# Patient Record
Sex: Male | Born: 1960 | Race: Black or African American | Hispanic: No | Marital: Single | Smoking: Former smoker
Health system: Southern US, Community
[De-identification: ages and names within clinical notes are randomized; demographics above are authoritative.]

## PROBLEM LIST (undated history)

## (undated) DIAGNOSIS — M25569 Pain in unspecified knee: Secondary | ICD-10-CM

## (undated) DIAGNOSIS — K5790 Diverticulosis of intestine, part unspecified, without perforation or abscess without bleeding: Secondary | ICD-10-CM

## (undated) DIAGNOSIS — D135 Benign neoplasm of extrahepatic bile ducts: Secondary | ICD-10-CM

## (undated) DIAGNOSIS — U071 COVID-19: Secondary | ICD-10-CM

## (undated) DIAGNOSIS — J45909 Unspecified asthma, uncomplicated: Secondary | ICD-10-CM

## (undated) DIAGNOSIS — K219 Gastro-esophageal reflux disease without esophagitis: Secondary | ICD-10-CM

## (undated) HISTORY — PX: KNEE ARTHROSCOPY: SHX127

## (undated) HISTORY — DX: Benign neoplasm of extrahepatic bile ducts: D13.5

## (undated) HISTORY — DX: Diverticulosis of intestine, part unspecified, without perforation or abscess without bleeding: K57.90

---

## 1999-02-07 ENCOUNTER — Emergency Department (HOSPITAL_COMMUNITY): Admission: EM | Admit: 1999-02-07 | Discharge: 1999-02-07 | Payer: Self-pay | Admitting: Emergency Medicine

## 1999-02-07 ENCOUNTER — Encounter: Payer: Self-pay | Admitting: Emergency Medicine

## 2000-09-09 ENCOUNTER — Encounter: Payer: Self-pay | Admitting: Emergency Medicine

## 2000-09-09 ENCOUNTER — Emergency Department (HOSPITAL_COMMUNITY): Admission: EM | Admit: 2000-09-09 | Discharge: 2000-09-09 | Payer: Self-pay | Admitting: *Deleted

## 2000-10-18 ENCOUNTER — Encounter: Admission: RE | Admit: 2000-10-18 | Discharge: 2000-10-18 | Payer: Self-pay | Admitting: Orthopedic Surgery

## 2000-10-18 ENCOUNTER — Encounter: Payer: Self-pay | Admitting: Orthopedic Surgery

## 2001-07-11 ENCOUNTER — Emergency Department (HOSPITAL_COMMUNITY): Admission: EM | Admit: 2001-07-11 | Discharge: 2001-07-11 | Payer: Self-pay | Admitting: Emergency Medicine

## 2001-10-18 ENCOUNTER — Encounter: Payer: Self-pay | Admitting: Gastroenterology

## 2001-10-18 ENCOUNTER — Inpatient Hospital Stay (HOSPITAL_COMMUNITY): Admission: EM | Admit: 2001-10-18 | Discharge: 2001-10-23 | Payer: Self-pay | Admitting: Emergency Medicine

## 2002-06-13 ENCOUNTER — Emergency Department (HOSPITAL_COMMUNITY): Admission: EM | Admit: 2002-06-13 | Discharge: 2002-06-14 | Payer: Self-pay | Admitting: Emergency Medicine

## 2004-02-22 ENCOUNTER — Emergency Department (HOSPITAL_COMMUNITY): Admission: EM | Admit: 2004-02-22 | Discharge: 2004-02-22 | Payer: Self-pay | Admitting: Emergency Medicine

## 2005-03-26 ENCOUNTER — Emergency Department (HOSPITAL_COMMUNITY): Admission: EM | Admit: 2005-03-26 | Discharge: 2005-03-26 | Payer: Self-pay | Admitting: *Deleted

## 2006-02-18 ENCOUNTER — Inpatient Hospital Stay (HOSPITAL_COMMUNITY): Admission: EM | Admit: 2006-02-18 | Discharge: 2006-02-21 | Payer: Self-pay | Admitting: Emergency Medicine

## 2006-02-18 ENCOUNTER — Ambulatory Visit: Payer: Self-pay | Admitting: Internal Medicine

## 2006-02-27 ENCOUNTER — Ambulatory Visit: Payer: Self-pay | Admitting: Internal Medicine

## 2006-08-19 ENCOUNTER — Observation Stay (HOSPITAL_COMMUNITY): Admission: EM | Admit: 2006-08-19 | Discharge: 2006-08-20 | Payer: Self-pay | Admitting: Emergency Medicine

## 2006-09-10 ENCOUNTER — Emergency Department (HOSPITAL_COMMUNITY): Admission: EM | Admit: 2006-09-10 | Discharge: 2006-09-10 | Payer: Self-pay | Admitting: Emergency Medicine

## 2006-10-04 ENCOUNTER — Emergency Department (HOSPITAL_COMMUNITY): Admission: EM | Admit: 2006-10-04 | Discharge: 2006-10-04 | Payer: Self-pay | Admitting: Emergency Medicine

## 2007-03-03 ENCOUNTER — Emergency Department (HOSPITAL_COMMUNITY): Admission: EM | Admit: 2007-03-03 | Discharge: 2007-03-03 | Payer: Self-pay | Admitting: Emergency Medicine

## 2007-06-27 ENCOUNTER — Ambulatory Visit: Payer: Self-pay | Admitting: Internal Medicine

## 2007-07-15 ENCOUNTER — Ambulatory Visit: Payer: Self-pay | Admitting: Internal Medicine

## 2007-07-15 LAB — CONVERTED CEMR LAB
ALT: 15 units/L (ref 0–53)
Albumin: 4.4 g/dL (ref 3.5–5.2)
BUN: 15 mg/dL (ref 6–23)
HDL: 40 mg/dL (ref >39)
Sodium: 138 meq/L (ref 135–145)
Total Bilirubin: 0.4 mg/dL (ref 0.3–1.2)
Total Protein: 8.2 g/dL (ref 6.0–8.3)
Triglycerides: 209 mg/dL — ABNORMAL HIGH (ref <150)
VLDL: 42 mg/dL — ABNORMAL HIGH (ref 0–40)

## 2007-07-16 ENCOUNTER — Ambulatory Visit: Payer: Self-pay | Admitting: *Deleted

## 2007-11-17 ENCOUNTER — Emergency Department (HOSPITAL_COMMUNITY): Admission: EM | Admit: 2007-11-17 | Discharge: 2007-11-17 | Payer: Self-pay | Admitting: Physician Assistant

## 2007-12-25 ENCOUNTER — Ambulatory Visit: Payer: Self-pay | Admitting: Internal Medicine

## 2007-12-26 ENCOUNTER — Ambulatory Visit (HOSPITAL_COMMUNITY): Admission: RE | Admit: 2007-12-26 | Discharge: 2007-12-26 | Payer: Self-pay | Admitting: Internal Medicine

## 2008-07-06 ENCOUNTER — Emergency Department (HOSPITAL_COMMUNITY): Admission: EM | Admit: 2008-07-06 | Discharge: 2008-07-06 | Payer: Self-pay | Admitting: Emergency Medicine

## 2008-08-03 ENCOUNTER — Ambulatory Visit: Payer: Self-pay | Admitting: Internal Medicine

## 2008-09-29 ENCOUNTER — Ambulatory Visit: Payer: Self-pay | Admitting: Internal Medicine

## 2009-02-27 ENCOUNTER — Emergency Department (HOSPITAL_COMMUNITY): Admission: EM | Admit: 2009-02-27 | Discharge: 2009-02-27 | Payer: Self-pay | Admitting: Emergency Medicine

## 2009-03-28 ENCOUNTER — Emergency Department (HOSPITAL_COMMUNITY): Admission: EM | Admit: 2009-03-28 | Discharge: 2009-03-28 | Payer: Self-pay | Admitting: Emergency Medicine

## 2009-07-21 ENCOUNTER — Emergency Department (HOSPITAL_COMMUNITY): Admission: EM | Admit: 2009-07-21 | Discharge: 2009-07-22 | Payer: Self-pay | Admitting: Emergency Medicine

## 2009-12-18 ENCOUNTER — Emergency Department (HOSPITAL_COMMUNITY): Admission: EM | Admit: 2009-12-18 | Discharge: 2009-12-18 | Payer: Self-pay | Admitting: Emergency Medicine

## 2009-12-21 ENCOUNTER — Emergency Department (HOSPITAL_COMMUNITY): Admission: EM | Admit: 2009-12-21 | Discharge: 2009-12-21 | Payer: Self-pay | Admitting: Emergency Medicine

## 2010-10-03 ENCOUNTER — Encounter: Payer: Self-pay | Admitting: Emergency Medicine

## 2010-12-11 ENCOUNTER — Emergency Department (HOSPITAL_COMMUNITY)
Admission: EM | Admit: 2010-12-11 | Discharge: 2010-12-12 | Disposition: A | Discharge status: Home / Self Care | Payer: Self-pay | Attending: Emergency Medicine | Admitting: Emergency Medicine

## 2010-12-11 DIAGNOSIS — L0231 Cutaneous abscess of buttock: Secondary | ICD-10-CM | POA: Insufficient documentation

## 2010-12-14 LAB — WOUND CULTURE

## 2010-12-18 LAB — BASIC METABOLIC PANEL
CO2: 30 mEq/L (ref 19–32)
Chloride: 103 mEq/L (ref 96–112)
GFR calc Af Amer: >60 mL/min (ref >60)
Sodium: 139 mEq/L (ref 135–145)

## 2010-12-18 LAB — CBC
MCV: 92.6 fL (ref 78.0–100.0)
RBC: 4.53 MIL/uL (ref 4.22–5.81)
RDW: 12.8 % (ref 11.5–15.5)

## 2010-12-18 LAB — DIFFERENTIAL
Basophils Absolute: 0 10*3/uL (ref 0.0–0.1)
Eosinophils Relative: 2 % (ref 0–5)
Neutro Abs: 3.9 10*3/uL (ref 1.7–7.7)
Neutrophils Relative %: 55 % (ref 43–77)

## 2010-12-18 LAB — POCT CARDIAC MARKERS
CKMB, poc: <1 ng/mL — ABNORMAL LOW (ref 1.0–8.0)
Myoglobin, poc: 33.2 ng/mL (ref 12–200)

## 2011-01-27 NOTE — H&P (Signed)
Zachary, [name] NO.:  [id number]   MEDICAL RECORD NO.:  [id number]          PATIENT TYPE:  INP   LOCATION:  2007                         FACILITY:  [facility]   PHYSICIAN:  Vesta Mixer, M.D. DATE OF BIRTH:  10-Oct-1960   DATE OF ADMISSION:  08/19/2006  DATE OF DISCHARGE:                              HISTORY & PHYSICAL   Zachary [name] is a 50 year old gentleman with history of chest pains  in the past.  He was admitted through the emergency room with a code  STEMI.   Zachary [name] has a history of chest pains in the past.  He was seen by Dr.  Gala Romney this past summer.  He had a stress Cardiolite study which was  negative for ischemia.   The patient presented to the emergency room with a very vague history of  four days of atypical chest pain.  He states that the pain has been with  him for at least three to four days, although he could remember when it  started.  It was not all that severe.  He denies any shortness breath.  He used to smoke but quit smoking in June.  He does not get any regular  exercise, so could not comment on the exertional component.   An EKG revealed what appeared to be early repolarization changes.  These  changes were not present on an EKG in June and a code STEMI was called.   The patient denies any syncope or presyncope.  He denies any fever or  sputum production.   CURRENT MEDICATIONS:  None.   ALLERGIES:  None.   PAST MEDICAL HISTORY:  1. History of knee surgery.  2. History of chest pain.  He was seen by Dr. Gala Romney and had a      negative Cardiolite study in June 2007.   SOCIAL HISTORY:  The patient used to smoke but quit in June of 2007.  His family history is unobtainable.   REVIEW OF SYSTEMS:  The patient is a very vague historian.   On exam, he is a young gentleman in no acute distress.  He is alert and  oriented x3 and is mood and affect are normal.  His blood pressure is  162/84 with heart rate of 64.  His  HEENT exam reveals 2+ carotids.  He  has no bruits, no JVD, no thyromegaly.  Lungs are clear to auscultation.  Heart regular rate, S1-S2, no murmurs.  Abdominal exam reveals good  bowel sounds and is nontender.  Extremities has no clubbing, cyanosis or  edema.  Neuro exam is nonfocal.   His EKG reveals normal sinus rhythm.  He has early repolarization  changes.  His old EKG reveals normal sinus rhythm with T-wave inversions  in the inferior leads.   His laboratory data is pending.   Zachary [name] is admitted with a four-day history of vague chest pain.  Code ST-segment elevation myocardial infarction was called.  We will  admit him for further evaluation and possible heart catheterization.  We  have discussed the risks, benefits and options of heart catheterization.  He understands and agrees  to proceed.           ______________________________  Vesta Mixer, M.D.     PJN/MEDQ  D:  08/19/2006  T:  08/20/2006  Job:  [id number]   cc:   Dala Dock

## 2011-01-27 NOTE — Discharge Summary (Signed)
[place]  Patient:    Zachary [name], Zachary [name] Visit Number: [id number] MRN: [id number]          Service Type: MED Location: 2000 2002 01 Attending Physician:  Zachary [name] Dictated by:   Zachary [name], P.A.-C. Admit Date:  10/18/2001 Disc. Date: 10/23/01   CC:         Zachary [name], M.D. [facility]   Referring Physician Discharge [place]  DATE OF BIRTH:  [birth year]  SUMMARY OF HISTORY:  Zachary [name] is a 50 year old black male, who presented to the emergency room for evaluation of epigastric discomfort associated with nausea, vomiting, and shortness of breath.  He has felt that his symptoms had worsened over the last few days.  He also describes a productive cough of clear to yellow sputum but denies hemoptysis.  He was seen at [facility] Emergency Room 3-4 days prior to this presentation and diagnosed with bronchitis and placed on amoxicillin.  He has had developed diarrhea since that time.  He also describes orthopnea and PND.  His symptoms seem to be worse with exertion and with eating and improved with rest.  He was seen at Urgent Care today and referred to the emergency room secondary to EKG changes. He quit smoking approximately one week ago secondary to shortness of breath. He drinks at least 3-4 beers per day and denies drug use.  LABORATORY DATA:  C-reactive protein was .7.  C. difficile is still pending at this dictation.  Ferritin 250.  Lipids show a total cholesterol of 139, triglycerides 48, HDL 42, LDL 87.  CK totals were slightly elevated with negative MBs and relative indexes. Troponins were also slightly elevated at .22, .19, and .17.  Admission sodium was 140, potassium 4.1, BUN 13, creatinine 1.3, AST and ALT were slightly elevated at 56 and 78.  Magnesium was 1.9, amylase 32, lipase 16.  Subsequent chemistries were essentially unremarkable.  Admission H&H was 15.8 and 47.2, normal indices, platelets 253, WBC 4.3.  PT 15.6,  PTT 31.  ABG showed a pH of 7.33, pCO2 45.7, and a pO2 of 32, with O2 saturations 57 (probably venous sample).  Chest x-ray on admission showed cardiomegaly compared to Feb 07, 1999.  Mild increased perihilar markings suggesting either early edema or interstitial pneumonitis.  Echocardiogram performed on October 18, 2001, showed an EF of approximately 15%, mild left atrial dilatation, right ventricle dilatation, extreme LV dysfunction, and RV dysfunction.  EKG showed normal sinus rhythm, poor R wave progression, left atrial enlargement.  Subsequent EKGs showed improvement of R wave progression.  HOSPITAL COURSE:  Zachary [name] was admitted to the unit 2000 to be treated for congestive heart failure, possible ischemia.  Echocardiogram was obtained.  He was placed on medications for diuresis.  Echocardiogram showed an EF of 10-15%, and Zachary [name] discussed these results with the patient and his mother and possible etiologies.  It was noted that the patient does admit to binge drinking a couple of times per week, a 12-pack and liquor.  It was felt that his troponins were slightly increased secondary to CHF.  Medications were adjusted.  Cardiac catheterization was performed on October 21, 2001.  This did not show any coronary artery disease.  Zachary [name] notes on October 22, 2001, that she discussed with Zachary [name] about his nonischemic cardiomyopathy.  No history of syncope or widened QRS.  Thus, his CHF will be treated medically without any indication for EP evaluation at this point. October 23, 2001, Zachary [name] saw the patient and felt that he could be discharged home.  DISCHARGE DIAGNOSES: 1. Dilated cardiomyopathy. 2. Congestive heart failure secondary to above. 3. Alcohol use. 4. Remote tobacco use.  DISPOSITION:  He is discharged home.  DISCHARGE MEDICATIONS: 1. Lasix 60 mg q.d. 2. Mavik 1 mg q.d. 3. Toprol XL 35 mg q.d. 4. Aldactone 25 mg q.d. 5. He was instructed he  could continue his Prevacid 30 mg q.d. and albuterol    as previously.  INSTRUCTIONS:  He was advised no lifting, driving, sexual activity, or heavy exertion for two days.  Maintain low salt diet, less than 2000 mg per day, low fat, low cholesterol diet.  If he had any problems with the catheterization site, he was asked to call immediately.  He was advised no alcohol or tobacco use whatsoever.  He was instructed to weigh daily, write these down, and bring his medications and weights to all office appointments.  He will have a BNP drawn the week of November 11, 2001, at our office, and will see Zachary [name] on Thursday, November 14, 2001, at 11:15. Dictated by:   Zachary [name], P.A.-C. Attending Physician:  Zachary [name] DD:  10/23/01 TD:  10/23/01 Job: [id number]

## 2011-01-27 NOTE — H&P (Signed)
NAME:  Zachary [name], Zachary [name]            ACCOUNT NO.:  [id number]   MEDICAL RECORD NO.:  [id number]          PATIENT TYPE:  INP   LOCATION:  1824                         FACILITY:  [facility]   PHYSICIAN:  Della Goo, M.D. DATE OF BIRTH:  10-Feb-1961   DATE OF ADMISSION:  02/18/2006  DATE OF DISCHARGE:                                HISTORY & PHYSICAL   PRIMARY CARE PHYSICIAN:  Unassigned.  This is an admission to the [facility]  Hospitalist group team A.   CHIEF COMPLAINT:  Chest pain.   HISTORY OF PRESENT ILLNESS:  This is a 50 year old male with a remote  history of asthma who presented to the emergency department secondary to  complaints of chest pain radiating into his left arm.  The patient reports  this pain began while he was at a picnic.  The pain was associated with  exertion.  The pain radiated up and down his left arm.  He denies any  association of nausea, vomiting, diaphoresis or shortness of breath  associated with the pain.  He reports the pain was dull, substernal and  rated at a quality of 6/10.  He reports the pain lasted two hours and did  not resolve until he arrived in the emergency department and was  administered nitroglycerin x1.   The patient does have a family history of CAD.  His father and his maternal  grandfather had coronary artery disease.  The patient also reports other  members of his mother's family as well have coronary artery disease,  hypertension and diabetes.  He is a smoker; smokes one pack per day for over  32 years.   PAST MEDICAL HISTORY:  As mentioned above, asthma.   MEDICATIONS:  None.   ALLERGIES:  No known drug allergies.   SOCIAL HISTORY:  The patient lives with his father.  Works as a Designer, fashion/clothing.  Positive tobacco one pack per day x32 years.  Quit alcohol several years  ago.  Reports drinking approximately a case per day of beer.   FAMILY HISTORY:  Mentioned above.  Also sister with diabetes and cancer  history in maternal  grandfather, unknown type.   REVIEW OF SYSTEMS:  Pertinent as mentioned above.   PHYSICAL EXAMINATION:  GENERAL:  This is a thin, well-developed, well-  nourished male in no discomfort and in no acute distress at this time.  VITAL SIGNS:  Temperature 98.3, blood pressure 127/81, heart rate 70 and  respirations 18.  O2 saturation 94% on room air.  HEENT:  Normocephalic and atraumatic.  Pupils are equal, round and reactive  to light.  Extraocular muscles are intact.  No scleral icterus.  Funduscopic  examination has no exudate, hemorrhages or AV nicking.  Tympanic membranes  clear bilaterally.  Oropharynx clear.  Sparse lower mandibular dentition.  Edentulous on the upper palate.  NECK:  Supple.  Full range of motion.  No jugular venous distention.  No  carotid bruits.  No adenopathy.  No thyromegaly.  CHEST:  Chest wall examination has a nondisplaced PMI.  No chest wall  tenderness.  Regular rate and rhythm.  Mild bradycardia.  No murmurs,  gallops, or rubs.  LUNGS:  Clear to auscultation with occasional expiratory wheezes.  ABDOMEN:  Positive bowel sounds.  Soft, nontender, and nondistended.  Scaphoid abdomen.  No hepatosplenomegaly.  RECTAL/GENITOURINARY:  Deferred.  BACK:  No CVA tenderness.  No spinous process tenderness.  EXTREMITIES:  No clubbing, cyanosis, or edema.  MUSCULOSKELETAL:  5/5 strength throughout.  Full range of motion.  No  atrophy.  VASCULAR:  Peripheral pulses 2+ and symmetric.  NEUROLOGICAL:  The patient is alert and oriented x3.  Cranial nerves II  through XII intact.  There are no focal deficits.  Motor and sensory  function intact.  Cerebellar function intact.  Deep tendon reflexes are 2/4  bilaterally.   LABORATORY DATA:  Hemoglobin 16.3, hematocrit 48.0.  Chemistry revealing a  sodium of 139, potassium 3.5, chloride 106, BUN 9, bicarb 26, creatinine  1.0.  Cardiac enzymes:  Myoglobin 58.9, CK-MB 1.2, troponin less than 0.05.  Glucose 94.  EKG reveals a  sinus bradycardia.  No acute ST segment changes.  Chest x-ray findings are negative for any acute infiltrates, effusions or  masses.  There is mild bronchial thickening apparent.   ASSESSMENT:  1.  Chest pain:  This is a 50 year old male with risk factors for coronary      artery disease who had chest pain unrelieved until administration of      nitroglycerin x1 today in the emergency department.  The patient has      been admitted for rule out and acute myocardial event.  2.  Asthma.  3.  Tobacco abuse.   PLAN:  The patient has been admitted to telemetry for cardiac monitoring.  Cardiac enzymes will be performed q.8h. x3.  An EKG will also be performed  in the a.m.  The patient will be placed on aspirin therapy, nitroglycerin  therapy along with Lovenox therapy until enzymes return negative x3.  The  patient will be placed on GI prophylaxis with Protonix.  The patient has  been placed on Xopenex nebulizer treatments for his asthma.  The patient  will be placed on a nicotine patch while hospitalized.  He has been  counseled in regard to his smoking.      Della Goo, M.D.  Electronically Signed     HJ/MEDQ  D:  02/18/2006  T:  02/18/2006  Job:  [id number]

## 2011-01-27 NOTE — Cardiovascular Report (Signed)
NAME:  [name], [name] NO.:  [id number]   MEDICAL RECORD NO.:  [id number]          PATIENT TYPE:  INP   LOCATION:  2007                         FACILITY:  [facility]   PHYSICIAN:  Vesta Mixer, M.D. DATE OF BIRTH:  [birth year]   DATE OF PROCEDURE:  08/19/2006  DATE OF DISCHARGE:                            CARDIAC CATHETERIZATION   Zachary [name] is a 50 year old gentleman with a history of chest  pains in the past.  He had a stress Cardiolite study in June which was  negative.  He saw Dr. Gala Romney at the time.   He presented today with a 4-day history of intermittent chest pain.  The  symptoms were quite vague.  He had an EKG which revealed mild ST-segment  depression which appeared to be most consistent with early  repolarization.  These changes were however new from his June EKG which  revealed T-wave inversions in the inferior leads.   Because of these changes, he was referred for heart catheterization and  possible angioplasty.   The risks, benefits and options were explained to the patient.  He  understands and agrees to proceed.   The procedure was a left heart catheterization with coronary  angiography.  The right femoral artery was easily cannulated using the  modified Seldinger technique.   HEMODYNAMICS:  The LV pressure is 139/3 with an aortic pressure of  140/81.   ANGIOGRAPHY:  Left main:  The left main is smooth and normal.   The left anterior descending artery is smooth and normal.  The first  diagonal artery is a moderate-sized branch which is also normal.  The  second diagonal branch is a little bit larger branch which is also  normal.   The left circumflex artery is a fairly large system.  It gives off a  very high obtuse marginal artery which is normal.  The remainder of the  left circumflex artery is fairly normal.   The right coronary artery is smooth and normal and is dominant.  The  posterior descending artery and the  posterolateral segment artery are  normal.   COMPLICATIONS:  None.   CONCLUSIONS:  1. Fairly smooth and normal coronary arteries.  2. Normal left ventricular systolic function.  The patient will be      ready for discharge tomorrow.  We will observe him overnight.           ______________________________  Vesta Mixer, M.D.     PJN/MEDQ  D:  08/19/2006  T:  08/20/2006  Job:  [id number]   cc:   Dala Dock

## 2011-01-27 NOTE — Discharge Summary (Signed)
Zachary [name], Zachary [name]            ACCOUNT NO.:  [id number]   MEDICAL RECORD NO.:  [id number]          PATIENT TYPE:  INP   LOCATION:  2007                         FACILITY:  [facility]   PHYSICIAN:  Vesta Mixer, M.D. DATE OF BIRTH:  [birth year]   DATE OF ADMISSION:  08/19/2006  DATE OF DISCHARGE:  08/20/2006                               DISCHARGE SUMMARY   DISCHARGE DIAGNOSIS:  Noncardiac chest pain.   DISCHARGE MEDICATIONS:  Aspirin 81 mg a day.   DISPOSITION:  The patient will follow up with [facility].   HISTORY OF PRESENT ILLNESS:  Zachary [name] is a 50 year old gentleman who  came to the ER with four days of intermittent chest pain. He was  admitted to the cardiology service with a code semi.   He had been complaining of four days of intermittent chest pain. The  chest pain was fairly vague and was somewhat atypical. His EKG showed  early repolarization changes.   HOSPITAL COURSE BY PROBLEMS:  Chest pain. The patient's chest pain was  quite vague. Code semi was called. He was taken to the cath lab. He was  found to have fairly smooth and normal coronary arteries. His left  ventricular systolic function was normal.   The patient did well and did not have any further episodes of chest  pain. Cardiac enzymes were negative x1. Further enzymes were not drawn  because he has smooth and normal coronary arteries and normal left  ventricular systolic function.   The patient will follow up at [facility]. He has been instructed to not  lift anything heavy for the next couple days.           ______________________________  Vesta Mixer, M.D.     PJN/MEDQ  D:  08/20/2006  T:  08/20/2006  Job:  [id number]   cc:   Benetta Spar R. Rankins, M.D.

## 2011-01-27 NOTE — Cardiovascular Report (Signed)
[place]. [facility]  Patient:    Zachary [name], Zachary [name] Visit Number: [id number] MRN: [id number]          Service Type: MED Location: 2000 2002 01 Attending Physician:  Pricilla Riffle Dictated by:   Lewayne Bunting, M.D. [facility] Proc. Date: 10/22/01 Admit Date:  10/18/2001   CC:         Dietrich Pates, M.D. [facility]   Cardiac Catheterization  DATE OF BIRTH:  [birth year].  REFERRING PHYSICIAN:  Dietrich Pates, M.D.  PROCEDURES PERFORMED: 1. Left and right heart catheterization. 2. Selective coronary angiography.  DIAGNOSES: 1. Nonischemic cardiomyopathy. 2. Low output state. 3. Markedly elevated left ventricular end-diastolic pressure. 4. Mild pulmonary hypertension.  INDICATIONS:  The patient is a 50 year old male with a history of alcohol use and tobacco use.  The patient has no prior history of coronary artery disease. He was admitted with shortness of breath and abdominal pain.  A 2-D echocardiographic study revealed severe left ventricular systolic dysfunction. The patient has now been referred for diagnostic catheterization to assess his coronary anatomy and to determine hemodynamics.  He also has had nonsustained ventricular tachycardia and will require EP evaluation.  DESCRIPTION OF PROCEDURE:  After informed consent was obtained, the patient was brought to the catheterization laboratory.  The right groin was sterilely prepped and draped.  One percent Lidocaine injected.  A 6-French arterial sheath was placed in the right femoral artery and a 7-French venous sheath was placed in the femoral vein according to the modified Seldinger technique.  Subsequently, a Swan-Ganz catheter was placed in the pulmonary artery and appropriate right-sided hemodynamics were obtained.  Concomitant left and right sided hemodynamics were obtained after a 6-French pigtail catheter was placed in the left ventricle.  Appropriate left-sided hemodynamics were then also obtained.   No ventriculography was performed due to the fact of the patients underlying renal insufficiency. Both the pigtail and the Swan-Ganz catheter were then removed.  Attention was then turned to placing a 6-French JL-4 and JR-4 catheter in the left and right ostium respectively.  Selective coronary angiography was performed in the various projections using manual injections of contrast.  At the termination of the procedure, all catheters and sheaths were removed and the patient was brought back to the holding area.  No complications were noted.  Adequate hemostasis was provided.  FINDINGS:  HEMODYNAMICS: 1. Right atrial pressure is 15 mmHg. 2. Right ventricular pressure is 42/10 mmHg. 3. PA pressure is 42/28 mmHg. 4. Pulmonary capillary wedge pressure:  A-wave 25 mmHg, V-wave 30 mmHg. 5. Left ventricular pressure is 108/30 mmHg. 6. Aortic pressure is 108/84 mmHg. 7. Cardiac output by thermodilution was 3.7 L/min. 8. Fick cardiac output was 3.0 L/min; Fick cardiac index was 1.7 L/min.  VENTRICULOGRAPHY:  Not performed.  SELECTIVE CORONARY ANGIOGRAPHY:  The coronary circulation was right dominant.  The left main coronary artery was a large caliber vessel free of flow limiting coronary artery disease.  The left anterior descending artery was a large caliber vessel giving rise to three diagonal branches, with the most proximal being the largest.  There was no evidence of flow limiting coronary artery disease.  The circumflex coronary artery was again a large caliber vessel with no evidence of flow limiting coronary artery disease.  The circumflex gave rise to a very large first obtuse marginal branch which was bifurcating towards the apex and providing blood flow to inferobasilar segment.  The circumflex proper also is free of flow limiting coronary artery  disease.  The right coronary artery was a large caliber vessel terminating in the posterior descending artery and a small  posterolateral branch.  There was no evidence of flow limiting coronary artery disease.  RECOMMENDATIONS:  Continued medical therapy is in order.  There is no evidence of flow limiting coronary artery disease.  The patient will need aggressive treatment of his heart failure symptoms.  As per Dr. Tenny Craw request, an EP evaluation has been ordered. Dictated by:   Lewayne Bunting, M.D. [facility] Attending Physician:  Pricilla Riffle DD:  10/22/01 TD:  10/22/01 Job: [id number]

## 2011-01-27 NOTE — Consult Note (Signed)
NAME:  Zachary [name], Zachary [name]            ACCOUNT NO.:  [id number]   MEDICAL RECORD NO.:  [id number]          PATIENT TYPE:  INP   LOCATION:  3731                         FACILITY:  [facility]   PHYSICIAN:  Arvilla Meres, M.D. LHCDATE OF BIRTH:  11/17/1960   DATE OF CONSULTATION:  02/19/2006  DATE OF DISCHARGE:                                   CONSULTATION   CARDIOLOGIST:  New to [facility].   REFERRING PHYSICIAN:  Isidor Holts, M.D.   REASON FOR CONSULTATION:  Chest pain.   HISTORY OF PRESENT ILLNESS:  [name] is a 50 year old male with no  known history of coronary artery disease.  He has never had a stress test or  cardiac catheterization.  He does have a history of asthma as well with  ongoing tobacco use.  He denies any history of diabetes, hypertension, or  hyperlipidemia.  He states that on Sunday, after smoking a cigarette, he  came back inside the house and developed some mild burning in his chest.  This was followed by tingling and burning in his left leg which radiated all  the way up his left side into his left arm.  He presented to the emergency  room. He was treated with nitroglycerin with resolution of his symptoms.  His EKG was normal, and subsequently he has had four sets of normal cardiac  enzymes.  He is quite active as a Designer, fashion/clothing and denies any exertional chest  pain or shortness of breath.  There has been no change in his functional  capacity.  He has not had any heart failure symptoms. There have been no  palpitations, syncope or presyncope.  He denies any significant reflux  disease.   REVIEW OF SYSTEMS:  As per History of Present Illness and Problem List.  Otherwise, all systems are negative.   PAST MEDICAL HISTORY:  1.  Asthma.  2.  Ongoing tobacco use.  3.  History of alcohol use but now quit.   There is no history of known coronary artery disease, diabetes,  hypertension, or hyperlipidemia.   MEDICATIONS ON ADMISSION:  None.   ALLERGIES:   None.   SOCIAL HISTORY:  He works as a Designer, fashion/clothing, lives in [place] with his father.  Tobacco: 1 pack per day x30 years.  He has a history of alcohol but quit.   FAMILY HISTORY:  Mother is alive in her 1s, recently diagnosed with  diabetes.  Father passed away in his 15s due to throat cancer.  One sister  who does not have a history of heart disease, one brother who is okay, one  brother who died from motor vehicle accident.  There was a maternal  grandfather who died from congestive heart failure.  There is no family  history of premature coronary artery disease.   PHYSICAL EXAMINATION:  GENERAL:  He is well-appearing in no acute distress.  He is lying flat in bed.  VITAL SIGNS: Blood pressure is 112/69. His heart rate is 70.  His  temperature is 99.5.  He is saturating 93% on room air.  HEENT:  Sclerae anicteric.  EOMI.  There is no  xanthelasmas.  Mucous  membranes are moist.  NECK: Supple.  There is no JVD.  Carotid 2+ bilaterally without bruits.  There is no lymphadenopathy or thyromegaly.  CARDIAC:  He is a regular rate and rhythm with no obvious murmurs, rubs or  gallops.  LUNGS: Have diffuse expiratory wheezing throughout.  ABDOMEN:  Soft, nontender, nondistended.  No hepatosplenomegaly, no bruits.  No masses.  Normal bowel sounds.  EXTREMITIES:  Warm with no cyanosis, clubbing or edema.  Femoral pulses are  2+ bilaterally without bruits.  DP pulses are 2+ bilaterally.  NEUROLOGIC: He is alert and oriented x3.  Cranial nerves II-XII are intact,  and he moves all four extremities without difficulty.   EKG shows normal sinus rhythm with no significant ST-T wave changes, a rate  of 69.   Labs show white count of 4.7, hemoglobin of 13.7, platelets 229.  INR is  1.1. Sodium is 138, potassium 3.5, BUN 8, creatinine 0.8, glucose 120.  Troponin is 0.02 x2. He also has two sets of negative point-of-care markers.  MBs are negative.  Total cholesterol is  163, triglycerides 98, HDL  37, LDL  106.   Chest x-ray shows no active cardiopulmonary disease.   ASSESSMENT:  1.  Chest pain, primarily atypical, though with response to nitroglycerin.      He has ruled out for myocardial infarction with serial cardiac enzymes      and a normal EKG.  2.  Asthma with ongoing wheezing on exam and a low-grade temperatures,      question chronic obstructive pulmonary disease flare versus asthmatic      bronchitis.  3.  No history of diabetes, hypertension, hyperlipidemia.   PLAN/DISCUSSION:  I suspect this is noncardiac.  However, given his response  to nitroglycerin and mild risk factors, I think he would deserve an  outpatient stress test, and we will schedule a treadmill Myoview for later  this week.  Would also recommend smoking cessation and using aspirin 81 mg a  day as well as treatment of his asthma/COPD.  We will provide you with the  date of the stress test in the chart.  We appreciate the consult, and please  do not hesitate to contact us with questions.  He would be stable for  discharge from our standpoint today.      Arvilla Meres, M.D. [facility]  Electronically Signed     DB/MEDQ  D:  02/19/2006  T:  02/19/2006  Job:  [id number]

## 2011-01-27 NOTE — Discharge Summary (Signed)
NAMETYE, VIGO            ACCOUNT NO.:  [id number]   MEDICAL RECORD NO.:  [id number]          PATIENT TYPE:  INP   LOCATION:  3731                         FACILITY:  [facility]   PHYSICIAN:  Isidor Holts, M.D.  DATE OF BIRTH:  May 24, 1961   DATE OF ADMISSION:  02/18/2006  DATE OF DISCHARGE:  02/21/2006                                 DISCHARGE SUMMARY   PRIMARY MEDICAL DOCTOR:  Gentry Fitz.   DISCHARGE DIAGNOSES:  1.  Atypical chest pain.  2.  Asthma/chronic obstructive pulmonary disease.  3.  Smoker.   DISCHARGE MEDICATIONS:  1.  Albuterol inhaler two puffs p.r.n. q.4-6 h.  2.  Advair Diskus (100/50) one puff b.i.d.   PROCEDURES:  1.  Two-view chest x-ray dated February 18, 2006:  This showed no acute disease,      possible minimal bronchial thickening.  2.  Stress Myoview dated February 20, 2006:  This showed slight fixed decreased      activity inferiorly with decreased motion and thickening in this area.      This could represent an areas of scar or diaphragmatic attenuation, no      ischemia, ejection fraction 52%.   CONSULTATIONS:  Dr. Arvilla Meres, [facility].   ADMISSION HISTORY:  As in H&P notes of February 18, 2006, dictated by Dr.  Della Goo. However, in brief, this is a 50 year old male, with known  history of bronchial asthma, also smoker, who presents with an episode of  retrosternal chest pain radiating into his left arm, which developed while  he was at a picnic.  Apparently pain lasted for 2 hours, and was relieved by  nitroglycerin in the emergency department.  The patient has a family history  of coronary artery disease and certainly appeared to have risk factors for  CAD, being a smoker. He was therefore admitted for further evaluation,  investigation and management.   CLINICAL COURSE:  PROBLEM #1 - CHEST PAIN:  The patient's symptoms appeared  initially to indicate possible coronary artery disease.  He was therefore  managed with  Nitroglycerin and Aspirin, as well as therapeutic doses of  Lovenox.  The patient responded to initial nitroglycerin in the emergency  department and had no relapse of symptoms thereafter.  Cardiac enzymes were  cycled and remained unelevated.  EKG showed no acute ischemic changes.  We  were able to discontinue the patient's nitroglycerin and Lovenox without any  relapse of symptoms.  Cardiology consultation was called, which was kindly  provided by Dr. Arvilla Meres.  The patient underwent stress Myoview on  February 20, 2006, which showed no evidence of ischemia, chest pain was  therefore deemed to be atypical and not related to coronary artery disease.   PROBLEM #2 - HISTORY OF BRONCHIAL ASTHMA:  The patient has a known history  of bronchial asthma.  He is not currently on any medications.  He is also a  smoker and on initial evaluation, was found to have expiratory rhonchi in  the chest.  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 He was therefore, managed with bronchodilator nebulizers  with  satisfactory clinical response and we are able to switch him to inhaled  bronchodilators, as well as inhaled steroids, which we have advised the  patient to continue.   PROBLEM #3 - SMOKING HISTORY:  The patient has been counseled accordingly,  and is determined to quit.   DISPOSITION:  The patient was considered sufficiently recovered and  clinically stable to be discharged on February 21, 2006.   DIET:  No restrictions.   ACTIVITY:  No restrictions.   WOUND CARE:  Not applicable.   PAIN MANAGEMENT:  Not applicable.   FOLLOWUP INSTRUCTIONS:  The patient does not currently have a primary MD. He  has been strongly recommended to establish one, for routine and preventative  care.  He has verbalized understanding.      Isidor Holts, M.D.  Electronically Signed     CO/MEDQ  D:  02/21/2006  T:  02/21/2006  Job:  [id number]   cc:   Arvilla Meres, M.D.  [facility]  [employer]  [address]  [place]  [place] [address]

## 2011-02-13 ENCOUNTER — Emergency Department (HOSPITAL_COMMUNITY)
Admission: EM | Admit: 2011-02-13 | Discharge: 2011-02-13 | Disposition: A | Discharge status: Home / Self Care | Payer: Self-pay | Attending: Emergency Medicine | Admitting: Emergency Medicine

## 2011-02-13 DIAGNOSIS — L0231 Cutaneous abscess of buttock: Secondary | ICD-10-CM | POA: Insufficient documentation

## 2011-02-15 ENCOUNTER — Emergency Department (HOSPITAL_COMMUNITY)
Admission: EM | Admit: 2011-02-15 | Discharge: 2011-02-15 | Disposition: A | Discharge status: Home / Self Care | Payer: Self-pay | Attending: Emergency Medicine | Admitting: Emergency Medicine

## 2011-02-15 DIAGNOSIS — L03317 Cellulitis of buttock: Secondary | ICD-10-CM | POA: Insufficient documentation

## 2011-02-15 DIAGNOSIS — L0231 Cutaneous abscess of buttock: Secondary | ICD-10-CM | POA: Insufficient documentation

## 2011-04-18 ENCOUNTER — Emergency Department (HOSPITAL_COMMUNITY)
Admission: EM | Admit: 2011-04-18 | Discharge: 2011-04-18 | Disposition: A | Discharge status: Home / Self Care | Payer: Self-pay | Attending: Emergency Medicine | Admitting: Emergency Medicine

## 2011-04-18 DIAGNOSIS — J45909 Unspecified asthma, uncomplicated: Secondary | ICD-10-CM | POA: Insufficient documentation

## 2011-04-18 DIAGNOSIS — IMO0002 Reserved for concepts with insufficient information to code with codable children: Secondary | ICD-10-CM | POA: Insufficient documentation

## 2011-04-18 DIAGNOSIS — X58XXXA Exposure to other specified factors, initial encounter: Secondary | ICD-10-CM | POA: Insufficient documentation

## 2011-04-18 DIAGNOSIS — L989 Disorder of the skin and subcutaneous tissue, unspecified: Secondary | ICD-10-CM | POA: Insufficient documentation

## 2011-06-13 ENCOUNTER — Emergency Department (HOSPITAL_COMMUNITY)
Admission: EM | Admit: 2011-06-13 | Discharge: 2011-06-14 | Disposition: A | Discharge status: Home / Self Care | Payer: Self-pay | Attending: Emergency Medicine | Admitting: Emergency Medicine

## 2011-06-13 ENCOUNTER — Emergency Department (HOSPITAL_COMMUNITY): Payer: Self-pay

## 2011-06-13 DIAGNOSIS — M25569 Pain in unspecified knee: Secondary | ICD-10-CM | POA: Insufficient documentation

## 2011-06-13 DIAGNOSIS — Z96659 Presence of unspecified artificial knee joint: Secondary | ICD-10-CM | POA: Insufficient documentation

## 2011-06-25 ENCOUNTER — Emergency Department (HOSPITAL_COMMUNITY)
Admission: EM | Admit: 2011-06-25 | Discharge: 2011-06-26 | Disposition: A | Discharge status: Home / Self Care | Payer: Self-pay | Attending: Emergency Medicine | Admitting: Emergency Medicine

## 2011-06-25 DIAGNOSIS — M25469 Effusion, unspecified knee: Secondary | ICD-10-CM | POA: Insufficient documentation

## 2011-06-25 DIAGNOSIS — M25569 Pain in unspecified knee: Secondary | ICD-10-CM | POA: Insufficient documentation

## 2011-06-25 DIAGNOSIS — J45909 Unspecified asthma, uncomplicated: Secondary | ICD-10-CM | POA: Insufficient documentation

## 2011-06-28 LAB — D-DIMER, QUANTITATIVE: D-Dimer, Quant: <0.22

## 2011-06-28 LAB — I-STAT 8, (EC8 V) (CONVERTED LAB)
Acid-Base Excess: 2
Bicarbonate: 29.1 — ABNORMAL HIGH
Chloride: 105
HCT: 43
Potassium: 3.7
pH, Ven: 7.332 — ABNORMAL HIGH

## 2011-06-28 LAB — POCT CARDIAC MARKERS: Troponin i, poc: <0.05

## 2011-06-28 LAB — POCT I-STAT CREATININE
Creatinine, Ser: 1
Operator id: 277751

## 2011-08-01 ENCOUNTER — Emergency Department (HOSPITAL_COMMUNITY)
Admission: EM | Admit: 2011-08-01 | Discharge: 2011-08-02 | Disposition: A | Discharge status: Home / Self Care | Payer: Self-pay | Attending: Emergency Medicine | Admitting: Emergency Medicine

## 2011-08-01 ENCOUNTER — Encounter: Payer: Self-pay | Admitting: Emergency Medicine

## 2011-08-01 DIAGNOSIS — M76891 Other specified enthesopathies of right lower limb, excluding foot: Secondary | ICD-10-CM

## 2011-08-01 DIAGNOSIS — M25569 Pain in unspecified knee: Secondary | ICD-10-CM | POA: Insufficient documentation

## 2011-08-01 NOTE — ED Notes (Signed)
Pt states he has been having right knee swelling and pain for 2 weeks.  Went to [facility] [address] where a doctor drew fluid off his knee.  He was being tested for gout but has not heard back from any of the tests.  States his pain is getting so bad that it is hard for him to walk.

## 2011-08-02 MED ORDER — HYDROCODONE-ACETAMINOPHEN 5-325 MG PO TABS: 1.0000 | ORAL_TABLET | Freq: Four times a day (QID) | ORAL | Status: AC | PRN

## 2011-08-02 MED ORDER — DICLOFENAC SODIUM 75 MG PO TBEC: 75.0000 mg | DELAYED_RELEASE_TABLET | Freq: Two times a day (BID) | ORAL | Status: DC

## 2011-08-02 NOTE — ED Provider Notes (Signed)
Medical screening examination/treatment/procedure(s) were performed by non-physician practitioner and as supervising physician I was immediately available for consultation/collaboration.    Celene Kras, MD 08/02/11 [phone]

## 2011-08-02 NOTE — ED Provider Notes (Signed)
History     CSN: [id number] Arrival date & time: 08/01/2011 10:38 PM   First MD Initiated Contact with Patient 08/02/11 0019     Reports the pain was gradual. 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 Reports if he stands too long his superior knee will begin to swell. States he followed up with [facility] where they drained fluid off of his right knee. States he is still waiting for a return called to determine wheter he had an infected knee or gout. Patient is a 50 y.o. male presenting with knee pain. The history is provided by the patient.  Knee Pain This is a new problem. Episode onset: 3 weeks. The problem occurs constantly. The problem has been gradually worsening. Pertinent negatives include no chills, fever or joint swelling. The symptoms are aggravated by bending, walking and standing. He has tried acetaminophen for the symptoms. The treatment provided no relief.    History reviewed. No pertinent past medical history.  History reviewed. No pertinent past surgical history.  History reviewed. No pertinent family history.  History  Substance Use Topics  . Smoking status: Never Smoker   . Smokeless tobacco: Not on file  . Alcohol Use: No      Review of Systems  Constitutional: Negative for fever and chills.  Musculoskeletal: Negative for back pain and joint swelling.       Positive for knee pain  All other systems reviewed and are negative.    Allergies  Review of patient's allergies indicates no known allergies.  Home Medications  No current outpatient prescriptions on file.  BP 120/78  Pulse 84  Temp(Src) 98.4 F (36.9 C) (Oral)  Resp 18  SpO2 96%  Physical Exam  Constitutional: He is oriented to person, place, and time. He appears well-developed and well-nourished.  HENT:  Head: Normocephalic and atraumatic.  Eyes: Pupils are equal, round, and reactive to light.  Musculoskeletal:       Right knee: He exhibits decreased  range of motion. He exhibits no swelling, no effusion, no deformity, no laceration, no erythema, normal alignment, no LCL laxity and normal patellar mobility. tenderness found.       Legs: Neurological: He is alert and oriented to person, place, and time.  Skin: Skin is warm and dry. No rash noted. No erythema. No pallor.  Psychiatric: He has a normal mood and affect. His behavior is normal.    ED Course  Procedures   MDM          Thomasene Lot, [place] 08/02/11 [phone]

## 2011-08-13 ENCOUNTER — Emergency Department (INDEPENDENT_AMBULATORY_CARE_PROVIDER_SITE_OTHER)
Admission: EM | Admit: 2011-08-13 | Discharge: 2011-08-13 | Disposition: A | Discharge status: Home / Self Care | Payer: Self-pay | Source: Home / Self Care

## 2011-08-13 ENCOUNTER — Encounter (HOSPITAL_COMMUNITY): Payer: Self-pay | Admitting: *Deleted

## 2011-08-13 DIAGNOSIS — M25561 Pain in right knee: Secondary | ICD-10-CM

## 2011-08-13 DIAGNOSIS — M25569 Pain in unspecified knee: Secondary | ICD-10-CM

## 2011-08-13 HISTORY — DX: Pain in unspecified knee: M25.569

## 2011-08-13 NOTE — ED Notes (Signed)
C/O chronic knee problems w/ hx surgery.  C/O right knee pain and swelling x approx 1 month, but cannot afford to go back to ortho surgeon.  Has been applying ice and took some tramadol, "but all they do is make me go to sleep".

## 2011-08-13 NOTE — ED Provider Notes (Signed)
History     CSN: [id number] Arrival date & time: 08/13/2011  4:58 PM   None     Chief Complaint  Patient presents with  . Knee Pain    swelling    (Consider location/radiation/quality/duration/timing/severity/associated sxs/prior treatment) HPI Comments: "My knee is swollen and messed up". Pain and swelling Rt knee x 1 mos. Was seen at [facility] one mos ago. Had an appt last week for follow up but couldn't afford to go. Has had multiple ED visits for same knee pain. Most recent 08-01-11. States he didn't fill the prescriptions from the last ER visit. "I didn't think they would help."    Patient is a 50 y.o. male presenting with knee pain. The history is provided by the patient.  Knee Pain This is a chronic problem. Episode onset: 1 mos ago. The problem occurs constantly. The problem has not changed since onset.Pertinent negatives include no chest pain and no shortness of breath. The symptoms are aggravated by walking. The symptoms are relieved by nothing. He has tried nothing for the symptoms.    Past Medical History  Diagnosis Date  . Knee pain     Past Surgical History  Procedure Date  . Knee arthroscopy     x 2    History reviewed. No pertinent family history.  History  Substance Use Topics  . Smoking status: Former Games developer  . Smokeless tobacco: Not on file  . Alcohol Use: No      Review of Systems  Constitutional: Negative for fever and chills.  Respiratory: Negative for shortness of breath.   Cardiovascular: Negative for chest pain.  Musculoskeletal: Positive for joint swelling.    Allergies  Review of patient's allergies indicates no known allergies.  Home Medications   Current Outpatient Rx  Name Route Sig Dispense Refill  . DICLOFENAC SODIUM 75 MG PO TBEC Oral Take 1 tablet (75 mg total) by mouth 2 (two) times daily. 30 tablet 0  . HYDROCODONE-ACETAMINOPHEN 5-325 MG PO TABS Oral Take 1-2 tablets by mouth every 6 (six) hours as needed for pain. 15 tablet 0      BP 124/65  Pulse 55  Temp(Src) 98.2 F (36.8 C) (Oral)  Resp 16  SpO2 100%  Physical Exam  Nursing note and vitals reviewed. Constitutional: He appears well-developed and well-nourished. No distress.  Cardiovascular: Normal rate, regular rhythm and normal heart sounds.   Pulmonary/Chest: Effort normal and breath sounds normal. No respiratory distress.  Musculoskeletal:       Right knee: He exhibits effusion (mild). He exhibits normal range of motion, no ecchymosis, no deformity, no erythema, no LCL laxity and no MCL laxity. no tenderness found.  Skin: Skin is warm and dry.  Psychiatric: He has a normal mood and affect.    ED Course  Procedures (including critical care time)  Labs Reviewed - No data to display No results found.   No diagnosis found.    MDM   08-01-11 ER visit reviewed.       Melody Comas, [place] 08/13/11 1756

## 2011-08-13 NOTE — ED Provider Notes (Signed)
Medical screening examination/treatment/procedure(s) were performed by non-physician practitioner and as supervising physician I was immediately available for consultation/collaboration.  Corrie Mckusick, MD 08/13/11 2030

## 2011-08-30 ENCOUNTER — Encounter (HOSPITAL_COMMUNITY): Payer: Self-pay | Admitting: Emergency Medicine

## 2011-08-30 ENCOUNTER — Emergency Department (HOSPITAL_COMMUNITY)
Admission: EM | Admit: 2011-08-30 | Discharge: 2011-08-30 | Disposition: A | Discharge status: Home / Self Care | Payer: Self-pay | Attending: Emergency Medicine | Admitting: Emergency Medicine

## 2011-08-30 DIAGNOSIS — J111 Influenza due to unidentified influenza virus with other respiratory manifestations: Secondary | ICD-10-CM | POA: Insufficient documentation

## 2011-08-30 MED ORDER — ALBUTEROL SULFATE HFA 108 (90 BASE) MCG/ACT IN AERS
2.0000 | INHALATION_SPRAY | Freq: Four times a day (QID) | RESPIRATORY_TRACT | Status: DC
  Administered 2011-08-30: 2 via RESPIRATORY_TRACT
  Filled 2011-08-30: qty 6.7

## 2011-08-30 MED ORDER — AZITHROMYCIN 250 MG PO TABS: 250.0000 mg | ORAL_TABLET | Freq: Every day | ORAL | Status: AC

## 2011-08-30 NOTE — ED Notes (Signed)
Pt reports cough and nasal congestion x 4 days; reports taking otc meds for s/s but not getting any relief

## 2011-08-30 NOTE — ED Provider Notes (Signed)
History     CSN: [id number] Arrival date & time: 08/30/2011  2:09 PM   First MD Initiated Contact with Patient 08/30/11 1601      Chief Complaint  Patient presents with  . Cough  . Nasal Congestion    (Consider location/radiation/quality/duration/timing/severity/associated sxs/prior treatment) Patient is a 50 y.o. male presenting with cough. The history is provided by the patient.  Cough This is a new problem. The current episode started more than 2 days ago (4 days). The problem occurs every few minutes. The problem has not changed since onset.The cough is productive of sputum. There has been no fever. Associated symptoms include chest pain, headaches, sore throat and myalgias. Pertinent negatives include no chills, no ear congestion, no ear pain, no rhinorrhea, no shortness of breath, no wheezing and no eye redness. The treatment provided no relief. Smoker: Former smoker. His past medical history does not include bronchitis, pneumonia, COPD, emphysema or asthma.   Patient with four-day history of cough nasal congestion no nausea no vomiting or diarrhea coughs occasionally productive shaver headache and bodyaches sore throat is present but is now resolved he has tried Mucinex D at home without much help.   Past Medical History  Diagnosis Date  . Knee pain     Past Surgical History  Procedure Date  . Knee arthroscopy     x 2    No family history on file.  History  Substance Use Topics  . Smoking status: Former Games developer  . Smokeless tobacco: Not on file  . Alcohol Use: No      Review of Systems  Constitutional: Negative for fever and chills.  HENT: Positive for sore throat. Negative for ear pain, rhinorrhea and neck pain.   Eyes: Negative for redness.  Respiratory: Positive for cough. Negative for shortness of breath and wheezing.   Cardiovascular: Positive for chest pain.  Gastrointestinal: Negative for nausea, vomiting, abdominal pain and diarrhea.  Genitourinary:  Negative for dysuria.  Musculoskeletal: Positive for myalgias.  Neurological: Positive for headaches.  Hematological: Does not bruise/bleed easily.  Psychiatric/Behavioral: Negative for confusion.    Allergies  Review of patient's allergies indicates no known allergies.  Home Medications   Current Outpatient Rx  Name Route Sig Dispense Refill  . DICLOFENAC SODIUM 75 MG PO TBEC Oral Take 1 tablet (75 mg total) by mouth 2 (two) times daily. 30 tablet 0  . IBUPROFEN 200 MG PO TABS Oral Take 200 mg by mouth every 6 (six) hours as needed. pain     . AZITHROMYCIN 250 MG PO TABS Oral Take 1 tablet (250 mg total) by mouth daily. Take first 2 tablets together, then 1 every day until finished. 6 tablet 0    BP 121/86  Pulse 76  Temp(Src) 98.2 F (36.8 C) (Oral)  Resp 16  SpO2 96%  Physical Exam  Nursing note and vitals reviewed. Constitutional: He appears well-developed and well-nourished.    ED Course  Procedures (including critical care time)  Labs Reviewed - No data to display No results found.   1. Influenza       MDM   Patient symptoms consistent with influenza he's been having symptoms now for 4 days sore throat is improved occasionally productive cough persist still with nasal congestion and a burning sensation when he breathes. Trying Mucinex D without much help. We'll have him try Robitussin-DM for the cough given albuterol inhaler to help with the cough and keep his lungs open and a trial of Zithromax but still suspect  this is a viral etiology and advice will be of minimal help. Patient does not need a work note. To return if he gets worse.          Shelda Jakes, MD 08/30/11 [phone]

## 2012-02-19 ENCOUNTER — Emergency Department (HOSPITAL_COMMUNITY)
Admission: EM | Admit: 2012-02-19 | Discharge: 2012-02-19 | Disposition: A | Discharge status: Home / Self Care | Payer: Self-pay | Attending: Emergency Medicine | Admitting: Emergency Medicine

## 2012-02-19 ENCOUNTER — Encounter (HOSPITAL_COMMUNITY): Payer: Self-pay | Admitting: *Deleted

## 2012-02-19 DIAGNOSIS — J329 Chronic sinusitis, unspecified: Secondary | ICD-10-CM

## 2012-02-19 DIAGNOSIS — J320 Chronic maxillary sinusitis: Secondary | ICD-10-CM | POA: Insufficient documentation

## 2012-02-19 DIAGNOSIS — M545 Low back pain, unspecified: Secondary | ICD-10-CM | POA: Insufficient documentation

## 2012-02-19 DIAGNOSIS — Z87891 Personal history of nicotine dependence: Secondary | ICD-10-CM | POA: Insufficient documentation

## 2012-02-19 MED ORDER — AMOXICILLIN-POT CLAVULANATE 875-125 MG PO TABS: 1.0000 | ORAL_TABLET | Freq: Two times a day (BID) | ORAL | Status: AC

## 2012-02-19 MED ORDER — IBUPROFEN 800 MG PO TABS: 800.0000 mg | ORAL_TABLET | Freq: Three times a day (TID) | ORAL | Status: AC | PRN

## 2012-02-19 MED ORDER — GUAIFENESIN ER 1200 MG PO TB12: 1.0000 | ORAL_TABLET | Freq: Two times a day (BID) | ORAL | Status: DC

## 2012-02-19 MED ORDER — KETOROLAC TROMETHAMINE 60 MG/2ML IM SOLN
60.0000 mg | Freq: Once | INTRAMUSCULAR | Status: AC
  Administered 2012-02-19: 60 mg via INTRAMUSCULAR
  Filled 2012-02-19: qty 2

## 2012-02-19 NOTE — ED Notes (Signed)
Pt reports headache and low back pain.  States that he started off with a cold and now has a headache from it.

## 2012-02-19 NOTE — ED Provider Notes (Signed)
History     CSN: [id number]  Arrival date & time 02/19/12  1702   First MD Initiated Contact with Patient 02/19/12 1757      Chief Complaint  Patient presents with  . Headache  . Back Pain    (Consider location/radiation/quality/duration/timing/severity/associated sxs/prior treatment) HPI Patient, states, that he has had sinus pressure and pain for the last 3 weeks.  Patient, states that he has nasal congestion, as well, but no fever.  Patient, also, shortness of breath, cough, sore throat, visual changes, lethargy, dysuria, nausea, vomiting, or diarrhea.  Patient, states that his low has a mild aching.  Patient, states that his muscles feel sore.  Patient denies any treatment other than over-the-counter allergy pill.  Past Medical History  Diagnosis Date  . Knee pain     Past Surgical History  Procedure Date  . Knee arthroscopy     x 2    No family history on file.  History  Substance Use Topics  . Smoking status: Former Games developer  . Smokeless tobacco: Not on file  . Alcohol Use: No      Review of Systems All other systems negative except as documented in the HPI. All pertinent positives and negatives as reviewed in the HPI. \ Allergies  Review of patient's allergies indicates no known allergies.  Home Medications   Current Outpatient Rx  Name Route Sig Dispense Refill  . OVER THE COUNTER MEDICATION Oral Take 1 tablet by mouth daily. Daily allergy pill.      BP 109/73  Pulse 75  Temp(Src) 98.4 F (36.9 C) (Oral)  Resp 20  SpO2 98%  Physical Exam  Nursing note and vitals reviewed. Constitutional: He is oriented to person, place, and time. He appears well-developed and well-nourished. No distress.  HENT:  Right Ear: Tympanic membrane normal.  Left Ear: Tympanic membrane normal.  Nose: Mucosal edema present. No rhinorrhea. Right sinus exhibits maxillary sinus tenderness. Right sinus exhibits no frontal sinus tenderness. Left sinus exhibits no maxillary  sinus tenderness and no frontal sinus tenderness.  Mouth/Throat: Uvula is midline, oropharynx is clear and moist and mucous membranes are normal.  Eyes: Pupils are equal, round, and reactive to light.  Neck: Normal range of motion. Neck supple.  Cardiovascular: Normal rate, regular rhythm and normal heart sounds.   Pulmonary/Chest: Effort normal and breath sounds normal.  Musculoskeletal:       Lumbar back: He exhibits tenderness and pain. He exhibits normal range of motion, no bony tenderness, no swelling, no deformity and no spasm.       Back:  Neurological: He is alert and oriented to person, place, and time.  Skin: Skin is warm and dry. No rash noted.    ED Course  Procedures (including critical care time)   Patient be treated for maxillary sinusitis based on his duration of symptoms and persistent pain.  Patient denies return here for any worsening in his condition.  Told to increase his fluid intake  MDM          Carlyle Dolly, PA-C 02/19/12 1816

## 2012-02-19 NOTE — ED Provider Notes (Signed)
Medical screening examination/treatment/procedure(s) were performed by non-physician practitioner and as supervising physician I was immediately available for consultation/collaboration.   Forbes Cellar, MD 02/19/12 1821

## 2012-02-19 NOTE — Discharge Instructions (Signed)
Return here as needed. Increase your fluid intake as much as possible. 

## 2012-07-12 ENCOUNTER — Emergency Department (HOSPITAL_COMMUNITY)
Admission: EM | Admit: 2012-07-12 | Discharge: 2012-07-12 | Discharge status: Against Medical Advice | Payer: Self-pay | Attending: Emergency Medicine | Admitting: Emergency Medicine

## 2012-07-12 DIAGNOSIS — M25569 Pain in unspecified knee: Secondary | ICD-10-CM | POA: Insufficient documentation

## 2012-07-12 NOTE — ED Notes (Signed)
Pt called x 2 with out response

## 2012-07-12 NOTE — ED Notes (Signed)
Pt called unable to locate

## 2012-08-10 ENCOUNTER — Emergency Department (HOSPITAL_COMMUNITY)
Admission: EM | Admit: 2012-08-10 | Discharge: 2012-08-10 | Disposition: A | Discharge status: Home / Self Care | Payer: Self-pay | Attending: Emergency Medicine | Admitting: Emergency Medicine

## 2012-08-10 ENCOUNTER — Encounter (HOSPITAL_COMMUNITY): Payer: Self-pay | Admitting: Emergency Medicine

## 2012-08-10 DIAGNOSIS — J3489 Other specified disorders of nose and nasal sinuses: Secondary | ICD-10-CM | POA: Insufficient documentation

## 2012-08-10 DIAGNOSIS — J069 Acute upper respiratory infection, unspecified: Secondary | ICD-10-CM | POA: Insufficient documentation

## 2012-08-10 DIAGNOSIS — R05 Cough: Secondary | ICD-10-CM | POA: Insufficient documentation

## 2012-08-10 DIAGNOSIS — J45909 Unspecified asthma, uncomplicated: Secondary | ICD-10-CM | POA: Insufficient documentation

## 2012-08-10 DIAGNOSIS — J329 Chronic sinusitis, unspecified: Secondary | ICD-10-CM | POA: Insufficient documentation

## 2012-08-10 DIAGNOSIS — R059 Cough, unspecified: Secondary | ICD-10-CM | POA: Insufficient documentation

## 2012-08-10 DIAGNOSIS — Z87891 Personal history of nicotine dependence: Secondary | ICD-10-CM | POA: Insufficient documentation

## 2012-08-10 HISTORY — DX: Unspecified asthma, uncomplicated: J45.909

## 2012-08-10 MED ORDER — AZITHROMYCIN 250 MG PO TABS: ORAL_TABLET | ORAL | Status: DC

## 2012-08-10 MED ORDER — BENZONATATE 100 MG PO CAPS: 100.0000 mg | ORAL_CAPSULE | Freq: Three times a day (TID) | ORAL | Status: DC

## 2012-08-10 MED ORDER — GUAIFENESIN 100 MG/5ML PO LIQD: 100.0000 mg | ORAL | Status: DC | PRN

## 2012-08-10 NOTE — ED Provider Notes (Signed)
History     CSN: [id number]  Arrival date & time 08/10/12  0040   First MD Initiated Contact with Patient 08/10/12 0044      Chief Complaint  Patient presents with  . URI    (Consider location/radiation/quality/duration/timing/severity/associated sxs/prior treatment) HPI Comments: Patient presents with complaint of productive cough with clear sputum and rhinorrhea since Tuesday. Patient states that he was required to work out in the rain and has felt sick since. Denies fever or chills. Denies NVD or abdominal pain.  The history is provided by the patient. No language interpreter was used.    Past Medical History  Diagnosis Date  . Knee pain   . Asthma     Past Surgical History  Procedure Date  . Knee arthroscopy     x 2    No family history on file.  History  Substance Use Topics  . Smoking status: Former Games developer  . Smokeless tobacco: Not on file  . Alcohol Use: No      Review of Systems  Constitutional: Negative for fever and chills.  HENT: Positive for congestion and rhinorrhea.   Respiratory: Positive for cough.   Gastrointestinal: Negative for nausea, vomiting, abdominal pain and diarrhea.    Allergies  Review of patient's allergies indicates no known allergies.  Home Medications   Current Outpatient Rx  Name  Route  Sig  Dispense  Refill  . PHENYLEPHRINE-DM-GG-APAP 5-10-200-325 MG PO TABS   Oral   Take 1-2 tablets by mouth every 6 (six) hours as needed. For cold and headache symptoms           BP 112/65  Pulse 90  Temp 97.4 F (36.3 C) (Oral)  Resp 18  SpO2 97%  Physical Exam  Nursing note and vitals reviewed. Constitutional: He appears well-developed and well-nourished.  HENT:  Head: Normocephalic and atraumatic.  Mouth/Throat: Oropharynx is clear and moist. No oropharyngeal exudate.       Tenderness of the maxillary sinuses.  Eyes: Conjunctivae normal are normal. No scleral icterus.  Neck: Normal range of motion. Neck supple.    Cardiovascular: Normal rate, regular rhythm and normal heart sounds.   Pulmonary/Chest: Effort normal and breath sounds normal. He has no wheezes.  Abdominal: Soft. Bowel sounds are normal. There is no tenderness.  Neurological: He is alert.  Skin: Skin is warm and dry.    ED Course  Procedures (including critical care time)  Labs Reviewed - No data to display No results found.   1. URI (upper respiratory infection)   2. Sinusitis       MDM  Patient presented with history and physical consistent with URI/sinusitis. Given medication for supportive care and Z-pak. Return precautions given.         Pixie Casino, PA-C 08/10/12 0149

## 2012-08-10 NOTE — ED Provider Notes (Signed)
Medical screening examination/treatment/procedure(s) were performed by non-physician practitioner and as supervising physician I was immediately available for consultation/collaboration. Devoria Albe, MD, Armando Gang   Ward Givens, MD 08/10/12 [phone]

## 2012-08-10 NOTE — ED Notes (Signed)
C/o productive cough with clear sputum and runny nose since working outside in the rain on Tuesday.

## 2012-08-13 ENCOUNTER — Emergency Department (HOSPITAL_COMMUNITY): Payer: Self-pay

## 2012-08-13 ENCOUNTER — Emergency Department (HOSPITAL_COMMUNITY)
Admission: EM | Admit: 2012-08-13 | Discharge: 2012-08-13 | Disposition: A | Discharge status: Home / Self Care | Payer: Self-pay | Attending: Emergency Medicine | Admitting: Emergency Medicine

## 2012-08-13 ENCOUNTER — Encounter (HOSPITAL_COMMUNITY): Payer: Self-pay | Admitting: *Deleted

## 2012-08-13 DIAGNOSIS — Z87891 Personal history of nicotine dependence: Secondary | ICD-10-CM | POA: Insufficient documentation

## 2012-08-13 DIAGNOSIS — J45909 Unspecified asthma, uncomplicated: Secondary | ICD-10-CM | POA: Insufficient documentation

## 2012-08-13 DIAGNOSIS — Z8739 Personal history of other diseases of the musculoskeletal system and connective tissue: Secondary | ICD-10-CM | POA: Insufficient documentation

## 2012-08-13 DIAGNOSIS — R059 Cough, unspecified: Secondary | ICD-10-CM | POA: Insufficient documentation

## 2012-08-13 DIAGNOSIS — R079 Chest pain, unspecified: Secondary | ICD-10-CM | POA: Insufficient documentation

## 2012-08-13 DIAGNOSIS — R05 Cough: Secondary | ICD-10-CM

## 2012-08-13 MED ORDER — DEXAMETHASONE SODIUM PHOSPHATE 10 MG/ML IJ SOLN
10.0000 mg | Freq: Once | INTRAMUSCULAR | Status: AC
  Administered 2012-08-13: 10 mg via INTRAMUSCULAR
  Filled 2012-08-13: qty 1

## 2012-08-13 NOTE — ED Notes (Signed)
Pt c/o cough/chills since last Tuesday; treated at [facility] Fri night for same and states rx not helping; cant sleep; states has coughed so much his stomach hurts

## 2012-08-13 NOTE — ED Provider Notes (Signed)
History     CSN: [id number]  Arrival date & time 08/13/12  0129   First MD Initiated Contact with Patient 08/13/12 0211      Chief Complaint  Patient presents with  . Cough     HPI Patient presents with cough and chills since last Tuesday.  Was seen at [facility] and treated with antibiotics and Robitussin.  Symptoms have not improved and he still coughing.  Cough is productive it is clear sputum.  Patient says she's felt chilled but no documented fever.  Has past history of asthma.  Patient has not smoked for several years. Past Medical History  Diagnosis Date  . Knee pain   . Asthma     Past Surgical History  Procedure Date  . Knee arthroscopy     x 2    No family history on file.  History  Substance Use Topics  . Smoking status: Former Games developer  . Smokeless tobacco: Not on file  . Alcohol Use: No      Review of Systems All other systems reviewed and are negative Allergies  Review of patient's allergies indicates no known allergies.  Home Medications   Current Outpatient Rx  Name  Route  Sig  Dispense  Refill  . AZITHROMYCIN 250 MG PO TABS      2 po day one, then 1 daily x 4 days   5 tablet   0   . BENZONATATE 100 MG PO CAPS   Oral   Take 1 capsule (100 mg total) by mouth every 8 (eight) hours.   21 capsule   0   . GUAIFENESIN 100 MG/5ML PO LIQD   Oral   Take 5-10 mLs (100-200 mg total) by mouth every 4 (four) hours as needed for cough.   60 mL   0   . PHENYLEPHRINE-DM-GG-APAP 5-10-200-325 MG PO TABS   Oral   Take 1-2 tablets by mouth every 6 (six) hours as needed. For cold and headache symptoms           BP 98/69  Pulse 87  Temp 97.8 F (36.6 C)  Resp 20  SpO2 98%  Physical Exam  Nursing note and vitals reviewed. Constitutional: He appears well-developed and well-nourished.  HENT:  Head: Normocephalic and atraumatic.  Mouth/Throat: Oropharynx is clear and moist. No oropharyngeal exudate.       Tenderness of the maxillary sinuses.   Eyes: Conjunctivae normal are normal. No scleral icterus.  Neck: Normal range of motion. Neck supple.  Cardiovascular: Normal rate, regular rhythm and normal heart sounds.   Pulmonary/Chest: Effort normal and breath sounds normal. No respiratory distress. He has no wheezes.  Abdominal: Soft. Bowel sounds are normal. There is no tenderness.  Neurological: He is alert.  Skin: Skin is warm and dry.    ED Course  Procedures (including critical care time)  Medications  dexamethasone (DECADRON) injection 10 mg (not administered)   Labs Reviewed - No data to display Dg Chest 2 View  08/13/2012  *RADIOLOGY REPORT*  Clinical Data: Cough and chest pain.  CHEST - 2 VIEW  Comparison: Chest radiograph performed 03/28/2009  Findings: The lungs are well-aerated and clear.  There is no evidence of focal opacification, pleural effusion or pneumothorax.  The heart is normal in size; the mediastinal contour is within normal limits.  No acute osseous abnormalities are seen.  IMPRESSION: No acute cardiopulmonary process seen.   Original Report Authenticated By: Tonia Ghent, M.D.      1. Cough  MDM         Nelia Shi, MD 08/13/12 [phone]

## 2012-10-28 ENCOUNTER — Encounter (HOSPITAL_COMMUNITY): Payer: Self-pay | Admitting: *Deleted

## 2012-10-28 ENCOUNTER — Emergency Department (HOSPITAL_COMMUNITY): Payer: Self-pay

## 2012-10-28 ENCOUNTER — Emergency Department (HOSPITAL_COMMUNITY)
Admission: EM | Admit: 2012-10-28 | Discharge: 2012-10-28 | Disposition: A | Discharge status: Home / Self Care | Payer: Self-pay | Attending: Emergency Medicine | Admitting: Emergency Medicine

## 2012-10-28 DIAGNOSIS — R509 Fever, unspecified: Secondary | ICD-10-CM | POA: Insufficient documentation

## 2012-10-28 DIAGNOSIS — Z87891 Personal history of nicotine dependence: Secondary | ICD-10-CM | POA: Insufficient documentation

## 2012-10-28 DIAGNOSIS — R059 Cough, unspecified: Secondary | ICD-10-CM | POA: Insufficient documentation

## 2012-10-28 DIAGNOSIS — J45909 Unspecified asthma, uncomplicated: Secondary | ICD-10-CM | POA: Insufficient documentation

## 2012-10-28 DIAGNOSIS — R51 Headache: Secondary | ICD-10-CM | POA: Insufficient documentation

## 2012-10-28 DIAGNOSIS — Z8739 Personal history of other diseases of the musculoskeletal system and connective tissue: Secondary | ICD-10-CM | POA: Insufficient documentation

## 2012-10-28 DIAGNOSIS — J3489 Other specified disorders of nose and nasal sinuses: Secondary | ICD-10-CM | POA: Insufficient documentation

## 2012-10-28 DIAGNOSIS — J159 Unspecified bacterial pneumonia: Secondary | ICD-10-CM | POA: Insufficient documentation

## 2012-10-28 DIAGNOSIS — J189 Pneumonia, unspecified organism: Secondary | ICD-10-CM

## 2012-10-28 DIAGNOSIS — R05 Cough: Secondary | ICD-10-CM | POA: Insufficient documentation

## 2012-10-28 DIAGNOSIS — R1310 Dysphagia, unspecified: Secondary | ICD-10-CM | POA: Insufficient documentation

## 2012-10-28 MED ORDER — AZITHROMYCIN 250 MG PO TABS
500.0000 mg | ORAL_TABLET | Freq: Once | ORAL | Status: AC
  Administered 2012-10-28: 500 mg via ORAL
  Filled 2012-10-28: qty 2

## 2012-10-28 MED ORDER — BENZONATATE 100 MG PO CAPS: 100.0000 mg | ORAL_CAPSULE | Freq: Three times a day (TID) | ORAL | Status: DC

## 2012-10-28 MED ORDER — ACETAMINOPHEN 325 MG PO TABS
650.0000 mg | ORAL_TABLET | Freq: Once | ORAL | Status: AC
  Administered 2012-10-28: 650 mg via ORAL
  Filled 2012-10-28: qty 2

## 2012-10-28 MED ORDER — AZITHROMYCIN 250 MG PO TABS: 250.0000 mg | ORAL_TABLET | Freq: Every day | ORAL | Status: DC

## 2012-10-28 NOTE — ED Notes (Signed)
Patient is alert and orientedx4.  Patient was explained discharge instructions and they understood them with no questions.   

## 2012-10-28 NOTE — ED Provider Notes (Signed)
History     CSN: [id number]  Arrival date & time 10/28/12  2031   First MD Initiated Contact with Patient 10/28/12 2045      Chief Complaint  Patient presents with  . Nasal Congestion  . Sore Throat    (Consider location/radiation/quality/duration/timing/severity/associated sxs/prior treatment) Patient is a 52 y.o. male presenting with pharyngitis.  Sore Throat   Pt reports 3 days of URI symptoms, cough occasionally productive of clear sputum, sore throat, worse with swallowing and nasal congestion. Subjective fever at home with headache. No vomiting, no diarrhea, no SOB.   Past Medical History  Diagnosis Date  . Knee pain   . Asthma     Past Surgical History  Procedure Laterality Date  . Knee arthroscopy      x 2    History reviewed. No pertinent family history.  History  Substance Use Topics  . Smoking status: Former Games developer  . Smokeless tobacco: Not on file  . Alcohol Use: No      Review of Systems All other systems reviewed and are negative except as noted in HPI.   Allergies  Review of patient's allergies indicates no known allergies.  Home Medications  No current outpatient prescriptions on file.  BP 122/83  Pulse 108  Temp(Src) 100.1 F (37.8 C) (Oral)  Resp 18  SpO2 100%  Physical Exam  Nursing note and vitals reviewed. Constitutional: He is oriented to person, place, and time. He appears well-developed and well-nourished.  HENT:  Head: Normocephalic and atraumatic.  Mouth/Throat: No oropharyngeal exudate.  Pharynx erythematous  Eyes: EOM are normal. Pupils are equal, round, and reactive to light.  Neck: Normal range of motion. Neck supple.  Cardiovascular: Normal rate, normal heart sounds and intact distal pulses.   Pulmonary/Chest: Effort normal and breath sounds normal.  Abdominal: Bowel sounds are normal. He exhibits no distension. There is no tenderness.  Musculoskeletal: Normal range of motion. He exhibits no edema and no  tenderness.  Lymphadenopathy:    He has cervical adenopathy.  Neurological: He is alert and oriented to person, place, and time. He has normal strength. No cranial nerve deficit or sensory deficit.  Skin: Skin is warm and dry. No rash noted.  Psychiatric: He has a normal mood and affect.    ED Course  Procedures (including critical care time)  Labs Reviewed  RAPID STREP SCREEN   Dg Chest 2 View  10/28/2012  *RADIOLOGY REPORT*  Clinical Data: Fever, cough  CHEST - 2 VIEW  Comparison: Prior chest x-ray 08/13/2012  Findings: Query a very subtle patchy opacity in the right middle lobe.  Otherwise, the lungs are clear.  Cardiac and mediastinal contours are within normal limits.  Unchanged pulmonary hyperexpansion and central bronchitic changes consistent with underlying COPD.  No acute osseous abnormality.  IMPRESSION:  1.  Query mild patchy opacity in the right middle lobe.  This could represent early infiltrate/pneumonia. 2.  Otherwise, stable background changes of COPD.   Original Report Authenticated By: Malachy Moan, M.D.      1. CAP (community acquired pneumonia)       MDM  CXR as above. Will give Z-pak. Advised to return for worsening.         Charles B. Bernette Mayers, MD 10/28/12 [phone]

## 2012-10-28 NOTE — ED Notes (Addendum)
Pt states sore throat and congestion for since yesterday. Pt states that he has been having a runny nose and he feels like his voice is about to go. Pt tried mucinex OTC and feels worse after. Pt states that he feels like he was been running a fever. Pt states last time this happened he was given a shot (of antibiotic) and felt better. Pt has slight cough as well.

## 2012-10-28 NOTE — ED Notes (Signed)
Patient says he has been having cough and sore throat since Saturday.  Patient says he was out in the weather last Saturday when it snowed and he thinks he developed a cold.  He is having sore throat, coughing and chest pain from the cough. Patient says he took mucinex to help get rid of the congestion.

## 2012-10-31 ENCOUNTER — Emergency Department (HOSPITAL_COMMUNITY): Payer: Self-pay

## 2012-10-31 ENCOUNTER — Emergency Department (HOSPITAL_COMMUNITY)
Admission: EM | Admit: 2012-10-31 | Discharge: 2012-10-31 | Disposition: A | Discharge status: Home / Self Care | Payer: Self-pay | Attending: Emergency Medicine | Admitting: Emergency Medicine

## 2012-10-31 ENCOUNTER — Encounter (HOSPITAL_COMMUNITY): Payer: Self-pay | Admitting: Cardiology

## 2012-10-31 DIAGNOSIS — J40 Bronchitis, not specified as acute or chronic: Secondary | ICD-10-CM | POA: Insufficient documentation

## 2012-10-31 DIAGNOSIS — Z8739 Personal history of other diseases of the musculoskeletal system and connective tissue: Secondary | ICD-10-CM | POA: Insufficient documentation

## 2012-10-31 DIAGNOSIS — J45909 Unspecified asthma, uncomplicated: Secondary | ICD-10-CM

## 2012-10-31 DIAGNOSIS — Z87891 Personal history of nicotine dependence: Secondary | ICD-10-CM | POA: Insufficient documentation

## 2012-10-31 DIAGNOSIS — J45901 Unspecified asthma with (acute) exacerbation: Secondary | ICD-10-CM | POA: Insufficient documentation

## 2012-10-31 MED ORDER — HYDROCODONE-HOMATROPINE 5-1.5 MG/5ML PO SYRP: 2.5000 mL | ORAL_SOLUTION | Freq: Four times a day (QID) | ORAL | Status: DC | PRN

## 2012-10-31 MED ORDER — DEXAMETHASONE SODIUM PHOSPHATE 10 MG/ML IJ SOLN
10.0000 mg | Freq: Once | INTRAMUSCULAR | Status: AC
  Administered 2012-10-31: 10 mg via INTRAMUSCULAR
  Filled 2012-10-31: qty 1

## 2012-10-31 MED ORDER — ALBUTEROL SULFATE HFA 108 (90 BASE) MCG/ACT IN AERS
2.0000 | INHALATION_SPRAY | Freq: Once | RESPIRATORY_TRACT | Status: AC
  Administered 2012-10-31: 2 via RESPIRATORY_TRACT
  Filled 2012-10-31: qty 6.7

## 2012-10-31 NOTE — ED Notes (Signed)
Pt reports he was seen here about 2 days ago and told he may have some PNA. Given PO antibiotics but feels like he is not getting any better.

## 2012-10-31 NOTE — ED Provider Notes (Signed)
History  This chart was scribed for non-physician practitioner working with Doug Sou, MD by Ardeen Jourdain, ED Scribe. This patient was seen in room TR08C/TR08C and the patient's care was started at 2011.  CSN: [id number]  Arrival date & time 10/31/12  1844   First MD Initiated Contact with Patient 10/31/12 2011      Chief Complaint  Patient presents with  . Cough  . Nasal Congestion  . Chills     Patient is a 52 y.o. male presenting with cough. The history is provided by the patient. No language interpreter was used.  Cough Cough characteristics:  Unable to specify Severity:  Moderate Onset quality:  Gradual Timing:  Constant Progression:  Worsening Chronicity:  New Smoker: no   Context: not sick contacts   Relieved by:  None tried Worsened by:  Deep breathing Ineffective treatments:  None tried Associated symptoms: no chest pain, no chills, no diaphoresis, no fever, no headaches and no rhinorrhea    REMER COUSE is a 52 y.o. male with a h/o asthma who presents to the Emergency Department complaining of gradually worsening cough that began a few days ago with associated chest tightness. He states he was seen here 2 days ago and was told he may have PNA. He states he was given antibiotics but states he is not feeling any relief. He denies any fever, chills, SOB and  CP as associated symptoms. Freqeunt coughing at night. Difficulty sleeping.  Past Medical History  Diagnosis Date  . Knee pain   . Asthma     Past Surgical History  Procedure Laterality Date  . Knee arthroscopy      x 2    History reviewed. No pertinent family history.  History  Substance Use Topics  . Smoking status: Former Games developer  . Smokeless tobacco: Not on file  . Alcohol Use: No      Review of Systems  Constitutional: Negative for fever, chills and diaphoresis.  HENT: Negative for rhinorrhea.   Respiratory: Positive for cough.   Cardiovascular: Negative for chest pain.   Neurological: Negative for headaches.  All other systems reviewed and are negative.    Allergies  Review of patient's allergies indicates no known allergies.  Home Medications   Current Outpatient Rx  Name  Route  Sig  Dispense  Refill  . azithromycin (ZITHROMAX) 250 MG tablet   Oral   Take 250 mg by mouth daily. Started medication on 10-29-12           Triage Vitals: BP 115/80  Pulse 88  Temp(Src) 98.2 F (36.8 C) (Oral)  Resp 18  SpO2 96%  Physical Exam  Nursing note and vitals reviewed. Constitutional: He is oriented to person, place, and time. He appears well-developed and well-nourished. No distress.  HENT:  Head: Normocephalic and atraumatic.  Eyes: EOM are normal. Pupils are equal, round, and reactive to light.  Neck: Normal range of motion. Neck supple. No tracheal deviation present.  Cardiovascular: Normal rate, regular rhythm and normal heart sounds.  Exam reveals no gallop and no friction rub.   No murmur heard. Pulmonary/Chest: Effort normal. No respiratory distress. He has wheezes. He has no rales. He exhibits no tenderness.  Mild expiratory wheezes  Abdominal: Soft. He exhibits no distension.  Musculoskeletal: Normal range of motion. He exhibits no edema.  Neurological: He is alert and oriented to person, place, and time.  Skin: Skin is warm and dry.  Psychiatric: He has a normal mood and affect. His behavior  is normal.    ED Course  Procedures (including critical care time)  DIAGNOSTIC STUDIES: Oxygen Saturation is 96% on room air, adequate by my interpretation.    COORDINATION OF CARE:  9:21 PM: Discussed treatment plan which includes an albuterol inhaler, cough medicine and a CXR with pt at bedside and pt agreed to plan.     Labs Reviewed - No data to display Dg Chest 2 View  10/31/2012  *RADIOLOGY REPORT*  Clinical Data: Cough.  Nasal congestion and chills.  CHEST - 2 VIEW  Comparison: 10/28/2012.  Findings: Normal heart size.  No pleural  effusion or edema. Bronchitic changes are noted.  No airspace consolidation.  The visualized osseous structures appear unremarkable.  IMPRESSION:  1.  Bronchitic changes. 2.  No pneumonia.   Original Report Authenticated By: Signa Kell, M.D.      1. Bronchitis   2. Reactive airway disease       MDM   Patients symptoms are consistent with URI, likely viral etiology. Discussed that antibiotics are not indicated for viral infections. Pt will be discharged with symptomatic treatment. Will d/c with treatment for reactive airway.  Verbalizes understanding and is agreeable with plan. Pt is hemodynamically stable & in NAD prior to dc.         Arthor Captain, PA-C 10/31/12 2341

## 2012-11-01 NOTE — ED Provider Notes (Signed)
Medical screening examination/treatment/procedure(s) were performed by non-physician practitioner and as supervising physician I was immediately available for consultation/collaboration.  Dannisha Eckmann, MD 11/01/12 0003 

## 2013-03-05 ENCOUNTER — Encounter (HOSPITAL_COMMUNITY): Payer: Self-pay | Admitting: Emergency Medicine

## 2013-03-05 ENCOUNTER — Emergency Department (HOSPITAL_COMMUNITY)
Admission: EM | Admit: 2013-03-05 | Discharge: 2013-03-05 | Disposition: A | Discharge status: Home / Self Care | Payer: Self-pay | Attending: Emergency Medicine | Admitting: Emergency Medicine

## 2013-03-05 DIAGNOSIS — R300 Dysuria: Secondary | ICD-10-CM | POA: Insufficient documentation

## 2013-03-05 DIAGNOSIS — N342 Other urethritis: Secondary | ICD-10-CM | POA: Insufficient documentation

## 2013-03-05 DIAGNOSIS — Z8739 Personal history of other diseases of the musculoskeletal system and connective tissue: Secondary | ICD-10-CM | POA: Insufficient documentation

## 2013-03-05 DIAGNOSIS — N390 Urinary tract infection, site not specified: Secondary | ICD-10-CM | POA: Insufficient documentation

## 2013-03-05 DIAGNOSIS — J45909 Unspecified asthma, uncomplicated: Secondary | ICD-10-CM | POA: Insufficient documentation

## 2013-03-05 DIAGNOSIS — Z87891 Personal history of nicotine dependence: Secondary | ICD-10-CM | POA: Insufficient documentation

## 2013-03-05 LAB — URINALYSIS W MICROSCOPIC + REFLEX CULTURE
Bilirubin Urine: NEGATIVE
Hgb urine dipstick: NEGATIVE
Ketones, ur: NEGATIVE mg/dL
Nitrite: NEGATIVE
Specific Gravity, Urine: 1.03 (ref 1.005–1.030)
Urobilinogen, UA: 1 mg/dL (ref 0.0–1.0)
pH: 5 (ref 5.0–8.0)

## 2013-03-05 MED ORDER — AZITHROMYCIN 250 MG PO TABS
1000.0000 mg | ORAL_TABLET | Freq: Once | ORAL | Status: AC
  Administered 2013-03-05: 1000 mg via ORAL
  Filled 2013-03-05: qty 4

## 2013-03-05 MED ORDER — CEPHALEXIN 500 MG PO CAPS: 500.0000 mg | ORAL_CAPSULE | Freq: Two times a day (BID) | ORAL | Status: DC

## 2013-03-05 MED ORDER — CEFTRIAXONE SODIUM 250 MG IJ SOLR
250.0000 mg | INTRAMUSCULAR | Status: DC
  Administered 2013-03-05: 250 mg via INTRAMUSCULAR
  Filled 2013-03-05: qty 250

## 2013-03-05 NOTE — ED Provider Notes (Signed)
History    This chart was scribed for non-physician practitioner, Lucretia Kern, working with Doug Sou, MD by Donne Anon, ED Scribe. This patient was seen in room TR09C/TR09C and the patient's care was started at 1507.  CSN: [id number] Arrival date & time 03/05/13  1457  First MD Initiated Contact with Patient 03/05/13 1507     Chief Complaint  Patient presents with  . Penile Discharge    The history is provided by the patient. No language interpreter was used.   HPI Comments: Zachary [name] is a 52 y.o. male who presents to the Emergency Department complaining of 1 day of gradual onset, gradually worsening, intermittent white penile discharge that began after he had unprotected sex. He reports associated dysuria. He denies abdominal pain, fever, genital sores, genital lesions, or any other pain.  Past Medical History  Diagnosis Date  . Knee pain   . Asthma    Past Surgical History  Procedure Laterality Date  . Knee arthroscopy      x 2   History reviewed. No pertinent family history. History  Substance Use Topics  . Smoking status: Former Games developer  . Smokeless tobacco: Not on file  . Alcohol Use: No    Review of Systems  Constitutional: Negative for fever.  Gastrointestinal: Negative for abdominal pain.  Genitourinary: Positive for dysuria and discharge. Negative for genital sores.  All other systems reviewed and are negative.    Allergies  Review of patient's allergies indicates no known allergies.  Home Medications   Current Outpatient Rx  Name  Route  Sig  Dispense  Refill  . ibuprofen (ADVIL,MOTRIN) 200 MG tablet   Oral   Take 400 mg by mouth every 6 (six) hours as needed (for headaches).         Marland Kitchen OVER THE COUNTER MEDICATION   Oral   Take 5 mLs by mouth 2 (two) times daily as needed (for cough/congestion *liquid mucinex).          BP 111/77  Pulse 95  Temp(Src) 97.8 F (36.6 C) (Oral)  Resp 18  SpO2 96%  Physical Exam   Nursing note and vitals reviewed. Constitutional: He appears well-developed and well-nourished. No distress.  HENT:  Head: Normocephalic and atraumatic.  Eyes: Conjunctivae are normal.  Neck: Neck supple. No tracheal deviation present.  Cardiovascular: Normal rate, regular rhythm and normal heart sounds.   Pulmonary/Chest: Effort normal and breath sounds normal. No respiratory distress. He has no wheezes. He has no rales.  Abdominal: Soft. Bowel sounds are normal. He exhibits no distension. There is no tenderness. There is no rebound and no guarding.  Genitourinary: Discharge found.  Penis normal. White discharge present.  Musculoskeletal: Normal range of motion.  Neurological: He is alert.  Skin: Skin is warm and dry.  Psychiatric: He has a normal mood and affect. His behavior is normal.    ED Course  Procedures (including critical care time) DIAGNOSTIC STUDIES: Oxygen Saturation is 96% on RA, adequate by my interpretation.    COORDINATION OF CARE: 3:18 PM Discussed treatment plan which includes STI testing and urinalysis with pt at bedside and pt agreed to plan.   4:21 PM Rechecked pt. Informed of results. Will give shot of antibiotic in ED and discharge home with antibiotics.  Results for orders placed during the hospital encounter of 03/05/13  URINALYSIS W MICROSCOPIC + REFLEX CULTURE      Result Value Range   Color, Urine YELLOW  YELLOW   APPearance  CLOUDY (*) CLEAR   Specific Gravity, Urine 1.030  1.005 - 1.030   pH 5.0  5.0 - 8.0   Glucose, UA NEGATIVE  NEGATIVE mg/dL   Hgb urine dipstick NEGATIVE  NEGATIVE   Bilirubin Urine NEGATIVE  NEGATIVE   Ketones, ur NEGATIVE  NEGATIVE mg/dL   Protein, ur NEGATIVE  NEGATIVE mg/dL   Urobilinogen, UA 1.0  0.0 - 1.0 mg/dL   Nitrite NEGATIVE  NEGATIVE   Leukocytes, UA LARGE (*) NEGATIVE   WBC, UA 21-50  <3 WBC/hpf   Bacteria, UA FEW (*) RARE   Squamous Epithelial / LPF RARE  RARE   Urine-Other MUCOUS PRESENT     1.  Urethritis   2. UTI (lower urinary tract infection)     MDM  Pt with penile discharge. Treated in ED for STI, rocephin 250 IM, zithromax 1g PO. Home with keflex for possible UTI although could be infection due to his STI. Advised to follow up with pcp or health dept. Safe sex discussed. Pt has no abdominal pain. No testicular pain. No n/v/d, no fever  Filed Vitals:   03/05/13 1506 03/05/13 1657  BP: 111/77 127/83  Pulse: 95 74  Temp: 97.8 F (36.6 C)   TempSrc: Oral   Resp: 18   SpO2: 96% 98%    I personally performed the services described in this documentation, which was scribed in my presence. The recorded information has been reviewed and is accurate.    Myriam Jacobson Taline Nass, PA-C 03/06/13 0001

## 2013-03-05 NOTE — ED Notes (Signed)
Pt c/o penile discharge and pain with urination; pt sts unprotected sex

## 2013-03-05 NOTE — ED Notes (Signed)
Tatyana EDPA at bedside.

## 2013-03-06 LAB — GC/CHLAMYDIA PROBE AMP
CT Probe RNA: NEGATIVE
GC Probe RNA: POSITIVE — AB

## 2013-03-06 LAB — URINE CULTURE

## 2013-03-06 NOTE — ED Provider Notes (Signed)
Medical screening examination/treatment/procedure(s) were performed by non-physician practitioner and as supervising physician I was immediately available for consultation/collaboration.  Doug Sou, MD 03/06/13 [phone]

## 2013-03-07 NOTE — ED Notes (Addendum)
+   Chlamydia Patient treated with Rocephin And Zithromax-[facility] faxed 

## 2013-03-09 ENCOUNTER — Telehealth (HOSPITAL_COMMUNITY): Payer: Self-pay | Admitting: Emergency Medicine

## 2013-03-09 NOTE — ED Notes (Signed)
Patient notified of + Gonorrhea after ID Verified x three. Treated in ED. STD instructions provided - patient verbalized understanding.

## 2013-10-10 ENCOUNTER — Encounter (HOSPITAL_COMMUNITY): Payer: Self-pay | Admitting: Emergency Medicine

## 2013-10-10 ENCOUNTER — Emergency Department (HOSPITAL_COMMUNITY): Payer: Self-pay

## 2013-10-10 ENCOUNTER — Emergency Department (HOSPITAL_COMMUNITY)
Admission: EM | Admit: 2013-10-10 | Discharge: 2013-10-10 | Disposition: A | Discharge status: Home / Self Care | Payer: Self-pay | Attending: Emergency Medicine | Admitting: Emergency Medicine

## 2013-10-10 DIAGNOSIS — K4090 Unilateral inguinal hernia, without obstruction or gangrene, not specified as recurrent: Secondary | ICD-10-CM | POA: Insufficient documentation

## 2013-10-10 DIAGNOSIS — Z9889 Other specified postprocedural states: Secondary | ICD-10-CM | POA: Insufficient documentation

## 2013-10-10 DIAGNOSIS — M25569 Pain in unspecified knee: Secondary | ICD-10-CM | POA: Insufficient documentation

## 2013-10-10 DIAGNOSIS — Z87891 Personal history of nicotine dependence: Secondary | ICD-10-CM | POA: Insufficient documentation

## 2013-10-10 DIAGNOSIS — M25561 Pain in right knee: Secondary | ICD-10-CM

## 2013-10-10 DIAGNOSIS — J45909 Unspecified asthma, uncomplicated: Secondary | ICD-10-CM | POA: Insufficient documentation

## 2013-10-10 MED ORDER — HYDROCODONE-ACETAMINOPHEN 5-325 MG PO TABS: 1.0000 | ORAL_TABLET | ORAL | Status: DC | PRN

## 2013-10-10 NOTE — ED Provider Notes (Signed)
CSN: [id number]     Arrival date & time 10/10/13  1848 History   First MD Initiated Contact with Patient 10/10/13 1915     Chief Complaint  Patient presents with  . Knee Pain   (Consider location/radiation/quality/duration/timing/severity/associated sxs/prior Treatment) HPI Comments: Patient with history of right knee arthroscopy presents to the ED with complaint of 3 days of right knee pain and swelling - he states that the last time this happened he had to get a cortisone shot.  He reports pain to lateral knee, denies redness, numbness or tingling.  He states pain is worse with flexion and ambulation.  He denies fever, chills, nausea, vomiting, diarrhea.  He also is here with right groin "bulge" for the past week - he states that while coughing he felt this - denies pain, urethral discharge, testicular pain, dysuria.  Patient is a 53 y.o. male presenting with knee pain. The history is provided by the patient. No language interpreter was used.  Knee Pain Location:  Knee Time since incident:  3 days Injury: no   Knee location:  R knee Pain details:    Quality:  Sharp   Radiates to:  Does not radiate   Severity:  Moderate   Onset quality:  Gradual   Duration:  3 days   Timing:  Constant   Progression:  Worsening Chronicity:  Recurrent Dislocation: no   Foreign body present:  No foreign bodies Tetanus status:  Up to date Prior injury to area:  Yes Relieved by:  Nothing Worsened by:  Nothing tried Ineffective treatments:  None tried Associated symptoms: decreased ROM and swelling   Associated symptoms: no back pain, no fatigue, no fever, no itching, no muscle weakness, no neck pain, no numbness, no stiffness and no tingling     Past Medical History  Diagnosis Date  . Knee pain   . Asthma    Past Surgical History  Procedure Laterality Date  . Knee arthroscopy      x 2   No family history on file. History  Substance Use Topics  . Smoking status: Former Research scientist (life sciences)  . Smokeless  tobacco: Not on file  . Alcohol Use: No    Review of Systems  Constitutional: Negative for fever and fatigue.  Musculoskeletal: Negative for back pain, neck pain and stiffness.  Skin: Negative for itching.  All other systems reviewed and are negative.    Allergies  Review of patient's allergies indicates no known allergies.  Home Medications   Current Outpatient Rx  Name  Route  Sig  Dispense  Refill  . ibuprofen (ADVIL,MOTRIN) 200 MG tablet   Oral   Take 400 mg by mouth every 6 (six) hours as needed (for headaches).         . Pseudoeph-Doxylamine-DM-APAP (NYQUIL PO)   Oral   Take 10 mLs by mouth daily as needed (for cough).          BP 113/67  Pulse 70  Temp(Src) 97.5 F (36.4 C) (Oral)  Resp 18  Ht 5\' 6"  (1.676 m)  Wt 143 lb 1 oz (64.893 kg)  BMI 23.10 kg/m2  SpO2 97% Physical Exam  Nursing note and vitals reviewed. Constitutional: He is oriented to person, place, and time. He appears well-developed and well-nourished. No distress.  HENT:  Head: Normocephalic and atraumatic.  Right Ear: External ear normal.  Left Ear: External ear normal.  Nose: Nose normal.  Mouth/Throat: Oropharynx is clear and moist. No oropharyngeal exudate.  Eyes: Conjunctivae are normal. Pupils  are equal, round, and reactive to light. No scleral icterus.  Neck: Normal range of motion. Neck supple.  Cardiovascular: Normal rate, regular rhythm and normal heart sounds.  Exam reveals no gallop and no friction rub.   No murmur heard. Pulmonary/Chest: Effort normal. No respiratory distress. He has no wheezes. He has no rales. He exhibits no tenderness.  Abdominal: Soft. Bowel sounds are normal. He exhibits no distension. There is no tenderness. A hernia is present. Hernia confirmed positive in the right inguinal area. Hernia confirmed negative in the left inguinal area.  Genitourinary: Penis normal. Right testis shows no mass, no swelling and no tenderness. Left testis shows no mass, no  swelling and no tenderness. Circumcised. No penile tenderness. No discharge found.  Musculoskeletal:       Right knee: He exhibits bony tenderness. He exhibits normal range of motion, no swelling, no effusion, no deformity, normal alignment, no LCL laxity and no MCL laxity. Tenderness found. Medial joint line tenderness noted. No patellar tendon tenderness noted.  Lymphadenopathy:    He has no cervical adenopathy.       Right: No inguinal adenopathy present.       Left: No inguinal adenopathy present.  Neurological: He is alert and oriented to person, place, and time. He exhibits normal muscle tone. Coordination normal.  Skin: Skin is warm and dry. No rash noted. No erythema. No pallor.  Psychiatric: He has a normal mood and affect. His behavior is normal. Judgment and thought content normal.    ED Course  Procedures (including critical care time) Labs Review Labs Reviewed - No data to display Imaging Review No results found.  EKG Interpretation   None      Results for orders placed during the hospital encounter of 03/05/13  GC/CHLAMYDIA PROBE AMP      Result Value Range   CT Probe RNA NEGATIVE  NEGATIVE   GC Probe RNA POSITIVE (*) NEGATIVE  URINE CULTURE      Result Value Range   Specimen Description URINE, CLEAN CATCH     Special Requests NONE     Culture  Setup Time 03/05/2013 17:12     Colony Count NO GROWTH     Culture NO GROWTH     Report Status 03/06/2013 FINAL    URINALYSIS W MICROSCOPIC + REFLEX CULTURE      Result Value Range   Color, Urine YELLOW  YELLOW   APPearance CLOUDY (*) CLEAR   Specific Gravity, Urine 1.030  1.005 - 1.030   pH 5.0  5.0 - 8.0   Glucose, UA NEGATIVE  NEGATIVE mg/dL   Hgb urine dipstick NEGATIVE  NEGATIVE   Bilirubin Urine NEGATIVE  NEGATIVE   Ketones, ur NEGATIVE  NEGATIVE mg/dL   Protein, ur NEGATIVE  NEGATIVE mg/dL   Urobilinogen, UA 1.0  0.0 - 1.0 mg/dL   Nitrite NEGATIVE  NEGATIVE   Leukocytes, UA LARGE (*) NEGATIVE   WBC, UA  21-50  <3 WBC/hpf   Bacteria, UA FEW (*) RARE   Squamous Epithelial / LPF RARE  RARE   Urine-Other MUCOUS PRESENT     Dg Knee Complete 4 Views Right  10/10/2013   CLINICAL DATA:  Pain and swelling with no injury  EXAM: RIGHT KNEE - COMPLETE 4+ VIEW  COMPARISON:  06/13/2011  FINDINGS: Mild narrowing of the medial and lateral compartments with minimal medial osteophyte formation. No fracture or dislocation. Small joint effusion.  IMPRESSION: No acute findings. Minimal arthritic change with small joint effusion.  Electronically Signed   By: Skipper Cliche M.D.   On: 10/10/2013 20:57      MDM  Right knee pain Right inguinal hernia  Patient here with progressively worsening right knee pain - mild effusion noted on x-ray and arthritis - will place patient on pain medication and then he will follow up with [facility] where he has been seen before.  He will be referred to a PCP for evaluation and management of his right inguinal hernia, which is easily reducible.   Idalia Needle Joelyn Oms, [place] 10/10/13 2126

## 2013-10-10 NOTE — ED Notes (Signed)
Pt c/o right knee pain x1 week. Pt sts has had bilateral knee arthroscopy 1987. Pt was seen in ED for same " a while back" and sts he received a cortisone shot. Pt ambulatory, steady gait. No obvious swelling, redness noted. Non tender sts pain is worse when walking.

## 2013-10-10 NOTE — Discharge Instructions (Signed)
Hernia A hernia occurs when an internal organ pushes out through a weak spot in the abdominal wall. Hernias most commonly occur in the groin and around the navel. Hernias often can be pushed back into place (reduced). Most hernias tend to get worse over time. Some abdominal hernias can get stuck in the opening (irreducible or incarcerated hernia) and cannot be reduced. An irreducible abdominal hernia which is tightly squeezed into the opening is at risk for impaired blood supply (strangulated hernia). A strangulated hernia is a medical emergency. Because of the risk for an irreducible or strangulated hernia, surgery may be recommended to repair a hernia. CAUSES   Heavy lifting.  Prolonged coughing.  Straining to have a bowel movement.  A cut (incision) made during an abdominal surgery. HOME CARE INSTRUCTIONS   Bed rest is not required. You may continue your normal activities.  Avoid lifting more than 10 pounds (4.5 kg) or straining.  Cough gently. If you are a smoker it is best to stop. Even the best hernia repair can break down with the continual strain of coughing. Even if you do not have your hernia repaired, a cough will continue to aggravate the problem.  Do not wear anything tight over your hernia. Do not try to keep it in with an outside bandage or truss. These can damage abdominal contents if they are trapped within the hernia sac.  Eat a normal diet.  Avoid constipation. Straining over long periods of time will increase hernia size and encourage breakdown of repairs. If you cannot do this with diet alone, stool softeners may be used. SEEK IMMEDIATE MEDICAL CARE IF:   You have a fever.  You develop increasing abdominal pain.  You feel nauseous or vomit.  Your hernia is stuck outside the abdomen, looks discolored, feels hard, or is tender.  You have any changes in your bowel habits or in the hernia that are unusual for you.  You have increased pain or swelling around the  hernia.  You cannot push the hernia back in place by applying gentle pressure while lying down. MAKE SURE YOU:   Understand these instructions.  Will watch your condition.  Will get help right away if you are not doing well or get worse. Document Released: 08/28/2005 Document Revised: 11/20/2011 Document Reviewed: 04/16/2008 [facility] Patient Information 2014 [place].  Inguinal Hernia, Adult Muscles help keep everything in the body in its proper place. But if a weak spot in the muscles develops, something can poke through. That is called a hernia. When this happens in the lower part of the belly (abdomen), it is called an inguinal hernia. (It takes its name from a part of the body in this region called the inguinal canal.) A weak spot in the wall of muscles lets some fat or part of the small intestine bulge through. An inguinal hernia can develop at any age. Men get them more often than women. CAUSES  In adults, an inguinal hernia develops over time.  It can be triggered by:  Suddenly straining the muscles of the lower abdomen.  Lifting heavy objects.  Straining to have a bowel movement. Difficult bowel movements (constipation) can lead to this.  Constant coughing. This may be caused by smoking or lung disease.  Being overweight.  Being pregnant.  Working at a job that requires long periods of standing or heavy lifting.  Having had an inguinal hernia before. One type can be an emergency situation. It is called a strangulated inguinal hernia. It develops  if part of the small intestine slips through the weak spot and cannot get back into the abdomen. The blood supply can be cut off. If that happens, part of the intestine may die. This situation requires emergency surgery. SYMPTOMS  Often, a small inguinal hernia has no symptoms. It is found when a healthcare provider does a physical exam. Larger hernias usually have symptoms.   In adults, symptoms may include:  A lump in  the groin. This is easier to see when the person is standing. It might disappear when lying down.  In men, a lump in the scrotum.  Pain or burning in the groin. This occurs especially when lifting, straining or coughing.  A dull ache or feeling of pressure in the groin.  Signs of a strangulated hernia can include:  A bulge in the groin that becomes very painful and tender to the touch.  A bulge that turns red or purple.  Fever, nausea and vomiting.  Inability to have a bowel movement or to pass gas. DIAGNOSIS  To decide if you have an inguinal hernia, a healthcare provider will probably do a physical examination.  This will include asking questions about any symptoms you have noticed.  The healthcare provider might feel the groin area and ask you to cough. If an inguinal hernia is felt, the healthcare provider may try to slide it back into the abdomen.  Usually no other tests are needed. TREATMENT  Treatments can vary. The size of the hernia makes a difference. Options include:  Watchful waiting. This is often suggested if the hernia is small and you have had no symptoms.  No medical procedure will be done unless symptoms develop.  You will need to watch closely for symptoms. If any occur, contact your healthcare provider right away.  Surgery. This is used if the hernia is larger or you have symptoms.  Open surgery. This is usually an outpatient procedure (you will not stay overnight in a hospital). An cut (incision) is made through the skin in the groin. The hernia is put back inside the abdomen. The weak area in the muscles is then repaired by herniorrhaphy or hernioplasty. Herniorrhaphy: in this type of surgery, the weak muscles are sewn back together. Hernioplasty: a patch or mesh is used to close the weak area in the abdominal wall.  Laparoscopy. In this procedure, a surgeon makes small incisions. A thin tube with a tiny video camera (called a laparoscope) is put into the  abdomen. The surgeon repairs the hernia with mesh by looking with the video camera and using two long instruments. HOME CARE INSTRUCTIONS   After surgery to repair an inguinal hernia:  You will need to take pain medicine prescribed by your healthcare provider. Follow all directions carefully.  You will need to take care of the wound from the incision.  Your activity will be restricted for awhile. This will probably include no heavy lifting for several weeks. You also should not do anything too active for a few weeks. When you can return to work will depend on the type of job that you have.  During "watchful waiting" periods, you should:  Maintain a healthy weight.  Eat a diet high in fiber (fruits, vegetables and whole grains).  Drink plenty of fluids to avoid constipation. This means drinking enough water and other liquids to keep your urine clear or pale yellow.  Do not lift heavy objects.  Do not stand for long periods of time.  Quit smoking. This should  keep you from developing a frequent cough. SEEK MEDICAL CARE IF:   A bulge develops in your groin area.  You feel pain, a burning sensation or pressure in the groin. This might be worse if you are lifting or straining.  You develop a fever of more than 100.5 F (38.1 C). SEEK IMMEDIATE MEDICAL CARE IF:   Pain in the groin increases suddenly.  A bulge in the groin gets bigger suddenly and does not go down.  For men, there is sudden pain in the scrotum. Or, the size of the scrotum increases.  A bulge in the groin area becomes red or purple and is painful to touch.  You have nausea or vomiting that does not go away.  You feel your heart beating much faster than normal.  You cannot have a bowel movement or pass gas.  You develop a fever of more than 102.0 F (38.9 C). Document Released: 01/14/2009 Document Revised: 11/20/2011 Document Reviewed: 01/14/2009 [facility] Patient Information 2014 [place], [place].  Knee  Pain Knee pain can be a result of an injury or other medical conditions. Treatment will depend on the cause of your pain. HOME CARE  Only take medicine as told by your doctor.  Keep a healthy weight. Being overweight can make the knee hurt more.  Stretch before exercising or playing sports.  If there is constant knee pain, change the way you exercise. Ask your doctor for advice.  Make sure shoes fit well. Choose the right shoe for the sport or activity.  Protect your knees. Wear kneepads if needed.  Rest when you are tired. GET HELP RIGHT AWAY IF:   Your knee pain does not stop.  Your knee pain does not get better.  Your knee joint feels hot to the touch.  You have a fever. MAKE SURE YOU:   Understand these instructions.  Will watch this condition.  Will get help right away if you are not doing well or get worse. Document Released: 11/24/2008 Document Revised: 11/20/2011 Document Reviewed: 11/24/2008 [facility] Patient Information 2014 [place], [place].

## 2013-10-12 NOTE — ED Provider Notes (Signed)
Medical screening examination/treatment/procedure(s) were performed by non-physician practitioner and as supervising physician I was immediately available for consultation/collaboration.  EKG Interpretation   None        Jasper Riling. Alvino Chapel, MD 10/12/13 [phone]

## 2014-01-16 ENCOUNTER — Emergency Department (HOSPITAL_COMMUNITY)
Admission: EM | Admit: 2014-01-16 | Discharge: 2014-01-17 | Disposition: A | Discharge status: Home / Self Care | Payer: Self-pay | Attending: Emergency Medicine | Admitting: Emergency Medicine

## 2014-01-16 ENCOUNTER — Encounter (HOSPITAL_COMMUNITY): Payer: Self-pay | Admitting: Emergency Medicine

## 2014-01-16 DIAGNOSIS — J45909 Unspecified asthma, uncomplicated: Secondary | ICD-10-CM | POA: Insufficient documentation

## 2014-01-16 DIAGNOSIS — R52 Pain, unspecified: Secondary | ICD-10-CM | POA: Insufficient documentation

## 2014-01-16 DIAGNOSIS — M25569 Pain in unspecified knee: Secondary | ICD-10-CM | POA: Insufficient documentation

## 2014-01-16 DIAGNOSIS — Z9889 Other specified postprocedural states: Secondary | ICD-10-CM | POA: Insufficient documentation

## 2014-01-16 DIAGNOSIS — G8929 Other chronic pain: Secondary | ICD-10-CM | POA: Insufficient documentation

## 2014-01-16 DIAGNOSIS — L0591 Pilonidal cyst without abscess: Secondary | ICD-10-CM | POA: Insufficient documentation

## 2014-01-16 DIAGNOSIS — M25561 Pain in right knee: Secondary | ICD-10-CM

## 2014-01-16 DIAGNOSIS — Z87891 Personal history of nicotine dependence: Secondary | ICD-10-CM | POA: Insufficient documentation

## 2014-01-16 MED ORDER — MELOXICAM 7.5 MG PO TABS: 7.5000 mg | ORAL_TABLET | Freq: Every day | ORAL | Status: DC

## 2014-01-16 NOTE — ED Notes (Signed)
Pt arrived to the ED with a complaint of right knee.  Pt states he has had orthoscopic knee surgery but is unable to afford "the shots."  Pt states pain has been persistent for three months.  Pt states he also feels he has a cyst on his tailbone which is causing him back pain.

## 2014-01-16 NOTE — ED Provider Notes (Signed)
CSN: [id number]     Arrival date & time 01/16/14  2142 History   None    This chart was scribed for non-physician practitioner, Barton Dubois PA-C, working with No att. providers found by Forrestine Him, ED Scribe. This patient was seen in room WTR3/WLPT3 and the patient's care was started at 11:02 PM.   Chief Complaint  Patient presents with  . Knee Pain  . Back Pain   HPI  HPI Comments: Zachary [name] is a 53 y.o. male who presents to the Emergency Department complaining of ongoing, constant, moderate R knee pain x 3 months that is unchanged. States his knee gives away frequently and says this is progressively getting worse. He states this pain is exacerbated with weight bearing. He denies any new injury or trauma to the knee. Pt states he had orthoscopic knee surgery performed by Dr. Doy Mince in the 80's but is unable to afford the steroid shots at this time. Pt was seen here in January 2015 for same and was given pain medication with relief. He is also taking OTC Ibuprofen with mild temporary improvement at home. He denies any weakness, tingling, or numbness.  Pt also reports a cyst to the gluteal fold onset 2-3 days that he states is causing him some back pain. He denies any drainage to the cyst. He has not tried anything OTC or any home remedies for this area. No fever or chills. No known allergies to medications. He has no other concerns this visit.  Past Medical History  Diagnosis Date  . Knee pain   . Asthma    Past Surgical History  Procedure Laterality Date  . Knee arthroscopy      x 2   History reviewed. No pertinent family history. History  Substance Use Topics  . Smoking status: Former Research scientist (life sciences)  . Smokeless tobacco: Not on file  . Alcohol Use: No    Review of Systems  Constitutional: Negative for fever and chills.  HENT: Negative for congestion.   Eyes: Negative for redness.  Respiratory: Negative for cough.   Gastrointestinal: Negative for nausea and vomiting.   Musculoskeletal: Positive for arthralgias (R knee) and back pain.  Skin: Positive for wound (Gluteal fold). Negative for rash.  Neurological: Negative for weakness and numbness.  Psychiatric/Behavioral: Negative for confusion.      Allergies  Review of patient's allergies indicates no known allergies.  Home Medications   Prior to Admission medications   Medication Sig Start Date End Date Taking? Authorizing Provider  ibuprofen (ADVIL,MOTRIN) 200 MG tablet Take 400 mg by mouth every 6 (six) hours as needed (for headaches).    Historical Provider, MD   Triage Vitals: BP 111/77  Pulse 75  Temp(Src) 98.1 F (36.7 C) (Oral)  Resp 18  SpO2 98%   Physical Exam  Nursing note and vitals reviewed. Constitutional: He is oriented to person, place, and time. He appears well-developed and well-nourished. No distress.  HENT:  Head: Normocephalic and atraumatic.  Eyes:  Normal appearance  Neck: Normal range of motion.  Pulmonary/Chest: Effort normal.  Musculoskeletal: Normal range of motion.  R knee w/out obvious edema or erythema.  Mild diffuse anterior tenderness, worst medial to patella.  Mild pain w/ passive flexion/extension.  2+ DP pulse and distal sensation intact.    Neurological: He is alert and oriented to person, place, and time.  Skin:  Pilonidal cyst top of R medial buttock.  Scabbed and indurated.  No drainage.  Ttp.    Psychiatric: He has  a normal mood and affect. His behavior is normal.    ED Course  Procedures (including critical care time)  DIAGNOSTIC STUDIES: Oxygen Saturation is 98% on RA, Normal by my interpretation.    COORDINATION OF CARE: 11:07 PM- Will perform an I&D procedure. Discussed treatment plan with pt at bedside and pt agreed to plan.     INCISION AND DRAINAGE Performed by: Remer Macho Consent: Verbal consent obtained. Risks and benefits: risks, benefits and alternatives were discussed Type: abscess  Body area:  sacrum  Anesthesia: local infiltration  Incision was made with a scalpel.  Local anesthetic: lidocaine 2% w/ epinephrine  Anesthetic total: 4 ml  Complexity: complex Blunt dissection to break up loculations  Drainage amount: none  Patient tolerance: Patient tolerated the procedure well with no immediate complications.     Labs Review Labs Reviewed - No data to display  Imaging Review No results found.   EKG Interpretation None      MDM   Final diagnoses:  Pilonidal cyst  Chronic pain of right knee    53yo M presents w/ multiple complaints.  Has acute on chronic, non-traumatic pain in R knee.  No signs of septic arthritis on exam.  Will treat symptomatically w/ daily mobic, ice, elevation and rest and have patient f/u with orthopedics.  He declines a knee sleeve.  Also presents w/ pilonidal cyst.  No drainage on I&D.  I meant to prescribe abx for this reason but forgot.  Flow manager to call in bactrim DS and keflex and notify patient.  Referred to [facility].  Return precautions discussed.   I personally performed the services described in this documentation, which was scribed in my presence. The recorded information has been reviewed and is accurate.    Remer Macho, PA-C 01/17/14 [phone]

## 2014-01-16 NOTE — Discharge Instructions (Signed)
Take mobic once a day for knee pain/swelling.  You can take tylenol while your on this medication but avoid ibuprofen, advil, aleve.  Ice 3 times a day for 15-20 minutes.  Elevate when possible to decrease swelling and pain.  Activity as tolerated.  Follow up with orthopedist.  Apply warm compresses to cyst on back and follow up with general surgery for permanent management.  Call [facility] [phone]) if you do not have a primary care doctor and would like assistance with finding one.   You may return to the ER if your pain worsens or you have any other concerns.

## 2014-01-17 ENCOUNTER — Telehealth (HOSPITAL_BASED_OUTPATIENT_CLINIC_OR_DEPARTMENT_OTHER): Payer: Self-pay | Admitting: *Deleted

## 2014-01-17 ENCOUNTER — Telehealth (HOSPITAL_COMMUNITY): Payer: Self-pay | Admitting: *Deleted

## 2014-01-17 NOTE — ED Notes (Signed)
Contacted by Gertha Calkin, PA to call in prescriptions for patient Bactrim DS 800/160mg  i tab BID x 7 days #14 Keflex 500mg  ii tabs BID x 7 days #28 Zachary [name]  [phone]

## 2014-01-17 NOTE — Telephone Encounter (Signed)
Pt called back and was given information re: Prescriptions available at [facility] [phone])

## 2014-01-18 NOTE — ED Provider Notes (Signed)
Medical screening examination/treatment/procedure(s) were performed by non-physician practitioner and as supervising physician I was immediately available for consultation/collaboration.   EKG Interpretation None        Rubye Strohmeyer B. Riana Tessmer, MD 01/18/14 1658 

## 2014-03-09 ENCOUNTER — Encounter (HOSPITAL_COMMUNITY): Payer: Self-pay | Admitting: Emergency Medicine

## 2014-03-09 ENCOUNTER — Emergency Department (HOSPITAL_COMMUNITY): Payer: Self-pay

## 2014-03-09 ENCOUNTER — Emergency Department (HOSPITAL_COMMUNITY)
Admission: EM | Admit: 2014-03-09 | Discharge: 2014-03-09 | Disposition: A | Discharge status: Home / Self Care | Payer: Self-pay | Attending: Emergency Medicine | Admitting: Emergency Medicine

## 2014-03-09 DIAGNOSIS — J45909 Unspecified asthma, uncomplicated: Secondary | ICD-10-CM | POA: Insufficient documentation

## 2014-03-09 DIAGNOSIS — L237 Allergic contact dermatitis due to plants, except food: Secondary | ICD-10-CM

## 2014-03-09 DIAGNOSIS — M1710 Unilateral primary osteoarthritis, unspecified knee: Secondary | ICD-10-CM | POA: Insufficient documentation

## 2014-03-09 DIAGNOSIS — Z87891 Personal history of nicotine dependence: Secondary | ICD-10-CM | POA: Insufficient documentation

## 2014-03-09 DIAGNOSIS — G8929 Other chronic pain: Secondary | ICD-10-CM | POA: Insufficient documentation

## 2014-03-09 DIAGNOSIS — M7121 Synovial cyst of popliteal space [Baker], right knee: Secondary | ICD-10-CM

## 2014-03-09 DIAGNOSIS — IMO0002 Reserved for concepts with insufficient information to code with codable children: Principal | ICD-10-CM

## 2014-03-09 DIAGNOSIS — M1711 Unilateral primary osteoarthritis, right knee: Secondary | ICD-10-CM

## 2014-03-09 DIAGNOSIS — M7989 Other specified soft tissue disorders: Secondary | ICD-10-CM

## 2014-03-09 DIAGNOSIS — L255 Unspecified contact dermatitis due to plants, except food: Secondary | ICD-10-CM | POA: Insufficient documentation

## 2014-03-09 DIAGNOSIS — M7120 Synovial cyst of popliteal space [Baker], unspecified knee: Secondary | ICD-10-CM | POA: Insufficient documentation

## 2014-03-09 DIAGNOSIS — M79609 Pain in unspecified limb: Secondary | ICD-10-CM

## 2014-03-09 MED ORDER — PREDNISONE 20 MG PO TABS
60.0000 mg | ORAL_TABLET | Freq: Once | ORAL | Status: AC
  Administered 2014-03-09: 60 mg via ORAL
  Filled 2014-03-09: qty 3

## 2014-03-09 MED ORDER — PREDNISONE 20 MG PO TABS: ORAL_TABLET | ORAL | Status: DC

## 2014-03-09 NOTE — ED Notes (Signed)
Doppler study in progress 

## 2014-03-09 NOTE — ED Notes (Signed)
Pt was exposed to poison ivy last week, states as been putting lotion on it its getting better but it still irritating. Pt also c/o right knee pain and swelling x a couple of months

## 2014-03-09 NOTE — Progress Notes (Signed)
[facility] provided pt with a list of primary care resources and a [facility] [employer] Card application to help patient establish primary care.  °

## 2014-03-09 NOTE — ED Provider Notes (Signed)
CSN: [id number]     Arrival date & time 03/09/14  1214 History  This chart was scribed for non-physician practitioner Jamse Mead, PA-C, working with Neta Ehlers, MD, by Neta Ehlers, ED Scribe. This patient was seen in room [name] and the patient's care was started at 3:51 PM.   First MD Initiated Contact with Patient 03/09/14 1510     Chief Complaint  Patient presents with  . Joint Swelling    right knee  . Knee Pain    right  . Poison Ivy    The history is provided by the patient. No language interpreter was used.   HPI Comments: Zachary [name] is a 53 y.o. male, with chronic knee pain, who presents to the Emergency Department complaining of chronic swelling to his right knee which has occurred intermittently for five months. Mr. Korver states he has also experienced calf pain and redness in addition to the knee pain. He characterizes the pain as "sharp" and he reports he does not sleep well due to the pain. He also states the affected site feels hot. Furthermore, he states intermittent numbness to his knee and lower extremity. He denies loss of sensation to the foot or an inability to ambulate. He denies a fever. Additionally, he denies recent falls, injury, or extended travels. The pt reports a h/o knee arthroscopy multiple years ago.   Mr. [name] also complains of poison ivy exposure to his upper extremities; he used calamine lotion with mild improvement. He reports no exposure to his lower extremities. However, he also endorses redness and itching to his right leg.   The pt works in [employer]. He reports he wore a knee brace for several months without improvement.  Past Medical History  Diagnosis Date  . Knee pain   . Asthma    Past Surgical History  Procedure Laterality Date  . Knee arthroscopy      x 2   No family history on file. History  Substance Use Topics  . Smoking status: Former Research scientist (life sciences)  . Smokeless tobacco: Not on file  . Alcohol Use: No     Review of Systems  Constitutional: Negative for fever.  Musculoskeletal: Positive for arthralgias and joint swelling. Negative for gait problem.  Skin: Positive for color change and rash.  Neurological: Positive for numbness. Negative for weakness.  All other systems reviewed and are negative.   Allergies  Review of patient's allergies indicates no known allergies.  Home Medications   Prior to Admission medications   Medication Sig Start Date End Date Taking? Authorizing Provider  acetaminophen (TYLENOL) 500 MG tablet Take 1,000 mg by mouth every 6 (six) hours as needed for moderate pain.   Yes Historical Provider, MD  predniSONE (DELTASONE) 20 MG tablet 3 tabs po daily x 3 days, then 2 tabs x 3 days, then 1.5 tabs x 3 days, then 1 tab x 3 days, then 0.5 tabs x 3 days 03/09/14   Jamse Mead, PA-C   Triage Vitals: BP 119/79  Pulse 98  Temp(Src) 98.5 F (36.9 C) (Oral)  Resp 18  SpO2 97%  Physical Exam  Nursing note and vitals reviewed. Constitutional: He is oriented to person, place, and time. He appears well-developed and well-nourished. No distress.  HENT:  Head: Normocephalic and atraumatic.  Mouth/Throat: Oropharynx is clear and moist. No oropharyngeal exudate.  Eyes: Conjunctivae and EOM are normal. Pupils are equal, round, and reactive to light. Right eye exhibits no discharge. Left eye exhibits no discharge.  Neck:  Normal range of motion. Neck supple. No tracheal deviation present.  Cardiovascular: Normal rate, regular rhythm and normal heart sounds.  Exam reveals no friction rub.   No murmur heard. Pulses:      Radial pulses are 2+ on the right side, and 2+ on the left side.       Dorsalis pedis pulses are 2+ on the right side, and 2+ on the left side.  Cap refill < 3 seconds Negative swelling or pitting edema noted to the lower extremities bilaterally   Pulmonary/Chest: Effort normal and breath sounds normal. No respiratory distress. He has no wheezes. He has  no rales.  Musculoskeletal: Normal range of motion. He exhibits tenderness. He exhibits no edema.       Right knee: He exhibits normal range of motion, no swelling, no effusion, no ecchymosis, no deformity, no laceration, no erythema, normal alignment and no LCL laxity. Tenderness found. Medial joint line, lateral joint line, MCL, LCL and patellar tendon tenderness noted.       Legs: Negative swelling, erythema, inflammation, lesions, sores, malalignment, deformities, warmth upon palpation noted to the right knee. Negative warmth upon palpation. Negative anterior and posterior draw sign. Negative valgus and varus tension. Full flexion and extension noted to the right knee. Full ROM to the right ankle without difficulty - full inversion, eversion, dorsiflexion, plantar flexion noted without difficulty or ataxia noted.   Lymphadenopathy:    He has no cervical adenopathy.  Neurological: He is alert and oriented to person, place, and time. No cranial nerve deficit. He exhibits normal muscle tone. Coordination normal.  Skin: Skin is warm and dry. Rash noted.  Miniscule papules with erythematous base noted to the upper extremities bilaterally, circumferentially, and anterior aspect of the right tib-fib region. Negative blanching. Negative active drainage or bleeding noted.   Psychiatric: He has a normal mood and affect. His behavior is normal. Thought content normal.    ED Course  Procedures (including critical care time)  DIAGNOSTIC STUDIES: Oxygen Saturation is 97% on room air, normal by my interpretation.    COORDINATION OF CARE:  4:05 PM- Discussed treatment plan with patient, and the patient agreed to the plan. The plan includes imaging and a doppler.  4:06 PM- Consulted with Dr. Tawnya Crook re the pt's care.   Labs Review Labs Reviewed - No data to display  Imaging Review Dg Knee Complete 4 Views Right  03/09/2014   CLINICAL DATA:  Atraumatic pain and swelling for 5 months; remote history  of knee surgery  EXAM: RIGHT KNEE - COMPLETE 4+ VIEW  COMPARISON:  Right knee series of October 10, 2013  FINDINGS: The bones are osteopenic. There is beaking of the tibial spines. There is no acute or healing fracture. The medial joint compartment exhibits very mild narrowing. The soft tissues are unremarkable.  IMPRESSION: There are very mild degenerative changes of the right knee.   Electronically Signed   By: David  Martinique   On: 03/09/2014 15:51     EKG Interpretation None      MDM   Final diagnoses:  Poison ivy dermatitis  Arthritis of right knee  Baker's cyst, right    Medications  predniSONE (DELTASONE) tablet 60 mg (not administered)   Filed Vitals:   03/09/14 1236  BP: 119/79  Pulse: 98  Temp: 98.5 F (36.9 C)  TempSrc: Oral  Resp: 18  SpO2: 97%   This provider reviewed patient's chart. Patient is been seen and assessed a couple times in ED setting regarding  right knee pain. Patient has not followed up with orthopedics. Plain film of right knee noted very mild degenerative changes of the right knee. Doppler of right lower knee negative for DVT or SVT-small Baker cyst identified behind the right popliteal fossa. Doubt septic joint. Doubt gout. Doubt compartment syndrome. Doubt ischemia. Suspicion to be arthritis of the right knee. Patient neurovascularly intact. Pulses palpable and strong. Negative focal neurological deficits noted. Full range of motion to the right knee identified. Stable right knee noted. Patient presenting to poison ivy-prednisone administered in ED setting. Patient stable, afebrile. Patient not septic appearing. Discharged patient. Patient placed in knee sleeve for comfort. [facility], dermatologist, orthopedics. Discussed with patient to rest, ice, elevate. Discussed with patient to closely monitor symptoms and if symptoms are to worsen or change to report back to the ED - strict return instructions given.  Patient  agreed to plan of care, understood, all questions answered.   Jamse Mead, PA-C 03/09/14 2120

## 2014-03-09 NOTE — Discharge Instructions (Signed)
Please call and set-up an appointment with [facility] and [facility] to be seen and re-assessed Please call and set-up an appointment with Dermatology to be seen regarding rash Please call and set-up an appointment with Orthopedics regarding ongoing knee pain  Please take medications as prescribed Please keep knee sleeve on especially when working Please avoid any physical or strenuous activity Please continue to rest and stay hydrated Please continue to monitor symptoms closely and if symptoms are to worsen or change (fever greater than 101, chills, sweating, nausea, vomiting, diarrhea, chest pain, shortness of breath, difficulty breathing, numbness, tingling, fall, injury, weakness, swelling to the leg, hot to the touch, red streaks) please report back to the ED immediately   Arthritis, Nonspecific Arthritis is inflammation of a joint. This usually means pain, redness, warmth or swelling are present. One or more joints may be involved. There are a number of types of arthritis. Your caregiver may not be able to tell what type of arthritis you have right away. CAUSES  The most common cause of arthritis is the wear and tear on the joint (osteoarthritis). This causes damage to the cartilage, which can break down over time. The knees, hips, back and neck are most often affected by this type of arthritis. Other types of arthritis and common causes of joint pain include:  Sprains and other injuries near the joint. Sometimes minor sprains and injuries cause pain and swelling that develop hours later.  Rheumatoid arthritis. This affects hands, feet and knees. It usually affects both sides of your body at the same time. It is often associated with chronic ailments, fever, weight loss and general weakness.  Crystal arthritis. Gout and pseudo gout can cause occasional acute severe pain, redness and swelling in the foot, ankle, or knee.  Infectious arthritis. Bacteria can get into a joint through a break  in overlying skin. This can cause infection of the joint. Bacteria and viruses can also spread through the blood and affect your joints.  Drug, infectious and allergy reactions. Sometimes joints can become mildly painful and slightly swollen with these types of illnesses. SYMPTOMS   Pain is the main symptom.  Your joint or joints can also be red, swollen and warm or hot to the touch.  You may have a fever with certain types of arthritis, or even feel overall ill.  The joint with arthritis will hurt with movement. Stiffness is present with some types of arthritis. DIAGNOSIS  Your caregiver will suspect arthritis based on your description of your symptoms and on your exam. Testing may be needed to find the type of arthritis:  Blood and sometimes urine tests.  X-ray tests and sometimes CT or MRI scans.  Removal of fluid from the joint (arthrocentesis) is done to check for bacteria, crystals or other causes. Your caregiver (or a specialist) will numb the area over the joint with a local anesthetic, and use a needle to remove joint fluid for examination. This procedure is only minimally uncomfortable.  Even with these tests, your caregiver may not be able to tell what kind of arthritis you have. Consultation with a specialist (rheumatologist) may be helpful. TREATMENT  Your caregiver will discuss with you treatment specific to your type of arthritis. If the specific type cannot be determined, then the following general recommendations may apply. Treatment of severe joint pain includes:  Rest.  Elevation.  Anti-inflammatory medication (for example, ibuprofen) may be prescribed. Avoiding activities that cause increased pain.  Only take over-the-counter or prescription medicines for  pain and discomfort as recommended by your caregiver.  Cold packs over an inflamed joint may be used for 10 to 15 minutes every hour. Hot packs sometimes feel better, but do not use overnight. Do not use hot  packs if you are diabetic without your caregiver's permission.  A cortisone shot into arthritic joints may help reduce pain and swelling.  Any acute arthritis that gets worse over the next 1 to 2 days needs to be looked at to be sure there is no joint infection. Long-term arthritis treatment involves modifying activities and lifestyle to reduce joint stress jarring. This can include weight loss. Also, exercise is needed to nourish the joint cartilage and remove waste. This helps keep the muscles around the joint strong. HOME CARE INSTRUCTIONS   Do not take aspirin to relieve pain if gout is suspected. This elevates uric acid levels.  Only take over-the-counter or prescription medicines for pain, discomfort or fever as directed by your caregiver.  Rest the joint as much as possible.  If your joint is swollen, keep it elevated.  Use crutches if the painful joint is in your leg.  Drinking plenty of fluids may help for certain types of arthritis.  Follow your caregiver's dietary instructions.  Try low-impact exercise such as:  Swimming.  Water aerobics.  Biking.  Walking.  Morning stiffness is often relieved by a warm shower.  Put your joints through regular range-of-motion. SEEK MEDICAL CARE IF:   You do not feel better in 24 hours or are getting worse.  You have side effects to medications, or are not getting better with treatment. SEEK IMMEDIATE MEDICAL CARE IF:   You have a fever.  You develop severe joint pain, swelling or redness.  Many joints are involved and become painful and swollen.  There is severe back pain and/or leg weakness.  You have loss of bowel or bladder control. Document Released: 10/05/2004 Document Revised: 11/20/2011 Document Reviewed: 10/21/2008 [facility] Patient Information 2015 [place], [place]. This information is not intended to replace advice given to you by your health care provider. Make sure you discuss any questions you have with your  health care provider.   Poison [employer] ivy is a inflammation of the skin (contact dermatitis) caused by touching the allergens on the leaves of the ivy plant following previous exposure to the plant. The rash usually appears 48 hours after exposure. The rash is usually bumps (papules) or blisters (vesicles) in a linear pattern. Depending on your own sensitivity, the rash may simply cause redness and itching, or it may also progress to blisters which may break open. These must be well cared for to prevent secondary bacterial (germ) infection, followed by scarring. Keep any open areas dry, clean, dressed, and covered with an antibacterial ointment if needed. The eyes may also get puffy. The puffiness is worst in the morning and gets better as the day progresses. This dermatitis usually heals without scarring, within 2 to 3 weeks without treatment. HOME CARE INSTRUCTIONS  Thoroughly wash with soap and water as soon as you have been exposed to poison ivy. You have about one half hour to remove the plant resin before it will cause the rash. This washing will destroy the oil or antigen on the skin that is causing, or will cause, the rash. Be sure to wash under your fingernails as any plant resin there will continue to spread the rash. Do not rub skin vigorously when washing affected area. Poison ivy cannot spread if no oil from  the plant remains on your body. A rash that has progressed to weeping sores will not spread the rash unless you have not washed thoroughly. It is also important to wash any clothes you have been wearing as these may carry active allergens. The rash will return if you wear the unwashed clothing, even several days later. Avoidance of the plant in the future is the best measure. Poison ivy plant can be recognized by the number of leaves. Generally, poison ivy has three leaves with flowering branches on a single stem. Diphenhydramine may be purchased over the counter and used as needed for  itching. Do not drive with this medication if it makes you drowsy.Ask your caregiver about medication for children. SEEK MEDICAL CARE IF:  Open sores develop.  Redness spreads beyond area of rash.  You notice purulent (pus-like) discharge.  You have increased pain.  Other signs of infection develop (such as fever). Document Released: 08/25/2000 Document Revised: 11/20/2011 Document Reviewed: 07/14/2009 [facility] Patient Information 2015 [place], [place]. This information is not intended to replace advice given to you by your health care provider. Make sure you discuss any questions you have with your health care provider.   Emergency Department Resource Guide 1) Find a Doctor and Pay Out of Pocket Although you won't have to find out who is covered by your insurance plan, it is a good idea to ask around and get recommendations. You will then need to call the office and see if the doctor you have chosen will accept you as a new patient and what types of options they offer for patients who are self-pay. Some doctors offer discounts or will set up payment plans for their patients who do not have insurance, but you will need to ask so you aren't surprised when you get to your appointment.  2) Contact Your Local Health Department Not all health departments have doctors that can see patients for sick visits, but many do, so it is worth a call to see if yours does. If you don't know where your local health department is, you can check in your phone book. The CDC also has a tool to help you locate your state's health department, and many state websites also have listings of all of their local health departments.  3) Find a [facility] If your illness is not likely to be very severe or complicated, you may want to try a walk in clinic. These are popping up all over the country in pharmacies, drugstores, and shopping centers. They're usually staffed by nurse practitioners or physician assistants that  have been trained to treat common illnesses and complaints. They're usually fairly quick and inexpensive. However, if you have serious medical issues or chronic medical problems, these are probably not your best option.  No Primary Care Doctor: - Call Health Connect at  [phone] - they can help you locate a primary care doctor that  accepts your insurance, provides certain services, etc. - Physician Referral Service- [phone]  Chronic Pain Problems: Organization         Address  Phone   Notes  [facility]  [phone] Patients need to be referred by their primary care doctor.   Medication Assistance: Organization         Address  Phone   Notes  [facility] Medication [facility] [address] [place], [place] [address] [phone] --Must be a resident of [facility] -- Must have NO insurance coverage whatsoever (no  Medicaid/ Medicare, etc.) -- The pt. MUST have a primary care doctor that directs their care regularly and follows them in the community   MedAssist  [phone]   [employer]  [phone]    Agencies that provide inexpensive medical care: Organization         Address  Phone   Notes  [place]  [phone]   [facility]    [phone]   [facility] [place], [place] [address] [phone]   [place] [address] [place] [phone]   Planned Parenthood    [phone]   [facility]    [phone]   [place] and [place] [address], [place] Phone:  [phone], Fax:  [phone] Hours of Operation:  9 am - 6 pm, M-F.  Also accepts Medicaid/Medicare and self-pay.  [facility] for [place] [address], [address], [place] Phone: [phone], Fax: [phone]. Hours of Operation:  8:30 am - 5:30 pm, M-F.  Also accepts Medicaid and  self-pay.  [facility] [place] [address], [place] Phone: [phone]   [place], [place], [place] [phone], Ext. [phone] Mondays & Thursdays: 7-9 AM.  First 15 patients are seen on a first come, first serve basis.    [place] Providers:  Organization         Address  Phone   Notes  [facility] [address],  [phone] Also accepts self-pay patients.  [facility] [address], [place]  [phone]   [place], [address], [place] [phone]   [facility] [address], [place] [phone]   Lucianne Lei [address], [place]   [phone] Only accepts [place] Access [place] patients after they have their name applied to their card.   Self-Pay (no insurance) in [facility]:  Organization         Address  Phone   Notes  Sickle Cell Patients, [facility] [place] [phone]   [name] [place] [phone]   [facility] [place]  [place], [place], [place] [phone]   [facility]/Dr. Osei-Bonsu  [address], [place] or [address], [place] [phone] Phone number for both [place] and [place] locations is the same.  [facility] [address], [place] [phone]   [facility] [address], [place] or [address] [phone]   [facility] [address] [place] [phone], phone; [phone], fax Sees patients 1st and 3rd Saturday of every month.  Must not qualify for public or private insurance (i.e. Medicaid, Medicare, [place] Health Choice, Veterans' Benefits)  Household income  should be no more than 200% of the poverty level The clinic cannot treat you if you are pregnant or think you are pregnant  Sexually transmitted diseases are not treated at the clinic.    Dental Care: Organization         Address  Phone  Notes  [place]  [facility] [address] [place], [place] [phone] Accepts children up to age 35 who are enrolled in [place] or [place]; pregnant women with a Medicaid card; and children who have applied for Medicaid or [place] Health Choice, but were declined, whose parents can pay a reduced fee at time of service.  [facility] Department of [facility]  [address], [place] [phone] Accepts children up to age 67 who are enrolled in [place] or [place]; pregnant women with a Medicaid card; and children who have applied for Medicaid or  Health Choice, but were declined, whose parents can pay a reduced fee at time of service.  [place] Adult Dental Access PROGRAM  [place] [phone] Patients are seen by appointment only. Walk-ins are not accepted. [place] will see patients 76 years of age and older. Monday - Tuesday (8am-5pm) Most Wednesdays (8:30-5pm) $30 per visit, cash only  [facility] Adult Dental Access PROGRAM  [address], [facility] [phone] Patients are seen by appointment only. Walk-ins are not accepted. [place] will see patients 49 years of age and older. One Wednesday Evening (Monthly: Volunteer Based).  $30 per visit, cash only  [place]  [phone] for adults; Children under age 76, call Graduate Pediatric Dentistry at [phone]. Children aged 54-14, please call [phone] to request a pediatric application.  Dental services are provided in all areas of dental care including fillings, crowns and bridges, complete and partial dentures, implants, gum treatment,  root canals, and extractions. Preventive care is also provided. Treatment is provided to both adults and children. Patients are selected via a lottery and there is often a waiting list.   [facility] [address] [place]  [phone] [email or web address]   [facility] [address] [place], [place] [phone], Ext. [phone] Second and Fourth Thursday of each month, opens at 6:30 AM; Clinic ends at 9 AM.  Patients are seen on a first-come first-served basis, and a limited number are seen during each clinic.   [facility]  [address] Hillard Danker [place], [place] [phone]   Eligibility Requirements You must have lived in [place], [place], or [place] counties for at least the last three months.   You cannot be eligible for state or federal sponsored [employer], including [employer], [place], or [employer].   You generally cannot be eligible for healthcare insurance through your employer.    How to apply: Eligibility screenings are held every Tuesday and Wednesday afternoon from 1:00 pm until 4:00 pm. You do not need an appointment for the interview!  [facility] [address], [place], [place]   [place]  [place] Department  [place]  [phone]    Behavioral Health Resources in the Community: Intensive Outpatient Programs Organization         Address  Phone  Notes  [place]. [address], [place], [place] [phone]   [facility]patient [address], [place], [place]   ADS: Alcohol & Drug Svcs [address], [place], [place]   [place] [address]  [place], [place] or [phone]   Substance Abuse Resources Organization         Address  Phone  Notes  Alcohol and Drug Services   [place]  [phone]   [facility] [place]   Chinita Pester  [phone]   Residential & Outpatient Substance Abuse Program  [phone]   Psychological Services Organization         Address  Phone  Notes  [facility] [address] [place]  [place]  [phone]   [place] [address], [place] or [phone]    Mobile Crisis Teams Organization         Address  Phone  Notes  [facility], Mobile Crisis Care Unit  [phone]   [facility]otherapeutic [facility]  [address] [place], [place]   [place] Levels [address] [place] [phone]    Self-Help/Support Groups Organization         Address  Phone             Notes  [place] [place] - variety of support groups  [place] Call for more information  Narcotics Anonymous (NA), [facility] [address], [employer] [place]  2 meetings at this location   Special educational needs teacher         Address  Phone  Notes  [facility]idential [facility] [place],    [place]  [phone]   [facility]  [address] [place] 419622, [place], [place]   [place], [place] [phone] Admissions: 8am-3pm M-F  Incentives Substance [place] [address]    [place], [place] [phone]   [facility] [address] [place], [place], [place]   [facility] [address]  [place], [place]   [facility]ight Programs - Intensive Outpatient [address], [place], [place]   [facility] ([place] [address].,  [place], [place] [phone] or [phone]   [facility] Services (RTS) [address], [place], [place] Accepts Medicaid  [facility] [place] [address],  [place] [phone] Substance Abuse/Addiction Treatment   [facility] Organization         Address  Phone  Notes  [facility]  [phone]   Domenic Schwab, PhD [address] Arlis Porta [place], [place]   [phone] or [phone]   [place], [place] [phone]   [facility] [address], [place], [place] [phone] Insurance/Medicaid/sponsorship through [facility] and Families [address] [place] [employer]                                    [place], [place] [phone] [place] [address], [place] [phone]    Dr. Adele Schilder  [phone]   [facility] Dept. 1) [address] [place] 2) [place] 3)  [place] [address], [place] [phone]  [phone]   [place] [phone] or [phone] (After Hours)

## 2014-03-09 NOTE — Progress Notes (Signed)
VASCULAR LAB PRELIMINARY  PRELIMINARY  PRELIMINARY  PRELIMINARY  Right lower extremity venous Doppler completed.    Preliminary report:  There is no DVT or SVT noted in the right lower extremity.  There is a small Baker's Cyst noted in the right popliteal fossa.  KANADY, CANDACE, RVT 03/09/2014, 6:19 PM

## 2014-03-10 NOTE — ED Provider Notes (Signed)
Medical screening examination/treatment/procedure(s) were performed by non-physician practitioner and as supervising physician I was immediately available for consultation/collaboration.   Neta Ehlers, MD 03/10/14 [phone]

## 2014-03-19 ENCOUNTER — Ambulatory Visit: Payer: Self-pay | Admitting: Sports Medicine

## 2014-06-10 ENCOUNTER — Emergency Department (HOSPITAL_COMMUNITY)
Admission: EM | Admit: 2014-06-10 | Discharge: 2014-06-10 | Disposition: A | Discharge status: Home / Self Care | Payer: Self-pay | Attending: Emergency Medicine | Admitting: Emergency Medicine

## 2014-06-10 ENCOUNTER — Encounter (HOSPITAL_COMMUNITY): Payer: Self-pay | Admitting: Emergency Medicine

## 2014-06-10 ENCOUNTER — Emergency Department (HOSPITAL_COMMUNITY): Payer: Self-pay

## 2014-06-10 DIAGNOSIS — Z87891 Personal history of nicotine dependence: Secondary | ICD-10-CM | POA: Insufficient documentation

## 2014-06-10 DIAGNOSIS — J069 Acute upper respiratory infection, unspecified: Secondary | ICD-10-CM | POA: Insufficient documentation

## 2014-06-10 DIAGNOSIS — R059 Cough, unspecified: Secondary | ICD-10-CM | POA: Insufficient documentation

## 2014-06-10 DIAGNOSIS — J45909 Unspecified asthma, uncomplicated: Secondary | ICD-10-CM | POA: Insufficient documentation

## 2014-06-10 DIAGNOSIS — R05 Cough: Secondary | ICD-10-CM | POA: Insufficient documentation

## 2014-06-10 DIAGNOSIS — R0982 Postnasal drip: Secondary | ICD-10-CM | POA: Insufficient documentation

## 2014-06-10 MED ORDER — IBUPROFEN 800 MG PO TABS
800.0000 mg | ORAL_TABLET | Freq: Once | ORAL | Status: AC
  Administered 2014-06-10: 800 mg via ORAL
  Filled 2014-06-10: qty 1

## 2014-06-10 MED ORDER — NAPROXEN 375 MG PO TABS: 375.0000 mg | ORAL_TABLET | Freq: Two times a day (BID) | ORAL | Status: DC

## 2014-06-10 MED ORDER — FLUTICASONE PROPIONATE 50 MCG/ACT NA SUSP: 2.0000 | Freq: Every day | NASAL | Status: DC

## 2014-06-10 MED ORDER — BENZONATATE 100 MG PO CAPS: 100.0000 mg | ORAL_CAPSULE | Freq: Three times a day (TID) | ORAL | Status: DC

## 2014-06-10 MED ORDER — BENZONATATE 100 MG PO CAPS
200.0000 mg | ORAL_CAPSULE | Freq: Once | ORAL | Status: AC
  Administered 2014-06-10: 200 mg via ORAL
  Filled 2014-06-10: qty 2

## 2014-06-10 NOTE — ED Notes (Signed)
Pt complains of upper respiratory cold sx for four days, he states that he keeps his grandkids and they have been sick

## 2014-06-10 NOTE — ED Provider Notes (Signed)
CSN: [id number]     Arrival date & time 06/10/14  7253 History   First MD Initiated Contact with Patient 06/10/14 0430     Chief Complaint  Patient presents with  . URI     (Consider location/radiation/quality/duration/timing/severity/associated sxs/prior Treatment) Patient is a 53 y.o. male presenting with URI. The history is provided by the patient. No language interpreter was used.  URI Presenting symptoms: congestion, cough and rhinorrhea   Presenting symptoms: no fever and no sore throat   Congestion:    Location:  Nasal   Interferes with sleep: no     Interferes with eating/drinking: no   Cough:    Cough characteristics:  Non-productive   Severity:  Moderate   Onset quality:  Gradual   Duration:  4 days   Timing:  Intermittent   Progression:  Unchanged   Chronicity:  New Rhinorrhea:    Quality:  Clear   Severity:  Mild   Timing:  Constant   Progression:  Unchanged Severity:  Moderate Onset quality:  Gradual Duration:  4 days Timing:  Constant Progression:  Unchanged Chronicity:  New Relieved by:  Nothing Worsened by:  Nothing tried Ineffective treatments:  None tried Associated symptoms: no arthralgias, no neck pain and no wheezing   Risk factors: not elderly     Past Medical History  Diagnosis Date  . Knee pain   . Asthma    Past Surgical History  Procedure Laterality Date  . Knee arthroscopy      x 2   History reviewed. No pertinent family history. History  Substance Use Topics  . Smoking status: Former Research scientist (life sciences)  . Smokeless tobacco: Not on file  . Alcohol Use: No    Review of Systems  Constitutional: Negative for fever.  HENT: Positive for congestion and rhinorrhea. Negative for sore throat.   Respiratory: Positive for cough. Negative for wheezing.   Musculoskeletal: Negative for arthralgias and neck pain.  All other systems reviewed and are negative.     Allergies  Review of patient's allergies indicates no known allergies.  Home  Medications   Prior to Admission medications   Medication Sig Start Date End Date Taking? Authorizing Provider  DM-Phenylephrine-Acetaminophen (TYLENOL COLD MULTI-SYMPTOM) 10-5-325 MG/15ML LIQD Take 15 mLs by mouth every 4 (four) hours as needed (cold symptoms).   Yes Historical Provider, MD   BP 118/79  Pulse 85  Temp(Src) 98.2 F (36.8 C) (Oral)  Resp 18  Ht 5\' 6"  (1.676 m)  Wt 145 lb (65.772 kg)  BMI 23.41 kg/m2  SpO2 97% Physical Exam  Constitutional: He is oriented to person, place, and time. He appears well-developed and well-nourished. No distress.  HENT:  Head: Normocephalic and atraumatic.  Mouth/Throat: Oropharynx is clear and moist.  Cobblestoning and postnasal drip  Eyes: Conjunctivae are normal. Pupils are equal, round, and reactive to light.  Neck: Normal range of motion. Neck supple.  Cardiovascular: Normal rate, regular rhythm and intact distal pulses.   Pulmonary/Chest: Effort normal and breath sounds normal. No stridor. He has no wheezes. He has no rales.  Abdominal: Soft. Bowel sounds are normal. There is no tenderness. There is no rebound and no guarding.  Musculoskeletal: Normal range of motion.  Lymphadenopathy:    He has no cervical adenopathy.  Neurological: He is alert and oriented to person, place, and time.  Skin: Skin is warm and dry.  Psychiatric: He has a normal mood and affect.    ED Course  Procedures (including critical care time) Labs Review  Labs Reviewed - No data to display  Imaging Review Dg Chest 2 View  06/10/2014   CLINICAL DATA:  Congestion, coughing cold symptoms for 4 days.  EXAM: CHEST  2 VIEW  COMPARISON:  10/31/2012  FINDINGS: The heart size and mediastinal contours are within normal limits. Both lungs are clear. The visualized skeletal structures are unremarkable.  IMPRESSION: No active cardiopulmonary disease.   Electronically Signed   By: Lucienne Capers M.D.   On: 06/10/2014 04:13     EKG Interpretation None      MDM    Final diagnoses:  None   Will treat for postnasal drip    Rachael Ferrie K Shafter Jupin-Rasch, MD 06/10/14 [phone]

## 2014-06-10 NOTE — Discharge Instructions (Signed)
Cool Mist Vaporizers °Vaporizers may help relieve the symptoms of a cough and cold. They add moisture to the air, which helps mucus to become thinner and less sticky. This makes it easier to breathe and cough up secretions. Cool mist vaporizers do not cause serious burns like hot mist vaporizers, which may also be called steamers or humidifiers. Vaporizers have not been proven to help with colds. You should not use a vaporizer if you are allergic to mold. °HOME CARE INSTRUCTIONS °· Follow the package instructions for the vaporizer. °· Do not use anything other than distilled water in the vaporizer. °· Do not run the vaporizer all of the time. This can cause mold or bacteria to grow in the vaporizer. °· Clean the vaporizer after each time it is used. °· Clean and dry the vaporizer well before storing it. °· Stop using the vaporizer if worsening respiratory symptoms develop. °Document Released: 05/25/2004 Document Revised: 09/02/2013 Document Reviewed: 01/15/2013 °ExitCare® Patient Information ©2015 [employer]. This information is not intended to replace advice given to you by your health care provider. Make sure you discuss any questions you have with your health care provider. ° °

## 2014-07-17 ENCOUNTER — Encounter (HOSPITAL_COMMUNITY): Payer: Self-pay | Admitting: Emergency Medicine

## 2014-07-17 ENCOUNTER — Emergency Department (HOSPITAL_COMMUNITY)
Admission: EM | Admit: 2014-07-17 | Discharge: 2014-07-17 | Discharge status: Against Medical Advice | Payer: Self-pay | Attending: Emergency Medicine | Admitting: Emergency Medicine

## 2014-07-17 DIAGNOSIS — J45909 Unspecified asthma, uncomplicated: Secondary | ICD-10-CM | POA: Insufficient documentation

## 2014-07-17 DIAGNOSIS — M549 Dorsalgia, unspecified: Secondary | ICD-10-CM | POA: Insufficient documentation

## 2014-07-17 NOTE — ED Notes (Signed)
Pt. reports generalized body aches / back pain onset 2 weeks ago , denies fever or chills , no fall or injury , respirations unlabored / ambulatory .

## 2014-10-14 ENCOUNTER — Emergency Department (HOSPITAL_COMMUNITY)
Admission: EM | Admit: 2014-10-14 | Discharge: 2014-10-14 | Disposition: A | Discharge status: Home / Self Care | Payer: Self-pay | Attending: Emergency Medicine | Admitting: Emergency Medicine

## 2014-10-14 ENCOUNTER — Encounter (HOSPITAL_COMMUNITY): Payer: Self-pay | Admitting: Emergency Medicine

## 2014-10-14 DIAGNOSIS — J069 Acute upper respiratory infection, unspecified: Secondary | ICD-10-CM | POA: Insufficient documentation

## 2014-10-14 DIAGNOSIS — Z7951 Long term (current) use of inhaled steroids: Secondary | ICD-10-CM | POA: Insufficient documentation

## 2014-10-14 DIAGNOSIS — J45909 Unspecified asthma, uncomplicated: Secondary | ICD-10-CM | POA: Insufficient documentation

## 2014-10-14 DIAGNOSIS — Z791 Long term (current) use of non-steroidal anti-inflammatories (NSAID): Secondary | ICD-10-CM | POA: Insufficient documentation

## 2014-10-14 DIAGNOSIS — Z87891 Personal history of nicotine dependence: Secondary | ICD-10-CM | POA: Insufficient documentation

## 2014-10-14 MED ORDER — ALBUTEROL SULFATE HFA 108 (90 BASE) MCG/ACT IN AERS
2.0000 | INHALATION_SPRAY | RESPIRATORY_TRACT | Status: DC | PRN
  Administered 2014-10-14: 2 via RESPIRATORY_TRACT
  Filled 2014-10-14: qty 6.7

## 2014-10-14 MED ORDER — HYDROCODONE-HOMATROPINE 5-1.5 MG/5ML PO SYRP: 5.0000 mL | ORAL_SOLUTION | Freq: Four times a day (QID) | ORAL | Status: DC | PRN

## 2014-10-14 NOTE — ED Notes (Signed)
PA at bedside.

## 2014-10-14 NOTE — ED Notes (Signed)
Pt. reports productive cough with nasal congestion/runny nose and sneezing onset this week . Denies fever or chills.

## 2014-10-14 NOTE — Discharge Instructions (Signed)
Upper Respiratory Infection, Adult An upper respiratory infection (URI) is also sometimes known as the common cold. The upper respiratory tract includes the nose, sinuses, throat, trachea, and bronchi. Bronchi are the airways leading to the lungs. Most people improve within 1 week, but symptoms can last up to 2 weeks. A residual cough may last even longer.  CAUSES Many different viruses can infect the tissues lining the upper respiratory tract. The tissues become irritated and inflamed and often become very moist. Mucus production is also common. A cold is contagious. You can easily spread the virus to others by oral contact. This includes kissing, sharing a glass, coughing, or sneezing. Touching your mouth or nose and then touching a surface, which is then touched by another person, can also spread the virus. SYMPTOMS  Symptoms typically develop 1 to 3 days after you come in contact with a cold virus. Symptoms vary from person to person. They may include:  Runny nose.  Sneezing.  Nasal congestion.  Sinus irritation.  Sore throat.  Loss of voice (laryngitis).  Cough.  Fatigue.  Muscle aches.  Loss of appetite.  Headache.  Low-grade fever. DIAGNOSIS  You might diagnose your own cold based on familiar symptoms, since most people get a cold 2 to 3 times a year. Your caregiver can confirm this based on your exam. Most importantly, your caregiver can check that your symptoms are not due to another disease such as strep throat, sinusitis, pneumonia, asthma, or epiglottitis. Blood tests, throat tests, and X-rays are not necessary to diagnose a common cold, but they may sometimes be helpful in excluding other more serious diseases. Your caregiver will decide if any further tests are required. RISKS AND COMPLICATIONS  You may be at risk for a more severe case of the common cold if you smoke cigarettes, have chronic heart disease (such as heart failure) or lung disease (such as asthma), or if  you have a weakened immune system. The very young and very old are also at risk for more serious infections. Bacterial sinusitis, middle ear infections, and bacterial pneumonia can complicate the common cold. The common cold can worsen asthma and chronic obstructive pulmonary disease (COPD). Sometimes, these complications can require emergency medical care and may be life-threatening. PREVENTION  The best way to protect against getting a cold is to practice good hygiene. Avoid oral or hand contact with people with cold symptoms. Wash your hands often if contact occurs. There is no clear evidence that vitamin C, vitamin E, echinacea, or exercise reduces the chance of developing a cold. However, it is always recommended to get plenty of rest and practice good nutrition. TREATMENT  Treatment is directed at relieving symptoms. There is no cure. Antibiotics are not effective, because the infection is caused by a virus, not by bacteria. Treatment may include:  Increased fluid intake. Sports drinks offer valuable electrolytes, sugars, and fluids.  Breathing heated mist or steam (vaporizer or shower).  Eating chicken soup or other clear broths, and maintaining good nutrition.  Getting plenty of rest.  Using gargles or lozenges for comfort.  Controlling fevers with ibuprofen or acetaminophen as directed by your caregiver.  Increasing usage of your inhaler if you have asthma. Zinc gel and zinc lozenges, taken in the first 24 hours of the common cold, can shorten the duration and lessen the severity of symptoms. Pain medicines may help with fever, muscle aches, and throat pain. A variety of non-prescription medicines are available to treat congestion and runny nose. Your caregiver   can make recommendations and may suggest nasal or lung inhalers for other symptoms.  HOME CARE INSTRUCTIONS   Only take over-the-counter or prescription medicines for pain, discomfort, or fever as directed by your  caregiver.  Use a warm mist humidifier or inhale steam from a shower to increase air moisture. This may keep secretions moist and make it easier to breathe.  Drink enough water and fluids to keep your urine clear or pale yellow.  Rest as needed.  Return to work when your temperature has returned to normal or as your caregiver advises. You may need to stay home longer to avoid infecting others. You can also use a face mask and careful hand washing to prevent spread of the virus. SEEK MEDICAL CARE IF:   After the first few days, you feel you are getting worse rather than better.  You need your caregiver's advice about medicines to control symptoms.  You develop chills, worsening shortness of breath, or brown or red sputum. These may be signs of pneumonia.  You develop yellow or brown nasal discharge or pain in the face, especially when you bend forward. These may be signs of sinusitis.  You develop a fever, swollen neck glands, pain with swallowing, or white areas in the back of your throat. These may be signs of strep throat. SEEK IMMEDIATE MEDICAL CARE IF:   You have a fever.  You develop severe or persistent headache, ear pain, sinus pain, or chest pain.  You develop wheezing, a prolonged cough, cough up blood, or have a change in your usual mucus (if you have chronic lung disease).  You develop sore muscles or a stiff neck. Document Released: 02/21/2001 Document Revised: 11/20/2011 Document Reviewed: 12/03/2013 ExitCare Patient Information 2015 [employer]. This information is not intended to replace advice given to you by your health care provider. Make sure you discuss any questions you have with your health care provider.  

## 2014-10-14 NOTE — ED Provider Notes (Signed)
CSN: [id number]     Arrival date & time 10/14/14  0026 History   First MD Initiated Contact with Patient 10/14/14 0030     No chief complaint on file.    (Consider location/radiation/quality/duration/timing/severity/associated sxs/prior Treatment) HPI Comments: Patient with past medical history of asthma, presents to the emergency department with chief complaint of cough. He reports associated nasal congestion, runny nose, sneezing, and sore throat that started 3 days ago. He denies any fevers chills. Denies any productive cough. Denies any chest pain or shortness breath. Denies any wheezing. He has not tried taking anything to alleviate his symptoms. States that he would like to have something to help him sleep tonight. There are no aggravating or relieving factors.  The history is provided by the patient. No language interpreter was used.    Past Medical History  Diagnosis Date  . Knee pain   . Asthma    Past Surgical History  Procedure Laterality Date  . Knee arthroscopy      x 2   No family history on file. History  Substance Use Topics  . Smoking status: Former Research scientist (life sciences)  . Smokeless tobacco: Not on file  . Alcohol Use: No    Review of Systems  Constitutional: Negative for fever and chills.  HENT: Positive for postnasal drip, rhinorrhea, sinus pressure, sneezing and sore throat.   Respiratory: Positive for cough. Negative for shortness of breath.   Cardiovascular: Negative for chest pain.  Gastrointestinal: Negative for nausea, vomiting, abdominal pain, diarrhea and constipation.  Genitourinary: Negative for dysuria.      Allergies  Review of patient's allergies indicates no known allergies.  Home Medications   Prior to Admission medications   Medication Sig Start Date End Date Taking? Authorizing Provider  benzonatate (TESSALON) 100 MG capsule Take 1 capsule (100 mg total) by mouth every 8 (eight) hours. 06/10/14   April K Palumbo-Rasch, MD   DM-Phenylephrine-Acetaminophen (TYLENOL COLD MULTI-SYMPTOM) 10-5-325 MG/15ML LIQD Take 15 mLs by mouth every 4 (four) hours as needed (cold symptoms).    Historical Provider, MD  fluticasone (FLONASE) 50 MCG/ACT nasal spray Place 2 sprays into both nostrils daily. 06/10/14   April K Palumbo-Rasch, MD  HYDROcodone-homatropine [facility]) 5-1.5 MG/5ML syrup Take 5 mLs by mouth every 6 (six) hours as needed for cough. 10/14/14   Montine Circle, PA-C  naproxen (NAPROSYN) 375 MG tablet Take 1 tablet (375 mg total) by mouth 2 (two) times daily. 06/10/14   April K Palumbo-Rasch, MD   BP 128/82 mmHg  Pulse 103  Temp(Src) 98.2 F (36.8 C) (Oral)  Resp 20  SpO2 98% Physical Exam  Constitutional: He appears well-developed and well-nourished. No distress.  HENT:  Head: Normocephalic.  Right Ear: External ear normal.  Left Ear: External ear normal.  Mildly erythematous, no tonsillar exudate, no abscess, no stridor, uvula is midline  TMs clear bilaterally  Eyes: Conjunctivae and EOM are normal. Pupils are equal, round, and reactive to light.  Neck: Normal range of motion. Neck supple.  Cardiovascular: Normal rate, regular rhythm and normal heart sounds.  Exam reveals no gallop and no friction rub.   No murmur heard. Pulmonary/Chest: Effort normal and breath sounds normal. No stridor. No respiratory distress. He has no wheezes. He has no rales. He exhibits no tenderness.  CTAB  Abdominal: Soft. Bowel sounds are normal. He exhibits no distension. There is no tenderness.  Musculoskeletal: Normal range of motion. He exhibits no tenderness.  Neurological: He is alert.  Skin: Skin is warm and dry.  No rash noted. He is not diaphoretic.  Psychiatric: He has a normal mood and affect. His behavior is normal. Judgment and thought content normal.  Nursing note and vitals reviewed.   ED Course  Procedures (including critical care time) Labs Review Labs Reviewed - No data to display  Imaging Review No  results found.   EKG Interpretation None      MDM   Final diagnoses:  URI (upper respiratory infection)   Patient with probable URI. Symptoms started 3 days ago. No fevers. No productive cough. Lungs are clear. Will treat with Hycodan, fluids, and and close follow-up with primary care. Will also give the patient inhaler the emergency department given history of asthma. Patient understands and agrees with the plan. He is stable and ready for discharge.    Montine Circle, PA-C 10/14/14 4401  Debby Freiberg, MD 10/14/14 [phone]

## 2015-02-10 ENCOUNTER — Emergency Department (HOSPITAL_COMMUNITY)
Admission: EM | Admit: 2015-02-10 | Discharge: 2015-02-11 | Disposition: A | Discharge status: Home / Self Care | Payer: Self-pay | Attending: Emergency Medicine | Admitting: Emergency Medicine

## 2015-02-10 ENCOUNTER — Encounter (HOSPITAL_COMMUNITY): Payer: Self-pay | Admitting: *Deleted

## 2015-02-10 ENCOUNTER — Emergency Department (HOSPITAL_COMMUNITY): Payer: Self-pay

## 2015-02-10 DIAGNOSIS — R05 Cough: Secondary | ICD-10-CM

## 2015-02-10 DIAGNOSIS — J069 Acute upper respiratory infection, unspecified: Secondary | ICD-10-CM | POA: Insufficient documentation

## 2015-02-10 DIAGNOSIS — Z7951 Long term (current) use of inhaled steroids: Secondary | ICD-10-CM | POA: Insufficient documentation

## 2015-02-10 DIAGNOSIS — J45901 Unspecified asthma with (acute) exacerbation: Secondary | ICD-10-CM | POA: Insufficient documentation

## 2015-02-10 DIAGNOSIS — R51 Headache: Secondary | ICD-10-CM | POA: Insufficient documentation

## 2015-02-10 DIAGNOSIS — Z87891 Personal history of nicotine dependence: Secondary | ICD-10-CM | POA: Insufficient documentation

## 2015-02-10 DIAGNOSIS — R059 Cough, unspecified: Secondary | ICD-10-CM

## 2015-02-10 DIAGNOSIS — Z791 Long term (current) use of non-steroidal anti-inflammatories (NSAID): Secondary | ICD-10-CM | POA: Insufficient documentation

## 2015-02-10 DIAGNOSIS — R519 Headache, unspecified: Secondary | ICD-10-CM

## 2015-02-10 MED ORDER — PREDNISONE 20 MG PO TABS: ORAL_TABLET | ORAL | Status: DC

## 2015-02-10 MED ORDER — ALBUTEROL SULFATE HFA 108 (90 BASE) MCG/ACT IN AERS: 2.0000 | INHALATION_SPRAY | RESPIRATORY_TRACT | Status: DC | PRN

## 2015-02-10 MED ORDER — NAPROXEN 500 MG PO TABS: 500.0000 mg | ORAL_TABLET | Freq: Two times a day (BID) | ORAL | Status: DC | PRN

## 2015-02-10 MED ORDER — ALBUTEROL SULFATE (2.5 MG/3ML) 0.083% IN NEBU
5.0000 mg | INHALATION_SOLUTION | Freq: Once | RESPIRATORY_TRACT | Status: AC
  Administered 2015-02-10: 5 mg via RESPIRATORY_TRACT
  Filled 2015-02-10: qty 6

## 2015-02-10 MED ORDER — IPRATROPIUM BROMIDE 0.02 % IN SOLN
0.5000 mg | Freq: Once | RESPIRATORY_TRACT | Status: AC
  Administered 2015-02-10: 0.5 mg via RESPIRATORY_TRACT
  Filled 2015-02-10: qty 2.5

## 2015-02-10 MED ORDER — PREDNISONE 20 MG PO TABS
60.0000 mg | ORAL_TABLET | Freq: Once | ORAL | Status: AC
  Administered 2015-02-10: 60 mg via ORAL
  Filled 2015-02-10: qty 3

## 2015-02-10 NOTE — ED Provider Notes (Signed)
CSN: [id number]     Arrival date & time 02/10/15  2212 History  This chart was scribed for [employer], PA-C, working with Fredia Sorrow, MD by Steva Colder, ED Scribe. The patient was seen in room TR11C/TR11C at 10:35 PM.    Chief Complaint  Patient presents with  . Headache  . Cough      Patient is a 54 y.o. male presenting with headaches and cough. The history is provided by the patient. No language interpreter was used.  Headache Pain location:  Frontal Quality: throbbing. Radiates to:  Does not radiate Severity currently:  8/10 Severity at highest:  8/10 Onset quality:  Gradual Duration:  3 days Timing:  Intermittent Progression:  Improving Chronicity:  New Similar to prior headaches: yes   Context: coughing   Relieved by:  NSAIDs Worsened by:  Light Ineffective treatments:  None tried Associated symptoms: cough, myalgias, sore throat and URI   Associated symptoms: no abdominal pain, no blurred vision, no congestion, no diarrhea, no dizziness, no drainage, no ear pain, no eye pain, no fever, no focal weakness, no nausea, no neck pain, no neck stiffness, no numbness, no sinus pressure, no visual change, no vomiting and no weakness   Cough:    Cough characteristics:  Productive   Sputum characteristics:  Green   Severity:  Moderate   Onset quality:  Sudden   Duration:  4 days   Timing:  Intermittent   Progression:  Unchanged   Chronicity:  New Cough Severity:  Moderate Onset quality:  Sudden Duration:  3 days Progression:  Unchanged Chronicity:  New Smoker: no   Context: upper respiratory infection   Relieved by:  Nothing Worsened by:  Nothing tried Ineffective treatments: advil. Associated symptoms: headaches (mild), myalgias, rhinorrhea and sore throat   Associated symptoms: no chest pain, no chills, no ear pain, no eye discharge, no fever, no rash, no shortness of breath and no wheezing      Zachary [name] is a 54 y.o. male with a medical hx  of knee pain and asthma, who presents to the Emergency department complaining of cough and HA onset 3 days. He reports that the cough is prodcutive of green sputum. Pt states his HA is an 8/10, intermittent, throbbing, localized to frontal area, and it does not radiate. He states that coughing worsens his pain. He states that he has tried advil with mild relief for his symptoms. Reports this is the same as his typical headaches. He states that he is having associated symptoms of  clear rhinorrhea, and generalized myalgias.  He denies nasal congestion, fevers, chills, vision changes, dizziness, lightheadedness, ear pain/drainage, eye drainage, wheezing, SOB, CP, abdominal pain, n/v/d, numbness, tingling, weakness, and any other symptoms. Reports that his grandchild has been sick. Denies smoking. Denies medical hx of DM.    Past Medical History  Diagnosis Date  . Knee pain   . Asthma    Past Surgical History  Procedure Laterality Date  . Knee arthroscopy      x 2   No family history on file. History  Substance Use Topics  . Smoking status: Former Research scientist (life sciences)  . Smokeless tobacco: Not on file  . Alcohol Use: No    Review of Systems  Constitutional: Negative for fever and chills.  HENT: Positive for rhinorrhea and sore throat. Negative for congestion, drooling, ear discharge, ear pain, postnasal drip, sinus pressure and trouble swallowing.   Eyes: Negative for blurred vision, pain, discharge and visual disturbance.  Respiratory:  Positive for cough. Negative for shortness of breath and wheezing.   Cardiovascular: Negative for chest pain.  Gastrointestinal: Negative for nausea, vomiting, abdominal pain and diarrhea.  Musculoskeletal: Positive for myalgias. Negative for arthralgias, neck pain and neck stiffness.  Skin: Negative for rash.  Allergic/Immunologic: Negative for immunocompromised state.  Neurological: Positive for headaches (mild). Negative for dizziness, focal weakness, weakness,  light-headedness and numbness.  Hematological: Does not bruise/bleed easily.  Psychiatric/Behavioral: Negative for confusion.   10 Systems reviewed and are negative for acute change except as noted in the HPI.    Allergies  Review of patient's allergies indicates no known allergies.  Home Medications   Prior to Admission medications   Medication Sig Start Date End Date Taking? Authorizing Provider  benzonatate (TESSALON) 100 MG capsule Take 1 capsule (100 mg total) by mouth every 8 (eight) hours. 06/10/14   April Palumbo, MD  DM-Phenylephrine-Acetaminophen (TYLENOL COLD MULTI-SYMPTOM) 10-5-325 MG/15ML LIQD Take 15 mLs by mouth every 4 (four) hours as needed (cold symptoms).    Historical Provider, MD  fluticasone (FLONASE) 50 MCG/ACT nasal spray Place 2 sprays into both nostrils daily. 06/10/14   April Palumbo, MD  HYDROcodone-homatropine [facility]) 5-1.5 MG/5ML syrup Take 5 mLs by mouth every 6 (six) hours as needed for cough. 10/14/14   Montine Circle, PA-C  naproxen (NAPROSYN) 375 MG tablet Take 1 tablet (375 mg total) by mouth 2 (two) times daily. 06/10/14   April Palumbo, MD   BP 117/73 mmHg  Pulse 85  Temp(Src) 98 F (36.7 C) (Oral)  Resp 18  Ht 5\' 6"  (1.676 m)  Wt 145 lb (65.772 kg)  BMI 23.41 kg/m2  SpO2 98% Physical Exam  Constitutional: He is oriented to person, place, and time. Vital signs are normal. He appears well-developed and well-nourished.  Non-toxic appearance. No distress.  Afebrile, nontoxic, NAD  HENT:  Head: Normocephalic and atraumatic.  Nose: Mucosal edema and rhinorrhea present. Right sinus exhibits no maxillary sinus tenderness and no frontal sinus tenderness. Left sinus exhibits no maxillary sinus tenderness and no frontal sinus tenderness.  Mouth/Throat: Uvula is midline, oropharynx is clear and moist and mucous membranes are normal. No trismus in the jaw. No uvula swelling.  Ears are clear bilaterally. Nose with mild mucosal edema and clear rhinorrhea. No  sinus TTP. Oropharynx clear and moist, without uvular swelling or deviation, no trismus or drooling, no tonsillar swelling or erythema, no exudates.   Eyes: Conjunctivae and EOM are normal. Pupils are equal, round, and reactive to light. Right eye exhibits no discharge. Left eye exhibits no discharge.  PERRL, EOMI, no nystagmus, no visual field deficits  Neck: Normal range of motion. Neck supple.  Cardiovascular: Normal rate, regular rhythm, normal heart sounds and intact distal pulses.  Exam reveals no gallop and no friction rub.   No murmur heard. Pulmonary/Chest: Effort normal. No respiratory distress. He has no decreased breath sounds. He has wheezes. He has no rhonchi. He has no rales.   Mild wheezing diffusely with coarse lung sounds, no rhonchi or rales, no hypoxia or increased WOB, speaking in full sentences, SpO2 98% on RA   Abdominal: Soft. Normal appearance and bowel sounds are normal. He exhibits no distension. There is no tenderness. There is no rigidity, no rebound and no guarding.  Musculoskeletal: Normal range of motion.  Lymphadenopathy:       Head (right side): No submandibular and no tonsillar adenopathy present.       Head (left side): No submandibular and no tonsillar adenopathy present.  He has cervical adenopathy.  Bilateral cervical LAD which is mildly TTP.   Neurological: He is alert and oriented to person, place, and time. He has normal strength. No cranial nerve deficit or sensory deficit. Gait normal. GCS eye subscore is 4. GCS verbal subscore is 5. GCS motor subscore is 6.  CN 2-12 grossly intact A&O x4 GCS 15 Sensation and strength intact Gait nonataxic   Skin: Skin is warm, dry and intact. No rash noted.  Psychiatric: He has a normal mood and affect.  Nursing note and vitals reviewed.   ED Course  Procedures (including critical care time) DIAGNOSTIC STUDIES: Oxygen Saturation is 98% on RA, nl by my interpretation.    COORDINATION OF CARE: 10:45  PM-Discussed treatment plan which includes breathing treatment and CXR with pt at bedside and pt agreed to plan.   Labs Review Labs Reviewed - No data to display  Imaging Review Dg Chest 2 View  02/10/2015   CLINICAL DATA:  Cough, chills and runny nose for few days.  EXAM: CHEST  2 VIEW  COMPARISON:  06/10/2014  FINDINGS: Borderline hyperinflation and prominent interstitial markings. The cardiomediastinal contours are normal. Pulmonary vasculature is normal. No consolidation, pleural effusion, or pneumothorax. No acute osseous abnormalities are seen.  IMPRESSION: No acute pulmonary process. Stable borderline hyperinflation and chronic bronchitic change.   Electronically Signed   By: Jeb Levering M.D.   On: 02/10/2015 23:35     EKG Interpretation None      MDM   Final diagnoses:  Cough  Asthma exacerbation  URI (upper respiratory infection)  Nonintractable episodic headache, unspecified headache type    54 y.o. male here with URI symptoms and sinus headache. Mild headache without red flag s/sx, nonfocal neuro exam, doubt need for further imaging. Lung exam with mild wheezing, hx of asthma therefore will get CXR and give nebs and prednisone. Will reassess shortly. Declines pain meds here, states he had ibuprofen before he came and this helped his headache.  11:43 PM Lung sounds improved. CXR showing chronic bronchitic changes c/w asthma. Will treat with inhaler and prednisone, and naprosyn for headaches. Discussed symptomatic control with flonase and OTC antihistamine. Will have him f/up with [place] in 1wk for recheck, doubt need for abx now but discussed that if symptoms worsen or fevers develop, could indicate bacterial process and may warrant abx. I explained the diagnosis and have given explicit precautions to return to the ER including for any other new or worsening symptoms. The patient understands and accepts the medical plan as it's been dictated and I have answered their questions.  Discharge instructions concerning home care and prescriptions have been given. The patient is STABLE and is discharged to home in good condition.   I personally performed the services described in this documentation, which was scribed in my presence. The recorded information has been reviewed and is accurate.  BP 121/72 mmHg  Pulse 66  Temp(Src) 97.8 F (36.6 C) (Oral)  Resp 15  Ht 5\' 6"  (1.676 m)  Wt 145 lb (65.772 kg)  BMI 23.41 kg/m2  SpO2 99%  Meds ordered this encounter  Medications  . albuterol (PROVENTIL) (2.5 MG/3ML) 0.083% nebulizer solution 5 mg    Sig:   . ipratropium (ATROVENT) nebulizer solution 0.5 mg    Sig:   . predniSONE (DELTASONE) tablet 60 mg    Sig:   . albuterol (PROVENTIL HFA;VENTOLIN HFA) 108 (90 BASE) MCG/ACT inhaler    Sig: Inhale 2 puffs into the lungs every 2 (  two) hours as needed for wheezing or shortness of breath (cough).    Dispense:  1 Inhaler    Refill:  0    Order Specific Question:  Supervising Provider    Answer:  MILLER, BRIAN [[id number]]  . naproxen (NAPROSYN) 500 MG tablet    Sig: Take 1 tablet (500 mg total) by mouth 2 (two) times daily as needed for mild pain, moderate pain or headache (TAKE WITH MEALS.).    Dispense:  20 tablet    Refill:  0    Order Specific Question:  Supervising Provider    Answer:  MILLER, BRIAN [[id number]]  . predniSONE (DELTASONE) 20 MG tablet    Sig: 3 tabs po daily x 4 days    Dispense:  12 tablet    Refill:  0    Order Specific Question:  Supervising Provider    Answer:  Noemi Chapel [[id number]]      Davione Lenker Camprubi-Soms, PA-C 02/11/15 0011  Fredia Sorrow, MD 02/11/15 [phone]

## 2015-02-10 NOTE — Discharge Instructions (Signed)
Continue to stay well-hydrated. Gargle warm salt water and spit it out. Continue to alternate between Tylenol and naprosyn for pain or fever. Use Mucinex for cough suppression/expectoration of mucus. Use netipot and flonase to help with nasal congestion. May consider over-the-counter Benadryl or other antihistamine to decrease secretions and for watery itchy eyes. Use inhaler as directed, as needed for cough/chest congestion. Use prednisone as directed for asthma exacerbation. Followup with [place] [facility] in 5-7 days for recheck of ongoing symptoms and ongoing medical care. Return to emergency department for emergent changing or worsening of symptoms.   Asthma Asthma is a recurring condition in which the airways tighten and narrow. Asthma can make it difficult to breathe. It can cause coughing, wheezing, and shortness of breath. Asthma episodes, also called asthma attacks, range from minor to life-threatening. Asthma cannot be cured, but medicines and lifestyle changes can help control it. CAUSES Asthma is believed to be caused by inherited (genetic) and environmental factors, but its exact cause is unknown. Asthma may be triggered by allergens, lung infections, or irritants in the air. Asthma triggers are different for each person. Common triggers include:   Animal dander.  Dust mites.  Cockroaches.  Pollen from trees or grass.  Mold.  Smoke.  Air pollutants such as dust, household cleaners, hair sprays, aerosol sprays, paint fumes, strong chemicals, or strong odors.  Cold air, weather changes, and winds (which increase molds and pollens in the air).  Strong emotional expressions such as crying or laughing hard.  Stress.  Certain medicines (such as aspirin) or types of drugs (such as beta-blockers).  Sulfites in foods and drinks. Foods and drinks that may contain sulfites include dried fruit, potato chips, and sparkling grape juice.  Infections or inflammatory conditions  such as the flu, a cold, or an inflammation of the nasal membranes (rhinitis).  Gastroesophageal reflux disease (GERD).  Exercise or strenuous activity. SYMPTOMS Symptoms may occur immediately after asthma is triggered or many hours later. Symptoms include:  Wheezing.  Excessive nighttime or early morning coughing.  Frequent or severe coughing with a common cold.  Chest tightness.  Shortness of breath. DIAGNOSIS  The diagnosis of asthma is made by a review of your medical history and a physical exam. Tests may also be performed. These may include:  Lung function studies. These tests show how much air you breathe in and out.  Allergy tests.  Imaging tests such as X-rays. TREATMENT  Asthma cannot be cured, but it can usually be controlled. Treatment involves identifying and avoiding your asthma triggers. It also involves medicines. There are 2 classes of medicine used for asthma treatment:   Controller medicines. These prevent asthma symptoms from occurring. They are usually taken every day.  Reliever or rescue medicines. These quickly relieve asthma symptoms. They are used as needed and provide short-term relief. Your health care provider will help you create an asthma action plan. An asthma action plan is a written plan for managing and treating your asthma attacks. It includes a list of your asthma triggers and how they may be avoided. It also includes information on when medicines should be taken and when their dosage should be changed. An action plan may also involve the use of a device called a peak flow meter. A peak flow meter measures how well the lungs are working. It helps you monitor your condition. HOME CARE INSTRUCTIONS   Take medicines only as directed by your health care provider. Speak with your health care provider if you  have questions about how or when to take the medicines.  Use a peak flow meter as directed by your health care provider. Record and keep track of  readings.  Understand and use the action plan to help minimize or stop an asthma attack without needing to seek medical care.  Control your home environment in the following ways to help prevent asthma attacks:  Do not smoke. Avoid being exposed to secondhand smoke.  Change your heating and air conditioning filter regularly.  Limit your use of fireplaces and wood stoves.  Get rid of pests (such as roaches and mice) and their droppings.  Throw away plants if you see mold on them.  Clean your floors and dust regularly. Use unscented cleaning products.  Try to have someone else vacuum for you regularly. Stay out of rooms while they are being vacuumed and for a short while afterward. If you vacuum, use a dust mask from a hardware store, a double-layered or microfilter vacuum cleaner bag, or a vacuum cleaner with a HEPA filter.  Replace carpet with wood, tile, or vinyl flooring. Carpet can trap dander and dust.  Use allergy-proof pillows, mattress covers, and box spring covers.  Wash bed sheets and blankets every week in hot water and dry them in a dryer.  Use blankets that are made of polyester or cotton.  Clean bathrooms and kitchens with bleach. If possible, have someone repaint the walls in these rooms with mold-resistant paint. Keep out of the rooms that are being cleaned and painted.  Wash hands frequently. SEEK MEDICAL CARE IF:   You have wheezing, shortness of breath, or a cough even if taking medicine to prevent attacks.  The colored mucus you cough up (sputum) is thicker than usual.  Your sputum changes from clear or white to yellow, green, gray, or bloody.  You have any problems that may be related to the medicines you are taking (such as a rash, itching, swelling, or trouble breathing).  You are using a reliever medicine more than 2-3 times per week.  Your peak flow is still at 50-79% of your personal best after following your action plan for 1 hour.  You have a  fever. SEEK IMMEDIATE MEDICAL CARE IF:   You seem to be getting worse and are unresponsive to treatment during an asthma attack.  You are short of breath even at rest.  You get short of breath when doing very little physical activity.  You have difficulty eating, drinking, or talking due to asthma symptoms.  You develop chest pain.  You develop a fast heartbeat.  You have a bluish color to your lips or fingernails.  You are light-headed, dizzy, or faint.  Your peak flow is less than 50% of your personal best. MAKE SURE YOU:   Understand these instructions.  Will watch your condition.  Will get help right away if you are not doing well or get worse. Document Released: 08/28/2005 Document Revised: 01/12/2014 Document Reviewed: 03/27/2013 [facility] Patient Information 2015 [place], [place]. This information is not intended to replace advice given to you by your health care provider. Make sure you discuss any questions you have with your health care provider.  Cough, Adult  A cough is a reflex. It helps you clear your throat and airways. A cough can help heal your body. A cough can last 2 or 3 weeks (acute) or may last more than 8 weeks (chronic). Some common causes of a cough can include an infection, allergy, or a cold. HOME  CARE  Only take medicine as told by your doctor.  If given, take your medicines (antibiotics) as told. Finish them even if you start to feel better.  Use a cold steam vaporizer or humidifier in your home. This can help loosen thick spit (secretions).  Sleep so you are almost sitting up (semi-upright). Use pillows to do this. This helps reduce coughing.  Rest as needed.  Stop smoking if you smoke. GET HELP RIGHT AWAY IF:  You have yellowish-white fluid (pus) in your thick spit.  Your cough gets worse.  Your medicine does not reduce coughing, and you are losing sleep.  You cough up blood.  You have trouble breathing.  Your pain gets worse and  medicine does not help.  You have a fever. MAKE SURE YOU:   Understand these instructions.  Will watch your condition.  Will get help right away if you are not doing well or get worse. Document Released: 05/11/2011 Document Revised: 01/12/2014 Document Reviewed: 05/11/2011 [facility] Patient Information 2015 [place], [place]. This information is not intended to replace advice given to you by your health care provider. Make sure you discuss any questions you have with your health care provider.  Upper Respiratory Infection, Adult An upper respiratory infection (URI) is also sometimes known as the common cold. The upper respiratory tract includes the nose, sinuses, throat, trachea, and bronchi. Bronchi are the airways leading to the lungs. Most people improve within 1 week, but symptoms can last up to 2 weeks. A residual cough may last even longer.  CAUSES Many different viruses can infect the tissues lining the upper respiratory tract. The tissues become irritated and inflamed and often become very moist. Mucus production is also common. A cold is contagious. You can easily spread the virus to others by oral contact. This includes kissing, sharing a glass, coughing, or sneezing. Touching your mouth or nose and then touching a surface, which is then touched by another person, can also spread the virus. SYMPTOMS  Symptoms typically develop 1 to 3 days after you come in contact with a cold virus. Symptoms vary from person to person. They may include:  Runny nose.  Sneezing.  Nasal congestion.  Sinus irritation.  Sore throat.  Loss of voice (laryngitis).  Cough.  Fatigue.  Muscle aches.  Loss of appetite.  Headache.  Low-grade fever. DIAGNOSIS  You might diagnose your own cold based on familiar symptoms, since most people get a cold 2 to 3 times a year. Your caregiver can confirm this based on your exam. Most importantly, your caregiver can check that your symptoms are not due to  another disease such as strep throat, sinusitis, pneumonia, asthma, or epiglottitis. Blood tests, throat tests, and X-rays are not necessary to diagnose a common cold, but they may sometimes be helpful in excluding other more serious diseases. Your caregiver will decide if any further tests are required. RISKS AND COMPLICATIONS  You may be at risk for a more severe case of the common cold if you smoke cigarettes, have chronic heart disease (such as heart failure) or lung disease (such as asthma), or if you have a weakened immune system. The very young and very old are also at risk for more serious infections. Bacterial sinusitis, middle ear infections, and bacterial pneumonia can complicate the common cold. The common cold can worsen asthma and chronic obstructive pulmonary disease (COPD). Sometimes, these complications can require emergency medical care and may be life-threatening. PREVENTION  The best way to protect against getting a  cold is to practice good hygiene. Avoid oral or hand contact with people with cold symptoms. Wash your hands often if contact occurs. There is no clear evidence that vitamin C, vitamin E, echinacea, or exercise reduces the chance of developing a cold. However, it is always recommended to get plenty of rest and practice good nutrition. TREATMENT  Treatment is directed at relieving symptoms. There is no cure. Antibiotics are not effective, because the infection is caused by a virus, not by bacteria. Treatment may include:  Increased fluid intake. Sports drinks offer valuable electrolytes, sugars, and fluids.  Breathing heated mist or steam (vaporizer or shower).  Eating chicken soup or other clear broths, and maintaining good nutrition.  Getting plenty of rest.  Using gargles or lozenges for comfort.  Controlling fevers with ibuprofen or acetaminophen as directed by your caregiver.  Increasing usage of your inhaler if you have asthma. Zinc gel and zinc lozenges,  taken in the first 24 hours of the common cold, can shorten the duration and lessen the severity of symptoms. Pain medicines may help with fever, muscle aches, and throat pain. A variety of non-prescription medicines are available to treat congestion and runny nose. Your caregiver can make recommendations and may suggest nasal or lung inhalers for other symptoms.  HOME CARE INSTRUCTIONS   Only take over-the-counter or prescription medicines for pain, discomfort, or fever as directed by your caregiver.  Use a warm mist humidifier or inhale steam from a shower to increase air moisture. This may keep secretions moist and make it easier to breathe.  Drink enough water and fluids to keep your urine clear or pale yellow.  Rest as needed.  Return to work when your temperature has returned to normal or as your caregiver advises. You may need to stay home longer to avoid infecting others. You can also use a face mask and careful hand washing to prevent spread of the virus. SEEK MEDICAL CARE IF:   After the first few days, you feel you are getting worse rather than better.  You need your caregiver's advice about medicines to control symptoms.  You develop chills, worsening shortness of breath, or brown or red sputum. These may be signs of pneumonia.  You develop yellow or brown nasal discharge or pain in the face, especially when you bend forward. These may be signs of sinusitis.  You develop a fever, swollen neck glands, pain with swallowing, or white areas in the back of your throat. These may be signs of strep throat. SEEK IMMEDIATE MEDICAL CARE IF:   You have a fever.  You develop severe or persistent headache, ear pain, sinus pain, or chest pain.  You develop wheezing, a prolonged cough, cough up blood, or have a change in your usual mucus (if you have chronic lung disease).  You develop sore muscles or a stiff neck. Document Released: 02/21/2001 Document Revised: 11/20/2011 Document  Reviewed: 12/03/2013 [facility] Patient Information 2015 [place], [place]. This information is not intended to replace advice given to you by your health care provider. Make sure you discuss any questions you have with your health care provider.  Sinus Headache A sinus headache happens when your sinuses become clogged or puffy (swollen). Sinus headaches can be mild or severe. HOME CARE  Take your medicines (antibiotics) as told. Finish them even if you start to feel better.  Only take medicine as told by your doctor.  Use a nose spray if you feel stuffed up (congested). GET HELP RIGHT AWAY IF:  You have a fever.  You have trouble seeing.  You suddenly have pain in your face or head.  You start to twitch or shake (seizure).  You are confused.  You get headaches more than once a week.  Light or sound bothers you.  You feel sick to your stomach (nauseous) or throw up (vomit).  Your headaches do not get better with treatment. MAKE SURE YOU:  Understand these instructions.  Will watch your condition.  Will get help right away if you are not doing well or get worse. Document Released: 12/28/2010 Document Revised: 11/20/2011 Document Reviewed: 12/28/2010 [facility] Patient Information 2015 [place], [place]. This information is not intended to replace advice given to you by your health care provider. Make sure you discuss any questions you have with your health care provider.

## 2015-02-10 NOTE — ED Notes (Signed)
Pt c/o coughing up green sputum, runny nose and headache. States that his grand-daughter was sick with something similar.

## 2015-11-30 ENCOUNTER — Encounter (HOSPITAL_COMMUNITY): Payer: Self-pay | Admitting: Emergency Medicine

## 2015-11-30 ENCOUNTER — Emergency Department (INDEPENDENT_AMBULATORY_CARE_PROVIDER_SITE_OTHER)
Admission: EM | Admit: 2015-11-30 | Discharge: 2015-11-30 | Disposition: A | Discharge status: Home / Self Care | Payer: Self-pay | Source: Home / Self Care | Attending: Family Medicine | Admitting: Family Medicine

## 2015-11-30 DIAGNOSIS — M25422 Effusion, left elbow: Secondary | ICD-10-CM

## 2015-11-30 MED ORDER — CEPHALEXIN 500 MG PO CAPS: 500.0000 mg | ORAL_CAPSULE | Freq: Four times a day (QID) | ORAL | Status: DC

## 2015-11-30 MED ORDER — NAPROXEN 500 MG PO TABS: 500.0000 mg | ORAL_TABLET | Freq: Two times a day (BID) | ORAL | Status: DC

## 2015-11-30 NOTE — ED Notes (Signed)
The patient presented to the [facility] with a complaint of pai and swelling in his left elbow that started last night.

## 2015-11-30 NOTE — Discharge Instructions (Signed)
Heat Therapy °Heat therapy can help ease sore, stiff, injured, and tight muscles and joints. Heat relaxes your muscles, which may help ease your pain. Heat therapy should only be used on old, pre-existing, or long-lasting (chronic) injuries. Do not use heat therapy unless told by your doctor. °HOW TO USE HEAT THERAPY °There are several different kinds of heat therapy, including: °· Moist heat pack. °· Warm water bath. °· Hot water bottle. °· Electric heating pad. °· Heated gel pack. °· Heated wrap. °· Electric heating pad. °GENERAL HEAT THERAPY RECOMMENDATIONS  °· Do not sleep while using heat therapy. Only use heat therapy while you are awake. °· Your skin may turn pink while using heat therapy. Do not use heat therapy if your skin turns red. °· Do not use heat therapy if you have new pain. °· High heat or long exposure to heat can cause burns. Be careful when using heat therapy to avoid burning your skin. °· Do not use heat therapy on areas of your skin that are already irritated, such as with a rash or sunburn. °GET HELP IF:  °· You have blisters, redness, swelling (puffiness), or numbness. °· You have new pain. °· Your pain is worse. °MAKE SURE YOU: °· Understand these instructions. °· Will watch your condition. °· Will get help right away if you are not doing well or get worse. °  °This information is not intended to replace advice given to you by your health care provider. Make sure you discuss any questions you have with your health care provider. °  °Document Released: 11/20/2011 Document Revised: 09/18/2014 Document Reviewed: 10/21/2013 °Elsevier Interactive Patient Education ©2016 [employer] ° °

## 2015-11-30 NOTE — ED Provider Notes (Signed)
CSN: [id number]     Arrival date & time 11/30/15  1854 History   First MD Initiated Contact with Patient 11/30/15 2054     Chief Complaint  Patient presents with  . Joint Swelling   (Consider location/radiation/quality/duration/timing/severity/associated sxs/prior Treatment) HPI History obtained from patient:   LOCATION:left elbow SEVERITY: 4 DURATION: 2 days CONTEXT: Sudden onset no known injury QUALITY: MODIFYING FACTORS: Heat on and fell ASSOCIATED SYMPTOMS: Pain with flexion of elbow TIMING: Constant OCCUPATION: Construction  Past Medical History  Diagnosis Date  . Knee pain   . Asthma    Past Surgical History  Procedure Laterality Date  . Knee arthroscopy      x 2   History reviewed. No pertinent family history. Social History  Substance Use Topics  . Smoking status: Former Research scientist (life sciences)  . Smokeless tobacco: None  . Alcohol Use: No    Review of Systems Left elbow swelling Allergies  Review of patient's allergies indicates no known allergies.  Home Medications   Prior to Admission medications   Medication Sig Start Date End Date Taking? Authorizing Provider  albuterol (PROVENTIL HFA;VENTOLIN HFA) 108 (90 BASE) MCG/ACT inhaler Inhale 2 puffs into the lungs every 2 (two) hours as needed for wheezing or shortness of breath (cough). 02/10/15   Mercedes Camprubi-Soms, PA-C  benzonatate (TESSALON) 100 MG capsule Take 1 capsule (100 mg total) by mouth every 8 (eight) hours. 06/10/14   April Palumbo, MD  cephALEXin (KEFLEX) 500 MG capsule Take 1 capsule (500 mg total) by mouth 4 (four) times daily. 11/30/15   Konrad Felix, PA  DM-Phenylephrine-Acetaminophen (TYLENOL COLD MULTI-SYMPTOM) 10-5-325 MG/15ML LIQD Take 15 mLs by mouth every 4 (four) hours as needed (cold symptoms).    Historical Provider, MD  fluticasone (FLONASE) 50 MCG/ACT nasal spray Place 2 sprays into both nostrils daily. 06/10/14   April Palumbo, MD  HYDROcodone-homatropine [facility]) 5-1.5 MG/5ML syrup Take 5  mLs by mouth every 6 (six) hours as needed for cough. 10/14/14   Montine Circle, PA-C  naproxen (NAPROSYN) 375 MG tablet Take 1 tablet (375 mg total) by mouth 2 (two) times daily. 06/10/14   April Palumbo, MD  naproxen (NAPROSYN) 500 MG tablet Take 1 tablet (500 mg total) by mouth 2 (two) times daily as needed for mild pain, moderate pain or headache (TAKE WITH MEALS.). 02/10/15   Mercedes Camprubi-Soms, PA-C  naproxen (NAPROSYN) 500 MG tablet Take 1 tablet (500 mg total) by mouth 2 (two) times daily. 11/30/15   Konrad Felix, PA  predniSONE (DELTASONE) 20 MG tablet 3 tabs po daily x 4 days 02/10/15   Mercedes Camprubi-Soms, PA-C   Meds Ordered and Administered this Visit  Medications - No data to display  BP 105/92 mmHg  Pulse 76  Temp(Src) 98.2 F (36.8 C) (Oral)  Resp 18  SpO2 99% No data found.   Physical Exam  Constitutional: He is oriented to person, place, and time. He appears well-developed and well-nourished.  HENT:  Head: Normocephalic and atraumatic.  Musculoskeletal: He exhibits tenderness.       Left elbow: He exhibits swelling. Tenderness found.  No foreign body. There is swelling and tenderness noted. But is not over the bursa. No injury noted. Motor and sensory are intact.  Neurological: He is alert and oriented to person, place, and time.  Skin: Skin is warm and dry.  Psychiatric: He has a normal mood and affect. His behavior is normal.  Nursing note and vitals reviewed.   ED Course  Procedures (including critical  care time)  Labs Review Labs Reviewed - No data to display  Imaging Review No results found.   Visual Acuity Review  Right Eye Distance:   Left Eye Distance:   Bilateral Distance:    Right Eye Near:   Left Eye Near:    Bilateral Near:        Prescription for Naprosyn and Keflex Reasons to return to clinic or given. Otherwise follow-up with primary care provider MDM   1. Elbow swelling, left     Patient is reassured that there are no  issues that require transfer to higher level of care at this time or additional tests. Patient is advised to continue home symptomatic treatment. Patient is advised that if there are new or worsening symptoms to attend the emergency department, contact primary care provider, or return to UC. Instructions of care provided discharged home in stable condition. Return to work/school note provided.   THIS NOTE WAS GENERATED USING A VOICE RECOGNITION SOFTWARE PROGRAM. ALL REASONABLE EFFORTS  WERE MADE TO PROOFREAD THIS DOCUMENT FOR ACCURACY.  I have verbally reviewed the discharge instructions with the patient. A printed AVS was given to the patient.  All questions were answered prior to discharge.      Konrad Felix, PA 11/30/15 2146

## 2016-02-17 ENCOUNTER — Telehealth: Payer: Self-pay | Admitting: Surgery

## 2016-02-17 ENCOUNTER — Emergency Department (HOSPITAL_COMMUNITY)
Admission: EM | Admit: 2016-02-17 | Discharge: 2016-02-17 | Disposition: A | Discharge status: Home / Self Care | Payer: Self-pay | Attending: Emergency Medicine | Admitting: Emergency Medicine

## 2016-02-17 ENCOUNTER — Encounter (HOSPITAL_COMMUNITY): Payer: Self-pay

## 2016-02-17 DIAGNOSIS — M79601 Pain in right arm: Secondary | ICD-10-CM | POA: Insufficient documentation

## 2016-02-17 DIAGNOSIS — J45909 Unspecified asthma, uncomplicated: Secondary | ICD-10-CM | POA: Insufficient documentation

## 2016-02-17 DIAGNOSIS — Z79899 Other long term (current) drug therapy: Secondary | ICD-10-CM | POA: Insufficient documentation

## 2016-02-17 DIAGNOSIS — Z791 Long term (current) use of non-steroidal anti-inflammatories (NSAID): Secondary | ICD-10-CM | POA: Insufficient documentation

## 2016-02-17 DIAGNOSIS — Z87891 Personal history of nicotine dependence: Secondary | ICD-10-CM | POA: Insufficient documentation

## 2016-02-17 MED ORDER — NAPROXEN 500 MG PO TABS: 500.0000 mg | ORAL_TABLET | Freq: Two times a day (BID) | ORAL | Status: DC

## 2016-02-17 MED ORDER — HYDROCODONE-ACETAMINOPHEN 5-325 MG PO TABS: 1.0000 | ORAL_TABLET | ORAL | Status: DC | PRN

## 2016-02-17 MED ORDER — HYDROCODONE-ACETAMINOPHEN 5-325 MG PO TABS: 1.0000 | ORAL_TABLET | Freq: Four times a day (QID) | ORAL | Status: DC | PRN

## 2016-02-17 NOTE — ED Provider Notes (Signed)
CSN: [id number]     Arrival date & time 02/17/16  1702 History  By signing my name below, I, Rayna Sexton, attest that this documentation has been prepared under the direction and in the presence of Etta Quill, NP. Electronically Signed: Rayna Sexton, ED Scribe. 02/17/2016. 5:41 PM.    Chief Complaint  Patient presents with  . Arm Pain   Patient is a 55 y.o. male presenting with arm pain. The history is provided by the patient. No language interpreter was used.  Arm Pain This is a new problem. The current episode started more than 1 week ago. The problem occurs constantly. The problem has not changed since onset.Pertinent negatives include no chest pain, no abdominal pain and no shortness of breath. The symptoms are aggravated by bending and twisting. Nothing relieves the symptoms.    HPI Comments: Zachary [name] is a 55 y.o. male who presents to the Emergency Department complaining of constant, moderate, radiating, hand pain x 2 weeks. He states his pain radiates up his right arm to his right shoulder. He reports associated paraesthesia, mild numbness in the digits of his right hand and difficulty sleeping due to pain. His pain worsens with RUE movement. He denies any recent heavy lifting or trauma. He denies a PMHx of neck injury or DM. He requests a resource guide for a PCP due to not having insurance coverage. Pt denies CP, SOB, bloody stools, neck pain, tarry stools, n/v or abd pain.   PCP: None   Past Medical History  Diagnosis Date  . Knee pain   . Asthma    Past Surgical History  Procedure Laterality Date  . Knee arthroscopy      x 2   No family history on file. Social History  Substance Use Topics  . Smoking status: Former Research scientist (life sciences)  . Smokeless tobacco: None  . Alcohol Use: No    Review of Systems  Respiratory: Negative for shortness of breath.   Cardiovascular: Negative for chest pain.  Gastrointestinal: Negative for nausea, vomiting, abdominal pain, blood in  stool and anal bleeding.  Musculoskeletal: Positive for myalgias. Negative for neck pain.  Skin: Negative for wound.  Neurological: Positive for numbness.  All other systems reviewed and are negative.   Allergies  Review of patient's allergies indicates no known allergies.  Home Medications   Prior to Admission medications   Medication Sig Start Date End Date Taking? Authorizing Provider  albuterol (PROVENTIL HFA;VENTOLIN HFA) 108 (90 BASE) MCG/ACT inhaler Inhale 2 puffs into the lungs every 2 (two) hours as needed for wheezing or shortness of breath (cough). 02/10/15   Mercedes Camprubi-Soms, PA-C  benzonatate (TESSALON) 100 MG capsule Take 1 capsule (100 mg total) by mouth every 8 (eight) hours. 06/10/14   April Palumbo, MD  cephALEXin (KEFLEX) 500 MG capsule Take 1 capsule (500 mg total) by mouth 4 (four) times daily. 11/30/15   Konrad Felix, PA  DM-Phenylephrine-Acetaminophen (TYLENOL COLD MULTI-SYMPTOM) 10-5-325 MG/15ML LIQD Take 15 mLs by mouth every 4 (four) hours as needed (cold symptoms).    Historical Provider, MD  fluticasone (FLONASE) 50 MCG/ACT nasal spray Place 2 sprays into both nostrils daily. 06/10/14   April Palumbo, MD  HYDROcodone-homatropine [facility]) 5-1.5 MG/5ML syrup Take 5 mLs by mouth every 6 (six) hours as needed for cough. 10/14/14   Montine Circle, PA-C  naproxen (NAPROSYN) 375 MG tablet Take 1 tablet (375 mg total) by mouth 2 (two) times daily. 06/10/14   April Palumbo, MD  naproxen (NAPROSYN) 500  MG tablet Take 1 tablet (500 mg total) by mouth 2 (two) times daily as needed for mild pain, moderate pain or headache (TAKE WITH MEALS.). 02/10/15   Mercedes Camprubi-Soms, PA-C  naproxen (NAPROSYN) 500 MG tablet Take 1 tablet (500 mg total) by mouth 2 (two) times daily. 11/30/15   Konrad Felix, PA  predniSONE (DELTASONE) 20 MG tablet 3 tabs po daily x 4 days 02/10/15   Mercedes Camprubi-Soms, PA-C   BP 106/80 mmHg  Pulse 81  Temp(Src) 97.9 F (36.6 C) (Oral)  Resp 16   SpO2 99%    Physical Exam  Constitutional: He is oriented to person, place, and time. He appears well-developed and well-nourished.  HENT:  Head: Normocephalic and atraumatic.  Eyes: EOM are normal.  Neck: Normal range of motion.  Cardiovascular: Normal rate, regular rhythm and normal heart sounds.  Exam reveals no gallop and no friction rub.   No murmur heard. Pulmonary/Chest: Effort normal and breath sounds normal. No respiratory distress. He has no wheezes. He has no rales.  Abdominal: Soft. There is no tenderness.  Musculoskeletal: Normal range of motion. He exhibits no tenderness.  FROM of right hand and wrist without pain  Neurological: He is alert and oriented to person, place, and time.  Strength 5/5 of the right hand; sensation decreased in right hand when compared to the left  Skin: Skin is warm and dry.  Psychiatric: He has a normal mood and affect.  Nursing note and vitals reviewed.   ED Course  Procedures  DIAGNOSTIC STUDIES: Oxygen Saturation is 99% on RA, normal by my interpretation.    COORDINATION OF CARE: 5:35 PM Discussed next steps with pt. Pt verbalized understanding and is agreeable with the plan.   5:43 PM Discussed pt with case manager who will call him at home regarding [place]. Pt made aware of next steps and will provide a resource guide. He is agreeable with the plan.   Labs Review Labs Reviewed - No data to display  Imaging Review No results found.   EKG Interpretation None      MDM   Final diagnoses:  None  Patient with arm pain and intermittent numbness likely radicular in origin. Conservative therapy recommended and discussed.  Pt advised he will likely need to follow up with orthopedics/neurology if no improvement with conservative treatment. Case management will assist with helping getting patient placed with health and wellness. Patient will be discharged home & is agreeable with above plan. Returns precautions  discussed. Pt appears safe for discharge.  I personally performed the services described in this documentation, which was scribed in my presence. The recorded information has been reviewed and is accurate.     Etta Quill, NP 02/17/16 Dionicia Abler  Orlie Dakin, MD 02/18/16 [id number]

## 2016-02-17 NOTE — Discharge Instructions (Signed)
Musculoskeletal Pain Musculoskeletal pain is muscle and boney aches and pains. These pains can occur in any part of the body. Your caregiver may treat you without knowing the cause of the pain.  CAUSES There is often not a definite cause or reason for these pains. These pains may be caused by a type of germ (virus). The discomfort may also come from overuse. Overuse includes working out too hard when your body is not fit. Boney aches also come from weather changes. Bone is sensitive to atmospheric pressure changes. Pain may be due to pressure on a nerve root exiting from the spine. HOME CARE INSTRUCTIONS   Ask when your test results will be ready. Make sure you get your test results.  Only take over-the-counter or prescription medicines for pain, discomfort, or fever as directed by your caregiver. If you were given medications for your condition, do not drive, operate machinery or power tools, or sign legal documents for 24 hours. Do not drink alcohol. Do not take sleeping pills or other medications that may interfere with treatment.  Continue all activities unless the activities cause more pain. When the pain lessens, slowly resume normal activities. Gradually increase the intensity and duration of the activities or exercise.  During periods of severe pain, bed rest may be helpful. Lay or sit in any position that is comfortable.  Putting ice on the injured area.  Put ice in a bag.  Place a towel between your skin and the bag.  Leave the ice on for 15 to 20 minutes, 3 to 4 times a day.  Follow up with your caregiver for continued problems and no reason can be found for the pain. If the pain becomes worse or does not go away, it may be necessary to do additional testing. Your caregiver may need to look further for a possible cause. SEEK IMMEDIATE MEDICAL CARE IF:  You have pain that is getting worse and is not relieved by medications.  You develop chest pain that is associated with  shortness or breath, sweating, feeling sick to your stomach (nauseous), or throw up (vomit).  Your pain becomes localized to the abdomen.  You develop any new symptoms that seem different or that concern you. MAKE SURE YOU:   Understand these instructions.  Will watch your condition.  Will get help right away if you are not doing well or get worse.   This information is not intended to replace advice given to you by your health care provider. Make sure you discuss any questions you have with your health care provider.   Document Released: 08/28/2005 Document Revised: 11/20/2011 Document Reviewed: 05/02/2013 Elsevier Interactive Patient Education 2016 [place] case manager will contact you at your home number to help you with follow-up care.  Marland Kitchen

## 2016-02-17 NOTE — ED Notes (Signed)
Pt reports right arm pain and tingling to fingertips, onset 2-3 weeks ago. He denies heavy lifting or injury. He decided to come to ER tonight because the pain is not getting any better. +radial pulse. Ambulatory with steady gait, speaking clear complete sentences, no neuro symptoms.

## 2016-02-17 NOTE — Telephone Encounter (Signed)
ED CM attempted to contact patient at verified number in record to assist with establishing care at the [facility], no answer voicemail left.

## 2016-07-25 ENCOUNTER — Emergency Department (HOSPITAL_COMMUNITY)
Admission: EM | Admit: 2016-07-25 | Discharge: 2016-07-25 | Disposition: A | Discharge status: Home / Self Care | Payer: Self-pay | Attending: Emergency Medicine | Admitting: Emergency Medicine

## 2016-07-25 ENCOUNTER — Encounter (HOSPITAL_COMMUNITY): Payer: Self-pay

## 2016-07-25 DIAGNOSIS — Z87891 Personal history of nicotine dependence: Secondary | ICD-10-CM | POA: Insufficient documentation

## 2016-07-25 DIAGNOSIS — J45909 Unspecified asthma, uncomplicated: Secondary | ICD-10-CM | POA: Insufficient documentation

## 2016-07-25 DIAGNOSIS — J0100 Acute maxillary sinusitis, unspecified: Secondary | ICD-10-CM | POA: Insufficient documentation

## 2016-07-25 MED ORDER — OXYMETAZOLINE HCL 0.05 % NA SOLN: 1.0000 | Freq: Two times a day (BID) | NASAL | 0 refills | Status: DC

## 2016-07-25 NOTE — ED Triage Notes (Signed)
Onset yesterday sinus congestion and left ear pain.  No ear drainage or fever.

## 2016-07-25 NOTE — Discharge Instructions (Signed)
Apply your nasal spray to both nostrils twice daily. Do not use nasal spray for more than 3 days to prevent rebound rhinorrhea. I also recommend taking Tylenol and ibuprofen as prescribed over-the-counter, alternating between doses every 3-4 hours as needed for headache/pain relief. Continue to drink fluids at home to remain hydrated. He may also use warm saline sinus rinse to help with your symptoms. Please follow up with a primary care provider from the Resource Guide provided below in one week if your symptoms are not improved Please return to the Emergency Department if symptoms worsen or new onset of fever, headache, and facial swelling, difficulty breathing, neck stiffness, vomiting, unable to keep fluids down.

## 2016-07-25 NOTE — ED Provider Notes (Signed)
[facility] DEPT Provider Note   CSN: [id number] Arrival date & time: 07/25/16  1749  By signing my name below, I, Reola Mosher, attest that this documentation has been prepared under the direction and in the presence of Harlene Ramus, PA-C.  Electronically Signed: Reola Mosher, ED Scribe. 07/25/16. 6:48 PM.  History   Chief Complaint Chief Complaint  Patient presents with  . Otalgia  . URI   The history is provided by the patient. No language interpreter was used.    HPI Comments: Zachary [name] is a 55 y.o. male who presents to the Emergency Department complaining of persistent left-sided sinus pressure/pain onset approximately three days ago, gradually worsening since last night. He reports associated clear rhinorrhea, mild sore throat, frontal headache, and left ear pain w/ sensation of fullness/decreased hearing secondary to his current sinus pain. Pt has been using decongestant medications and Aleve with moderate relief of his headache, but notes that it has since come back. His grandchild is a sick contact with cold-like sxs this week. Denies ear drainage, facial swelling, neck pain, neck stiffness, fever, cough, SOB, or any other associated symptoms.   Past Medical History:  Diagnosis Date  . Asthma   . Knee pain    There are no active problems to display for this patient.  Past Surgical History:  Procedure Laterality Date  . KNEE ARTHROSCOPY     x 2    Home Medications    Prior to Admission medications   Medication Sig Start Date End Date Taking? Authorizing Provider  albuterol (PROVENTIL HFA;VENTOLIN HFA) 108 (90 BASE) MCG/ACT inhaler Inhale 2 puffs into the lungs every 2 (two) hours as needed for wheezing or shortness of breath (cough). 02/10/15  Yes Mercedes Camprubi-Soms, PA-C  Pseudoephedrine-Ibuprofen (IBUPROFEN AND PSE COLD & SINUS PO) Take 1 tablet by mouth daily as needed. For cold symptoms   Yes Historical Provider, MD  benzonatate  (TESSALON) 100 MG capsule Take 1 capsule (100 mg total) by mouth every 8 (eight) hours. Patient not taking: Reported on 07/25/2016 06/10/14   April Palumbo, MD  cephALEXin (KEFLEX) 500 MG capsule Take 1 capsule (500 mg total) by mouth 4 (four) times daily. Patient not taking: Reported on 07/25/2016 11/30/15   Konrad Felix, PA  fluticasone [facility]) 50 MCG/ACT nasal spray Place 2 sprays into both nostrils daily. Patient not taking: Reported on 07/25/2016 06/10/14   April Palumbo, MD  HYDROcodone-acetaminophen (NORCO/VICODIN) 5-325 MG tablet Take 1 tablet by mouth every 6 (six) hours as needed for severe pain. Patient not taking: Reported on 07/25/2016 02/17/16   Etta Quill, NP  HYDROcodone-homatropine [facility]) 5-1.5 MG/5ML syrup Take 5 mLs by mouth every 6 (six) hours as needed for cough. Patient not taking: Reported on 07/25/2016 10/14/14   Montine Circle, PA-C  naproxen (NAPROSYN) 500 MG tablet Take 1 tablet (500 mg total) by mouth 2 (two) times daily. Patient not taking: Reported on 07/25/2016 02/17/16   Etta Quill, NP  oxymetazoline [facility] NASAL SPRAY) 0.05 % nasal spray Place 1 spray into both nostrils 2 (two) times daily. 07/25/16   Nona Dell, PA-C  predniSONE (DELTASONE) 20 MG tablet 3 tabs po daily x 4 days Patient not taking: Reported on 07/25/2016 02/10/15   Mercedes Camprubi-Soms, PA-C   Family History History reviewed. No pertinent family history.  Social History Social History  Substance Use Topics  . Smoking status: Former Research scientist (life sciences)  . Smokeless tobacco: Never Used  . Alcohol use No   Allergies  Patient has no known allergies.  Review of Systems Review of Systems  Constitutional: Negative for fever.  HENT: Positive for ear pain, rhinorrhea, sinus pain, sinus pressure and sore throat. Negative for ear discharge and facial swelling.   Respiratory: Negative for cough and shortness of breath.   Musculoskeletal: Negative for neck pain and neck stiffness.    Neurological: Positive for headaches.   Physical Exam Updated Vital Signs BP 136/91   Pulse 82   Temp 98.3 F (36.8 C) (Oral)   Resp 18   Ht 5\' 6"  (1.676 m)   Wt 145 lb (65.8 kg)   SpO2 97%   BMI 23.40 kg/m   Physical Exam  Constitutional: He is oriented to person, place, and time. He appears well-developed and well-nourished.  HENT:  Head: Normocephalic and atraumatic.  Right Ear: Tympanic membrane normal.  Left Ear: Tympanic membrane normal.  Nose: Rhinorrhea present. Right sinus exhibits no maxillary sinus tenderness and no frontal sinus tenderness. Left sinus exhibits no maxillary sinus tenderness and no frontal sinus tenderness.  Mouth/Throat: Uvula is midline, oropharynx is clear and moist and mucous membranes are normal. No trismus in the jaw. No uvula swelling. No oropharyngeal exudate, posterior oropharyngeal edema, posterior oropharyngeal erythema or tonsillar abscesses. No tonsillar exudate.  Eyes: Conjunctivae and EOM are normal. Pupils are equal, round, and reactive to light. Right eye exhibits no discharge. Left eye exhibits no discharge. No scleral icterus.  Neck: Normal range of motion. Neck supple.  Cardiovascular: Normal rate and intact distal pulses.   Pulmonary/Chest: Effort normal.  Abdominal: Soft.  Musculoskeletal: He exhibits no edema.  Lymphadenopathy:    He has no cervical adenopathy.  Neurological: He is alert and oriented to person, place, and time.  Skin: Skin is warm and dry.  Nursing note and vitals reviewed.  ED Treatments / Results  DIAGNOSTIC STUDIES: Oxygen Saturation is 97% on RA, normal by my interpretation.   COORDINATION OF CARE: 6:47 PM-Discussed next steps with pt. Pt verbalized understanding and is agreeable with the plan.   Labs (all labs ordered are listed, but only abnormal results are displayed) Labs Reviewed - No data to display  EKG  EKG Interpretation None      Radiology No results  found.  Procedures Procedures   Medications Ordered in ED Medications - No data to display  Initial Impression / Assessment and Plan / ED Course  I have reviewed the triage vital signs and the nursing notes.  Pertinent labs & imaging results that were available during my care of the patient were reviewed by me and considered in my medical decision making (see chart for details).  Clinical Course    Patient complaining of symptoms of sinusitis.    Mild to moderate symptoms of clear/yellow nasal discharge/congestion and scratchy throat but w/o cough for less than 10 days. Patient is afebrile. No concern for acute bacterial rhinosinusitis; likely viral in nature. Patient discharged with symptomatic treatment. Patient instructions given for warm saline nasal washes and will rx Afrin spray to use at home. Pt is comfortable with above plan and is stable for discharge at this time. All questions were answered prior to disposition. Strict return precautions for return into the ED were discussed.   Final Clinical Impressions(s) / ED Diagnoses   Final diagnoses:  Acute non-recurrent maxillary sinusitis   New Prescriptions New Prescriptions   OXYMETAZOLINE (AFRIN NASAL SPRAY) 0.05 % NASAL SPRAY    Place 1 spray into both nostrils 2 (two) times daily.  I personally performed the services described in this documentation, which was scribed in my presence. The recorded information has been reviewed and is accurate.     Chesley Noon [place], [place] 07/25/16 1854    Isla Pence, MD 07/25/16 [id number]

## 2017-01-05 ENCOUNTER — Emergency Department (HOSPITAL_COMMUNITY)
Admission: EM | Admit: 2017-01-05 | Discharge: 2017-01-05 | Disposition: A | Discharge status: Home / Self Care | Payer: Self-pay | Attending: Emergency Medicine | Admitting: Emergency Medicine

## 2017-01-05 ENCOUNTER — Encounter (HOSPITAL_COMMUNITY): Payer: Self-pay | Admitting: Emergency Medicine

## 2017-01-05 DIAGNOSIS — Y929 Unspecified place or not applicable: Secondary | ICD-10-CM | POA: Insufficient documentation

## 2017-01-05 DIAGNOSIS — S01111A Laceration without foreign body of right eyelid and periocular area, initial encounter: Secondary | ICD-10-CM | POA: Insufficient documentation

## 2017-01-05 DIAGNOSIS — Z79899 Other long term (current) drug therapy: Secondary | ICD-10-CM | POA: Insufficient documentation

## 2017-01-05 DIAGNOSIS — S0181XA Laceration without foreign body of other part of head, initial encounter: Secondary | ICD-10-CM

## 2017-01-05 DIAGNOSIS — J45909 Unspecified asthma, uncomplicated: Secondary | ICD-10-CM | POA: Insufficient documentation

## 2017-01-05 DIAGNOSIS — Z87891 Personal history of nicotine dependence: Secondary | ICD-10-CM | POA: Insufficient documentation

## 2017-01-05 DIAGNOSIS — Y999 Unspecified external cause status: Secondary | ICD-10-CM | POA: Insufficient documentation

## 2017-01-05 DIAGNOSIS — Y939 Activity, unspecified: Secondary | ICD-10-CM | POA: Insufficient documentation

## 2017-01-05 DIAGNOSIS — W228XXA Striking against or struck by other objects, initial encounter: Secondary | ICD-10-CM | POA: Insufficient documentation

## 2017-01-05 MED ORDER — IBUPROFEN 800 MG PO TABS
800.0000 mg | ORAL_TABLET | Freq: Once | ORAL | Status: AC
  Administered 2017-01-05: 800 mg via ORAL
  Filled 2017-01-05: qty 1

## 2017-01-05 MED ORDER — LIDOCAINE HCL (PF) 1 % IJ SOLN
5.0000 mL | Freq: Once | INTRAMUSCULAR | Status: AC
  Administered 2017-01-05: 5 mL
  Filled 2017-01-05: qty 5

## 2017-01-05 MED ORDER — LIDOCAINE-EPINEPHRINE-TETRACAINE (LET) SOLUTION
3.0000 mL | Freq: Once | NASAL | Status: AC
  Administered 2017-01-05: 20:00:00 3 mL via TOPICAL
  Filled 2017-01-05: qty 3

## 2017-01-05 NOTE — ED Provider Notes (Signed)
[facility] DEPT Provider Note   CSN: [id number] Arrival date & time: 01/05/17  1738  By signing my name below, I, Sherilynn Knight and Hansel Feinstein, attest that this documentation has been prepared under the direction and in the presence of Debroah Baller, NP Electronically Signed: Marcello Moores and Hansel Feinstein, ED Scribe. 01/05/17. 6:55 PM.   History   Chief Complaint Chief Complaint  Patient presents with  . Facial Laceration     The history is provided by the patient. No language interpreter was used.  Laceration   The incident occurred 6 to 12 hours ago. The laceration is located on the right eye. The laceration mechanism was a a nail. The pain is moderate. The pain has been improving since onset. He reports no foreign bodies present. His tetanus status is UTD.    HPI Comments: Zachary [name] is a 56 y.o. male who presents to the Emergency Department complaining of a moderately painful laceration right over his right eye that occurred today. Pt was working in his attic when a drill bit "jumped back" and struck him over the right eye. He denies LOC. The pt initially bandaged it himself and then went to the ED. He attests that his tetanus shots are UTD. Pt denies blurriness, dizziness, ear pain, epistaxis or bleeding from the eyes or ears. Patient reports that he does not want x-rays he just couldn't get the bleeding stopped.   Past Medical History:  Diagnosis Date  . Asthma   . Knee pain     There are no active problems to display for this patient.   Past Surgical History:  Procedure Laterality Date  . KNEE ARTHROSCOPY     x 2       Home Medications    Prior to Admission medications   Medication Sig Start Date End Date Taking? Authorizing Provider  albuterol (PROVENTIL HFA;VENTOLIN HFA) 108 (90 BASE) MCG/ACT inhaler Inhale 2 puffs into the lungs every 2 (two) hours as needed for wheezing or shortness of breath (cough). 02/10/15   Mercedes Street, PA-C    benzonatate (TESSALON) 100 MG capsule Take 1 capsule (100 mg total) by mouth every 8 (eight) hours. Patient not taking: Reported on 07/25/2016 06/10/14   April Palumbo, MD  cephALEXin (KEFLEX) 500 MG capsule Take 1 capsule (500 mg total) by mouth 4 (four) times daily. Patient not taking: Reported on 07/25/2016 11/30/15   Konrad Felix, PA  fluticasone [facility]) 50 MCG/ACT nasal spray Place 2 sprays into both nostrils daily. Patient not taking: Reported on 07/25/2016 06/10/14   April Palumbo, MD  HYDROcodone-acetaminophen (NORCO/VICODIN) 5-325 MG tablet Take 1 tablet by mouth every 6 (six) hours as needed for severe pain. Patient not taking: Reported on 07/25/2016 02/17/16   Etta Quill, NP  HYDROcodone-homatropine [facility]) 5-1.5 MG/5ML syrup Take 5 mLs by mouth every 6 (six) hours as needed for cough. Patient not taking: Reported on 07/25/2016 10/14/14   Montine Circle, PA-C  naproxen (NAPROSYN) 500 MG tablet Take 1 tablet (500 mg total) by mouth 2 (two) times daily. Patient not taking: Reported on 07/25/2016 02/17/16   Etta Quill, NP  oxymetazoline [facility] NASAL SPRAY) 0.05 % nasal spray Place 1 spray into both nostrils 2 (two) times daily. 07/25/16   Nona Dell, PA-C  predniSONE (DELTASONE) 20 MG tablet 3 tabs po daily x 4 days Patient not taking: Reported on 07/25/2016 02/10/15   Mercedes Street, PA-C  Pseudoephedrine-Ibuprofen (IBUPROFEN AND PSE COLD & SINUS PO) Take 1 tablet by mouth  daily as needed. For cold symptoms    Historical Provider, MD    Family History No family history on file.  Social History Social History  Substance Use Topics  . Smoking status: Former Research scientist (life sciences)  . Smokeless tobacco: Never Used  . Alcohol use No     Allergies   Patient has no known allergies.   Review of Systems Review of Systems  HENT: Negative for ear pain and nosebleeds.   Eyes: Negative for visual disturbance.  Gastrointestinal: Negative for nausea and vomiting.  Skin: Positive for  wound.  Neurological: Positive for headaches (at the area of the wound). Negative for dizziness, syncope and light-headedness.  Psychiatric/Behavioral: Negative for confusion.     Physical Exam Updated Vital Signs BP 125/89   Pulse 87   Temp 98.7 F (37.1 C) (Oral)   Resp 16   Ht 5\' 6"  (1.676 m)   Wt 68 kg   SpO2 96%   BMI 24.21 kg/m   Physical Exam  Constitutional: He appears well-developed and well-nourished. No distress.  HENT:  Head: Head is with laceration.    Right Ear: Tympanic membrane normal.  Left Ear: Tympanic membrane normal.  Nose: No epistaxis.  V shaped laceration above the right eye.  Eyes: Conjunctivae and EOM are normal. Pupils are equal, round, and reactive to light.  Neck: Normal range of motion. Neck supple.  Cardiovascular: Normal rate.   No murmur heard. Pulmonary/Chest: Effort normal.  Abdominal: Soft. There is no tenderness.  Musculoskeletal: Normal range of motion. He exhibits no edema.  Neurological: He is alert.  Skin: Skin is warm and dry.  Psychiatric: He has a normal mood and affect.  Nursing note and vitals reviewed.    ED Treatments / Results  DIAGNOSTIC STUDIES: Oxygen Saturation is 96% on RA, adequate by my interpretation.   COORDINATION OF CARE: 7:00 PM-Discussed next steps with pt which includes wound care. Pt verbalized understanding and is agreeable with the plan.    Radiology No results found.  Procedures .Marland KitchenLaceration Repair Date/Time: 01/05/2017 7:32 PM Performed by: Ashley Murrain Authorized by: Ashley Murrain   Consent:    Consent obtained:  Verbal   Consent given by:  Patient   Risks discussed:  Infection, pain, poor cosmetic result and poor wound healing   Alternatives discussed:  No treatment Universal protocol:    Procedure explained and questions answered to patient or proxy's satisfaction: yes     Immediately prior to procedure, a time out was called: yes     Patient identity confirmed:  Verbally with  patient Anesthesia (see MAR for exact dosages):    Anesthesia method:  None Laceration details:    Location:  Face   Face location:  R upper eyelid   Extent:  Superficial Repair type:    Repair type:  Simple Pre-procedure details:    Preparation:  Patient was prepped and draped in usual sterile fashion Exploration:    Wound exploration: wound explored through full range of motion and entire depth of wound probed and visualized     Contaminated: no   Treatment:    Area cleansed with:  Shur-Clens and saline   Amount of cleaning:  Standard   Irrigation solution:  Sterile saline Skin repair:    Repair method:  Tissue adhesive Approximation:    Approximation:  Close Post-procedure details:    Dressing:  Antibiotic ointment and sterile dressing   Patient tolerance of procedure:  Tolerated well, no immediate complications .Marland KitchenLaceration Repair Date/Time: 01/05/2017 7:34  PM Performed by: Ashley Murrain Authorized by: Ashley Murrain   Consent:    Consent obtained:  Verbal   Consent given by:  Patient   Risks discussed:  Infection and pain   Alternatives discussed:  No treatment Universal protocol:    Procedure explained and questions answered to patient or proxy's satisfaction: yes     Patient identity confirmed:  Verbally with patient Anesthesia (see MAR for exact dosages):    Anesthesia method:  Topical application and local infiltration   Topical anesthetic:  LET   Local anesthetic:  Lidocaine 1% w/o epi Laceration details:    Location:  Face   Face location:  R eyebrow   Length (cm):  2 Repair type:    Repair type:  Simple Pre-procedure details:    Preparation:  Patient was prepped and draped in usual sterile fashion Exploration:    Wound exploration: entire depth of wound probed and visualized     Contaminated: no   Treatment:    Area cleansed with:  Saline and Shur-Clens   Amount of cleaning:  Standard   Irrigation solution:  Sterile saline   Irrigation method:   Syringe Skin repair:    Repair method:  Sutures   Suture size:  6-0   Suture material:  Prolene   Number of sutures:  3 Approximation:    Approximation:  Close Post-procedure details:    Dressing:  Antibiotic ointment and sterile dressing   Patient tolerance of procedure:  Tolerated well, no immediate complications   (including critical care time)  Medications Ordered in ED Medications  lidocaine (PF) (XYLOCAINE) 1 % injection 5 mL (not administered)  lidocaine-EPINEPHrine-tetracaine (LET) solution (3 mLs Topical Given 01/05/17 2005)  ibuprofen (ADVIL,MOTRIN) tablet 800 mg (800 mg Oral Given 01/05/17 2050)     Initial Impression / Assessment and Plan / ED Course  I have reviewed the triage vital signs and the nursing notes.  Tetanus UTD. Laceration occurred < 12 hours prior to repair. Discussed laceration care with pt and answered questions. Pt to f-u for suture removal in 3-5 days and wound check sooner should there be signs of dehiscence or infection. Pt is hemodynamically stable with no complaints prior to dc.     Final Clinical Impressions(s) / ED Diagnoses   Final diagnoses:  Facial laceration, initial encounter    New Prescriptions New Prescriptions   No medications on file  I personally performed the services described in this documentation, which was scribed in my presence. The recorded information has been reviewed and is accurate.     [place], [place] 01/05/17 2114    Daleen Bo, MD 01/06/17 [phone]

## 2017-01-05 NOTE — ED Notes (Signed)
Suture cart set up at bedside  

## 2017-01-05 NOTE — ED Triage Notes (Signed)
Pt reports he was working in his attic when a drill bit hit him above the right eye, lac above eye, bleeding controlled with pressure. Pt denies any vision issues. Reports he is UTD on tetanus

## 2017-11-10 ENCOUNTER — Encounter (HOSPITAL_COMMUNITY): Payer: Self-pay | Admitting: Nurse Practitioner

## 2017-11-10 DIAGNOSIS — J111 Influenza due to unidentified influenza virus with other respiratory manifestations: Secondary | ICD-10-CM | POA: Insufficient documentation

## 2017-11-10 DIAGNOSIS — J45909 Unspecified asthma, uncomplicated: Secondary | ICD-10-CM | POA: Insufficient documentation

## 2017-11-10 DIAGNOSIS — Z79899 Other long term (current) drug therapy: Secondary | ICD-10-CM | POA: Insufficient documentation

## 2017-11-10 DIAGNOSIS — Z87891 Personal history of nicotine dependence: Secondary | ICD-10-CM | POA: Insufficient documentation

## 2017-11-10 NOTE — ED Triage Notes (Signed)
Pt is c/o facial pain that he is attributing to sinus issues and allergies.

## 2017-11-11 ENCOUNTER — Emergency Department (HOSPITAL_COMMUNITY)
Admission: EM | Admit: 2017-11-11 | Discharge: 2017-11-11 | Disposition: A | Discharge status: Home / Self Care | Payer: Self-pay | Attending: Emergency Medicine | Admitting: Emergency Medicine

## 2017-11-11 ENCOUNTER — Emergency Department (HOSPITAL_COMMUNITY): Payer: Self-pay

## 2017-11-11 ENCOUNTER — Other Ambulatory Visit: Payer: Self-pay

## 2017-11-11 DIAGNOSIS — R69 Illness, unspecified: Secondary | ICD-10-CM

## 2017-11-11 DIAGNOSIS — J111 Influenza due to unidentified influenza virus with other respiratory manifestations: Secondary | ICD-10-CM

## 2017-11-11 MED ORDER — FLUTICASONE PROPIONATE 50 MCG/ACT NA SUSP: 2.0000 | Freq: Every day | NASAL | 0 refills | Status: DC

## 2017-11-11 MED ORDER — PSEUDOEPHEDRINE HCL ER 120 MG PO TB12: 120.0000 mg | ORAL_TABLET | Freq: Two times a day (BID) | ORAL | 0 refills | Status: DC

## 2017-11-11 MED ORDER — PSEUDOEPHEDRINE HCL 60 MG PO TABS
30.0000 mg | ORAL_TABLET | Freq: Once | ORAL | Status: AC
  Administered 2017-11-11: 30 mg via ORAL
  Filled 2017-11-11: qty 1

## 2017-11-11 NOTE — ED Provider Notes (Signed)
[place] DEPT Provider Note   CSN: [id number] Arrival date & time: 11/10/17  2252     History   Chief Complaint Chief Complaint  Patient presents with  . Cough    HPI EWARD RUTIGLIANO is a 57 y.o. male.  HPI Vision presents with concern of cough, tightness, facial discomfort. Onset was about 2 days ago, gradually the patient has become more uncomfortable during the illness. He does occasional cough, chest fullness and tightness, as well as facial fullness and tightness, with some runny nose. He has had transient relief with OTC sinus medication, though he does not continue to take this medication for unclear reasons. No fever, no vomiting, no change in appetite, no true chest pain, no abdominal pain. Patient is a former smoker. Past Medical History:  Diagnosis Date  . Asthma   . Knee pain     There are no active problems to display for this patient.   Past Surgical History:  Procedure Laterality Date  . KNEE ARTHROSCOPY     x 2       Home Medications    Prior to Admission medications   Medication Sig Start Date End Date Taking? Authorizing Provider  albuterol (PROVENTIL HFA;VENTOLIN HFA) 108 (90 BASE) MCG/ACT inhaler Inhale 2 puffs into the lungs every 2 (two) hours as needed for wheezing or shortness of breath (cough). 02/10/15   Street, Mercedes, PA-C  benzonatate (TESSALON) 100 MG capsule Take 1 capsule (100 mg total) by mouth every 8 (eight) hours. Patient not taking: Reported on 07/25/2016 06/10/14   Palumbo, April, MD  cephALEXin (KEFLEX) 500 MG capsule Take 1 capsule (500 mg total) by mouth 4 (four) times daily. Patient not taking: Reported on 07/25/2016 11/30/15   Konrad Felix, PA  fluticasone [facility]) 50 MCG/ACT nasal spray Place 2 sprays into both nostrils daily. Patient not taking: Reported on 07/25/2016 06/10/14   Palumbo, April, MD  HYDROcodone-acetaminophen (NORCO/VICODIN) 5-325 MG tablet Take 1 tablet by mouth  every 6 (six) hours as needed for severe pain. Patient not taking: Reported on 07/25/2016 02/17/16   Etta Quill, NP  HYDROcodone-homatropine [facility]) 5-1.5 MG/5ML syrup Take 5 mLs by mouth every 6 (six) hours as needed for cough. Patient not taking: Reported on 07/25/2016 10/14/14   Montine Circle, PA-C  naproxen (NAPROSYN) 500 MG tablet Take 1 tablet (500 mg total) by mouth 2 (two) times daily. Patient not taking: Reported on 07/25/2016 02/17/16   Etta Quill, NP  oxymetazoline [facility] NASAL SPRAY) 0.05 % nasal spray Place 1 spray into both nostrils 2 (two) times daily. 07/25/16   Nona Dell, PA-C  predniSONE (DELTASONE) 20 MG tablet 3 tabs po daily x 4 days Patient not taking: Reported on 07/25/2016 02/10/15   Street, [place], PA-C  Pseudoephedrine-Ibuprofen (IBUPROFEN AND PSE COLD & SINUS PO) Take 1 tablet by mouth daily as needed. For cold symptoms    [provider]    Family History No family history on file.  Social History Social History   Tobacco Use  . Smoking status: Former Research scientist (life sciences)  . Smokeless tobacco: Never Used  Substance Use Topics  . Alcohol use: No  . Drug use: No     Allergies   Patient has no known allergies.   Review of Systems Review of Systems  Constitutional:       Per HPI, otherwise negative  HENT:       Per HPI, otherwise negative  Respiratory:       Per HPI,  otherwise negative  Cardiovascular:       Per HPI, otherwise negative  Gastrointestinal: Negative for vomiting.  Endocrine:       Negative aside from HPI  Genitourinary:       Neg aside from HPI   Musculoskeletal:       Per HPI, otherwise negative  Skin: Negative.   Neurological: Negative for syncope.     Physical Exam Updated Vital Signs BP 137/87 (BP Location: Left Arm)   Pulse 69   Temp 98.4 F (36.9 C) (Oral)   Resp 16   Ht 5\' 3"  (1.6 m)   Wt 68 kg (150 lb)   SpO2 95%   BMI 26.57 kg/m   Physical Exam  Constitutional: He is oriented to person,  place, and time. He appears well-developed. No distress.  HENT:  Head: Normocephalic and atraumatic.  Eyes: Conjunctivae and EOM are normal.  Cardiovascular: Normal rate and regular rhythm.  Pulmonary/Chest: Effort normal. No stridor. No respiratory distress.  Abdominal: He exhibits no distension.  Musculoskeletal: He exhibits no edema.  Neurological: He is alert and oriented to person, place, and time.  Skin: Skin is warm and dry.  Psychiatric: He has a normal mood and affect.  Nursing note and vitals reviewed.    ED Treatments / Results   Radiology Dg Chest 2 View  Result Date: 11/11/2017 CLINICAL DATA:  Cough and chest tightness. EXAM: CHEST  2 VIEW COMPARISON:  PA and lateral chest 06/10/2014 and 02/10/2015. FINDINGS: The lungs are clear. Heart size is normal. No pneumothorax or pleural effusion. No acute bony abnormality. IMPRESSION: No acute disease. Electronically Signed   By: Inge Rise M.D.   On: 11/11/2017 02:03    Procedures Procedures (including critical care time)  Medications Ordered in ED Medications  pseudoephedrine (SUDAFED) tablet 30 mg (not administered)     Initial Impression / Assessment and Plan / ED Course  I have reviewed the triage vital signs and the nursing notes.  Pertinent labs & imaging results that were available during my care of the patient were reviewed by me and considered in my medical decision making (see chart for details).  2:56 AM On repeat exam the patient is awake and alert, states that he starting to feel better. He asks that I evaluate what he believes is a boil in his pubic hair. On exam, there is no appreciable abscess, and he notes that it must have resolved.  Patient is not hypoxic, has unremarkable vital signs, no evidence for pneumonia, may be having influenza-like illness, but with reassuring findings, the patient was appropriate for discharge with close outpatient follow-up.   Final Clinical Impressions(s) / ED  Diagnoses  Influenza-like illness   Carmin Muskrat, MD 11/11/17 [phone]

## 2017-11-11 NOTE — Discharge Instructions (Signed)
As discussed, your evaluation today has been largely reassuring.  But, it is important that you monitor your condition carefully, and do not hesitate to return to the ED if you develop new, or concerning changes in your condition. ? ?Otherwise, please follow-up with your physician for appropriate ongoing care. ? ?

## 2018-06-11 ENCOUNTER — Emergency Department (HOSPITAL_COMMUNITY)
Admission: EM | Admit: 2018-06-11 | Discharge: 2018-06-12 | Disposition: A | Discharge status: Home / Self Care | Payer: Self-pay | Attending: Emergency Medicine | Admitting: Emergency Medicine

## 2018-06-11 ENCOUNTER — Other Ambulatory Visit: Payer: Self-pay

## 2018-06-11 DIAGNOSIS — Z79899 Other long term (current) drug therapy: Secondary | ICD-10-CM | POA: Insufficient documentation

## 2018-06-11 DIAGNOSIS — Z87891 Personal history of nicotine dependence: Secondary | ICD-10-CM | POA: Insufficient documentation

## 2018-06-11 DIAGNOSIS — J45909 Unspecified asthma, uncomplicated: Secondary | ICD-10-CM | POA: Insufficient documentation

## 2018-06-11 DIAGNOSIS — R079 Chest pain, unspecified: Secondary | ICD-10-CM | POA: Insufficient documentation

## 2018-06-11 DIAGNOSIS — R112 Nausea with vomiting, unspecified: Secondary | ICD-10-CM | POA: Insufficient documentation

## 2018-06-12 ENCOUNTER — Other Ambulatory Visit: Payer: Self-pay

## 2018-06-12 ENCOUNTER — Encounter (HOSPITAL_COMMUNITY): Payer: Self-pay | Admitting: Emergency Medicine

## 2018-06-12 LAB — URINALYSIS, ROUTINE W REFLEX MICROSCOPIC
BILIRUBIN URINE: NEGATIVE
Glucose, UA: NEGATIVE mg/dL
HGB URINE DIPSTICK: NEGATIVE
Ketones, ur: NEGATIVE mg/dL
Leukocytes, UA: NEGATIVE
Nitrite: NEGATIVE
Protein, ur: NEGATIVE mg/dL
SPECIFIC GRAVITY, URINE: 1.03 (ref 1.005–1.030)
pH: 6 (ref 5.0–8.0)

## 2018-06-12 LAB — COMPREHENSIVE METABOLIC PANEL
ALK PHOS: 71 U/L (ref 38–126)
ALT: 37 U/L (ref 0–44)
ANION GAP: 10 (ref 5–15)
AST: 26 U/L (ref 15–41)
Albumin: 4.1 g/dL (ref 3.5–5.0)
BILIRUBIN TOTAL: 0.5 mg/dL (ref 0.3–1.2)
BUN: 15 mg/dL (ref 6–20)
CALCIUM: 9.3 mg/dL (ref 8.9–10.3)
CO2: 26 mmol/L (ref 22–32)
CREATININE: 1.33 mg/dL — AB (ref 0.61–1.24)
Chloride: 102 mmol/L (ref 98–111)
GFR calc non Af Amer: 59 mL/min — ABNORMAL LOW (ref >60)
Glucose, Bld: 139 mg/dL — ABNORMAL HIGH (ref 70–99)
Potassium: 3.5 mmol/L (ref 3.5–5.1)
SODIUM: 138 mmol/L (ref 135–145)
TOTAL PROTEIN: 7.4 g/dL (ref 6.5–8.1)

## 2018-06-12 LAB — CBC
HCT: 42 % (ref 39.0–52.0)
Hemoglobin: 14.3 g/dL (ref 13.0–17.0)
MCH: 31 pg (ref 26.0–34.0)
MCHC: 34 g/dL (ref 30.0–36.0)
MCV: 90.9 fL (ref 78.0–100.0)
PLATELETS: 238 10*3/uL (ref 150–400)
RBC: 4.62 MIL/uL (ref 4.22–5.81)
RDW: 12.8 % (ref 11.5–15.5)
WBC: 4.8 10*3/uL (ref 4.0–10.5)

## 2018-06-12 LAB — LIPASE, BLOOD: Lipase: 51 U/L (ref 11–51)

## 2018-06-12 LAB — I-STAT TROPONIN, ED: Troponin i, poc: 0 ng/mL (ref 0.00–0.08)

## 2018-06-12 MED ORDER — FAMOTIDINE 20 MG PO TABS: 20.0000 mg | ORAL_TABLET | Freq: Two times a day (BID) | ORAL | 0 refills | Status: DC

## 2018-06-12 MED ORDER — GI COCKTAIL ~~LOC~~
30.0000 mL | Freq: Once | ORAL | Status: AC
  Administered 2018-06-12: 30 mL via ORAL
  Filled 2018-06-12: qty 30

## 2018-06-12 NOTE — ED Triage Notes (Signed)
Pt states that he was asleep and must have vomited in his sleep because when he woke up he had vomit in his nose. Denies active nausea, abdominal pain, or SOB currently. But feels a burning sensation in his nose and has "indigestion real bad." States that he thinks he might have vomited from something he ate but is unsure.

## 2018-06-12 NOTE — ED Notes (Signed)
Patient verbalizes understanding of discharge instructions. Opportunity for questioning and answers were provided. Armband removed by staff, pt discharged from ED ambulatory.   

## 2018-06-12 NOTE — ED Provider Notes (Signed)
[place] EMERGENCY DEPARTMENT Provider Note   CSN: [id number] Arrival date & time: 06/11/18  2352     History   Chief Complaint Chief Complaint  Patient presents with  . Emesis    HPI Zachary [name] is a 57 y.o. male.  Patient presents with nausea and vomiting.  He reports that he was asleep when he woke up with a burning sensation in his chest, throat and nose.  He noticed that he had vomited a small amount while he was sleeping.  He had one more episode of emesis following this.  He reports that he continues to have burning pain across his chest but pain in his throat and nose has improved.  No further nausea here in the ER.     Past Medical History:  Diagnosis Date  . Asthma   . Knee pain     There are no active problems to display for this patient.   Past Surgical History:  Procedure Laterality Date  . KNEE ARTHROSCOPY     x 2        Home Medications    Prior to Admission medications   Medication Sig Start Date End Date Taking? Authorizing Provider  albuterol (PROVENTIL HFA;VENTOLIN HFA) 108 (90 BASE) MCG/ACT inhaler Inhale 2 puffs into the lungs every 2 (two) hours as needed for wheezing or shortness of breath (cough). 02/10/15   Street, Mercedes, PA-C  benzonatate (TESSALON) 100 MG capsule Take 1 capsule (100 mg total) by mouth every 8 (eight) hours. Patient not taking: Reported on 07/25/2016 06/10/14   Palumbo, April, MD  cephALEXin (KEFLEX) 500 MG capsule Take 1 capsule (500 mg total) by mouth 4 (four) times daily. Patient not taking: Reported on 07/25/2016 11/30/15   Konrad Felix, PA  famotidine (PEPCID) 20 MG tablet Take 1 tablet (20 mg total) by mouth 2 (two) times daily. 06/12/18   Orpah Greek, MD  fluticasone (FLONASE) 50 MCG/ACT nasal spray Place 2 sprays into both nostrils daily. 11/11/17   Carmin Muskrat, MD  HYDROcodone-acetaminophen (NORCO/VICODIN) 5-325 MG tablet Take 1 tablet by mouth every 6 (six) hours as  needed for severe pain. Patient not taking: Reported on 07/25/2016 02/17/16   Etta Quill, NP  HYDROcodone-homatropine [facility]) 5-1.5 MG/5ML syrup Take 5 mLs by mouth every 6 (six) hours as needed for cough. Patient not taking: Reported on 07/25/2016 10/14/14   Montine Circle, PA-C  naproxen (NAPROSYN) 500 MG tablet Take 1 tablet (500 mg total) by mouth 2 (two) times daily. Patient not taking: Reported on 07/25/2016 02/17/16   Etta Quill, NP  oxymetazoline [facility] NASAL SPRAY) 0.05 % nasal spray Place 1 spray into both nostrils 2 (two) times daily. 07/25/16   Nona Dell, PA-C  predniSONE (DELTASONE) 20 MG tablet 3 tabs po daily x 4 days Patient not taking: Reported on 07/25/2016 02/10/15   Street, [place], PA-C  pseudoephedrine (SUDAFED 12 HOUR) 120 MG 12 hr tablet Take 1 tablet (120 mg total) by mouth 2 (two) times daily. Use twice daily for five days 11/11/17   Carmin Muskrat, MD  Pseudoephedrine-Ibuprofen (IBUPROFEN AND PSE COLD & SINUS PO) Take 1 tablet by mouth daily as needed. For cold symptoms    [provider]    Family History No family history on file.  Social History Social History   Tobacco Use  . Smoking status: Former Research scientist (life sciences)  . Smokeless tobacco: Never Used  Substance Use Topics  . Alcohol use: No  . Drug use: No  Allergies   Patient has no known allergies.   Review of Systems Review of Systems  Cardiovascular: Positive for chest pain.  Gastrointestinal: Positive for nausea and vomiting.  All other systems reviewed and are negative.    Physical Exam Updated Vital Signs BP (!) 114/91   Pulse 62   Temp 99.3 F (37.4 C)   Resp 16   SpO2 94%   Physical Exam  Constitutional: He is oriented to person, place, and time. He appears well-developed and well-nourished. No distress.  HENT:  Head: Normocephalic and atraumatic.  Right Ear: Hearing normal.  Left Ear: Hearing normal.  Nose: Nose normal.  Mouth/Throat: Oropharynx is clear  and moist and mucous membranes are normal.  Eyes: Pupils are equal, round, and reactive to light. Conjunctivae and EOM are normal.  Neck: Normal range of motion. Neck supple.  Cardiovascular: Regular rhythm, S1 normal and S2 normal. Exam reveals no gallop and no friction rub.  No murmur heard. Pulmonary/Chest: Effort normal and breath sounds normal. No respiratory distress. He exhibits no tenderness.  Abdominal: Soft. Normal appearance and bowel sounds are normal. There is no hepatosplenomegaly. There is no tenderness. There is no rebound, no guarding, no tenderness at McBurney's point and negative Murphy's sign. No hernia.  Musculoskeletal: Normal range of motion.  Neurological: He is alert and oriented to person, place, and time. He has normal strength. No cranial nerve deficit or sensory deficit. Coordination normal. GCS eye subscore is 4. GCS verbal subscore is 5. GCS motor subscore is 6.  Skin: Skin is warm, dry and intact. No rash noted. No cyanosis.  Psychiatric: He has a normal mood and affect. His speech is normal and behavior is normal. Thought content normal.  Nursing note and vitals reviewed.    ED Treatments / Results  Labs (all labs ordered are listed, but only abnormal results are displayed) Labs Reviewed  COMPREHENSIVE METABOLIC PANEL - Abnormal; Notable for the following components:      Result Value   Glucose, Bld 139 (*)    Creatinine, Ser 1.33 (*)    GFR calc non Af Amer 59 (*)    All other components within normal limits  LIPASE, BLOOD  CBC  URINALYSIS, ROUTINE W REFLEX MICROSCOPIC  I-STAT TROPONIN, ED    EKG None   ED ECG REPORT   Date: 06/12/2018  Rate: 62  Rhythm: normal sinus rhythm  QRS Axis: normal  Intervals: normal  ST/T Wave abnormalities: normal  Conduction Disutrbances:none  Narrative Interpretation:   Old EKG Reviewed: none available  I have personally reviewed the EKG tracing and agree with the computerized printout as  noted.   Radiology No results found.  Procedures Procedures (including critical care time)  Medications Ordered in ED Medications  gi cocktail (Maalox,Lidocaine,Donnatal) (30 mLs Oral Given 06/12/18 0249)     Initial Impression / Assessment and Plan / ED Course  I have reviewed the triage vital signs and the nursing notes.  Pertinent labs & imaging results that were available during my care of the patient were reviewed by me and considered in my medical decision making (see chart for details).     Patient presents to the emergency department stating that he has had nausea and vomiting tonight.  He had burning sensation in his nose, throat after vomiting.  He also complained of chest discomfort which began after the vomiting.  He does not have any known cardiac disease.  Lab work unremarkable.  This included EKG and troponin, patient not felt to  be experiencing cardiac etiology at this time.  Nausea and vomiting has resolved.  Patient complaining of "indigestion".  He was treated with GI cocktail with resolution.  Will prescribe Pepcid, return as needed.  Final Clinical Impressions(s) / ED Diagnoses   Final diagnoses:  Non-intractable vomiting with nausea, unspecified vomiting type    ED Discharge Orders         Ordered    famotidine (PEPCID) 20 MG tablet  2 times daily     06/12/18 0622           Orpah Greek, MD 06/12/18 [phone]

## 2018-08-09 ENCOUNTER — Encounter (HOSPITAL_COMMUNITY): Payer: Self-pay

## 2018-08-09 ENCOUNTER — Emergency Department (HOSPITAL_COMMUNITY)
Admission: EM | Admit: 2018-08-09 | Discharge: 2018-08-09 | Disposition: A | Discharge status: Home / Self Care | Payer: Self-pay | Attending: Emergency Medicine | Admitting: Emergency Medicine

## 2018-08-09 ENCOUNTER — Other Ambulatory Visit: Payer: Self-pay

## 2018-08-09 ENCOUNTER — Emergency Department (HOSPITAL_COMMUNITY): Payer: Self-pay

## 2018-08-09 DIAGNOSIS — Z79899 Other long term (current) drug therapy: Secondary | ICD-10-CM | POA: Insufficient documentation

## 2018-08-09 DIAGNOSIS — Z87891 Personal history of nicotine dependence: Secondary | ICD-10-CM | POA: Insufficient documentation

## 2018-08-09 DIAGNOSIS — J40 Bronchitis, not specified as acute or chronic: Secondary | ICD-10-CM | POA: Insufficient documentation

## 2018-08-09 MED ORDER — AZITHROMYCIN 250 MG PO TABS: 250.0000 mg | ORAL_TABLET | Freq: Every day | ORAL | 0 refills | Status: DC

## 2018-08-09 MED ORDER — BENZONATATE 100 MG PO CAPS: 100.0000 mg | ORAL_CAPSULE | Freq: Three times a day (TID) | ORAL | 0 refills | Status: DC

## 2018-08-09 MED ORDER — ALBUTEROL SULFATE HFA 108 (90 BASE) MCG/ACT IN AERS
2.0000 | INHALATION_SPRAY | RESPIRATORY_TRACT | Status: DC | PRN
  Administered 2018-08-09: 2 via RESPIRATORY_TRACT
  Filled 2018-08-09: qty 6.7

## 2018-08-09 NOTE — ED Provider Notes (Signed)
[place] EMERGENCY DEPARTMENT Provider Note   CSN: [id number] Arrival date & time: 08/09/18  1546     History   Chief Complaint Chief Complaint  Patient presents with  . Cough    HPI Zachary [name] is a 57 y.o. male.  Patient with history of mild asthma, no history of COPD, remote smoker presents the emergency department with ongoing productive cough, left-sided ear pain, HA. and sore throat for the past 1 week.  Patient states that he has been around grandchildren that have been sick with cold-like symptoms.  Patient denies any documented fevers or chills.  He states that he felt "hot" yesterday.  He states that he has spells of coughing that almost caused him to vomit.  He denies any chest pain or shortness of breath.  No lower extremity swelling.  Patient reports initially feeling a bit better but then symptoms got worse over the past 2 days.  Onset of symptoms acute.       Past Medical History:  Diagnosis Date  . Asthma   . Knee pain     There are no active problems to display for this patient.   Past Surgical History:  Procedure Laterality Date  . KNEE ARTHROSCOPY     x 2        Home Medications    Prior to Admission medications   Medication Sig Start Date End Date Taking? Authorizing Provider  albuterol (PROVENTIL HFA;VENTOLIN HFA) 108 (90 BASE) MCG/ACT inhaler Inhale 2 puffs into the lungs every 2 (two) hours as needed for wheezing or shortness of breath (cough). 02/10/15   Street, Mercedes, PA-C  benzonatate (TESSALON) 100 MG capsule Take 1 capsule (100 mg total) by mouth every 8 (eight) hours. Patient not taking: Reported on 07/25/2016 06/10/14   Palumbo, April, MD  cephALEXin (KEFLEX) 500 MG capsule Take 1 capsule (500 mg total) by mouth 4 (four) times daily. Patient not taking: Reported on 07/25/2016 11/30/15   Konrad Felix, PA  famotidine (PEPCID) 20 MG tablet Take 1 tablet (20 mg total) by mouth 2 (two) times daily. 06/12/18    Orpah Greek, MD  fluticasone (FLONASE) 50 MCG/ACT nasal spray Place 2 sprays into both nostrils daily. 11/11/17   Carmin Muskrat, MD  HYDROcodone-acetaminophen (NORCO/VICODIN) 5-325 MG tablet Take 1 tablet by mouth every 6 (six) hours as needed for severe pain. Patient not taking: Reported on 07/25/2016 02/17/16   Etta Quill, NP  HYDROcodone-homatropine [facility]) 5-1.5 MG/5ML syrup Take 5 mLs by mouth every 6 (six) hours as needed for cough. Patient not taking: Reported on 07/25/2016 10/14/14   Montine Circle, PA-C  naproxen (NAPROSYN) 500 MG tablet Take 1 tablet (500 mg total) by mouth 2 (two) times daily. Patient not taking: Reported on 07/25/2016 02/17/16   Etta Quill, NP  oxymetazoline Melissa Montane NASAL SPRAY) 0.05 % nasal spray Place 1 spray into both nostrils 2 (two) times daily. 07/25/16   Nona Dell, PA-C  predniSONE (DELTASONE) 20 MG tablet 3 tabs po daily x 4 days Patient not taking: Reported on 07/25/2016 02/10/15   Street, [place], PA-C  pseudoephedrine (SUDAFED 12 HOUR) 120 MG 12 hr tablet Take 1 tablet (120 mg total) by mouth 2 (two) times daily. Use twice daily for five days 11/11/17   Carmin Muskrat, MD  Pseudoephedrine-Ibuprofen (IBUPROFEN AND PSE COLD & SINUS PO) Take 1 tablet by mouth daily as needed. For cold symptoms    [provider]    Family History No family  history on file.  Social History Social History   Tobacco Use  . Smoking status: Former Research scientist (life sciences)  . Smokeless tobacco: Never Used  Substance Use Topics  . Alcohol use: No  . Drug use: No     Allergies   Patient has no known allergies.   Review of Systems Review of Systems  Constitutional: Positive for fatigue. Negative for chills and fever.  HENT: Positive for congestion, ear pain and sore throat. Negative for rhinorrhea and sinus pressure.   Eyes: Negative for redness.  Respiratory: Positive for cough and chest tightness. Negative for shortness of breath and wheezing.     Gastrointestinal: Negative for abdominal pain, diarrhea, nausea and vomiting.  Genitourinary: Negative for dysuria.  Musculoskeletal: Negative for myalgias and neck stiffness.  Skin: Negative for rash.  Neurological: Positive for headaches.  Hematological: Negative for adenopathy.     Physical Exam Updated Vital Signs BP 136/86   Pulse 78   Temp 98.6 F (37 C) (Oral)   Resp 16   Ht 5\' 6"  (1.676 m)   Wt 68 kg   SpO2 97%   BMI 24.21 kg/m   Physical Exam  Constitutional: He appears well-developed and well-nourished.  HENT:  Head: Normocephalic and atraumatic.  Right Ear: Tympanic membrane, external ear and ear canal normal.  Left Ear: Tympanic membrane, external ear and ear canal normal.  Nose: Nose normal. No mucosal edema or rhinorrhea.  Mouth/Throat: Uvula is midline and mucous membranes are normal. Mucous membranes are not dry. No trismus in the jaw. No uvula swelling. Posterior oropharyngeal erythema present. No oropharyngeal exudate, posterior oropharyngeal edema or tonsillar abscesses.  Very poor dentition.  TMs appear normal.  Mild erythema of the pharynx.  Eyes: Conjunctivae are normal. Right eye exhibits no discharge. Left eye exhibits no discharge.  Neck: Normal range of motion. Neck supple.  Cardiovascular: Normal rate, regular rhythm and normal heart sounds.  Pulmonary/Chest: Effort normal and breath sounds normal. No respiratory distress. He has no wheezes. He has no rales.  Abdominal: Soft. There is no tenderness.  Neurological: He is alert.  Skin: Skin is warm and dry.  Psychiatric: He has a normal mood and affect.  Nursing note and vitals reviewed.    ED Treatments / Results  Labs (all labs ordered are listed, but only abnormal results are displayed) Labs Reviewed - No data to display  EKG None  Radiology Dg Chest 2 View  Result Date: 08/09/2018 CLINICAL DATA:  Cough chest pain EXAM: CHEST - 2 VIEW COMPARISON:  11/11/2017 FINDINGS: The heart size  and mediastinal contours are within normal limits. Both lungs are clear. Mild aortic atherosclerosis. The visualized skeletal structures are unremarkable. IMPRESSION: No active cardiopulmonary disease. Electronically Signed   By: Donavan Foil M.D.   On: 08/09/2018 16:59    Procedures Procedures (including critical care time)  Medications Ordered in ED Medications - No data to display   Initial Impression / Assessment and Plan / ED Course  I have reviewed the triage vital signs and the nursing notes.  Pertinent labs & imaging results that were available during my care of the patient were reviewed by me and considered in my medical decision making (see chart for details).     Patient seen and examined.   Vital signs reviewed and are as follows: BP 136/86   Pulse 78   Temp 98.6 F (37 C) (Oral)   Resp 16   Ht 5\' 6"  (1.676 m)   Wt 68 kg   SpO2  97%   BMI 24.21 kg/m   Initial impression is that the patient has bronchitis.  Will check chest x-ray to evaluate for pneumonia or other pulmonary etiology.  Patient is in no distress.  No hypoxia.  5:17 PM chest x-ray reviewed.  Will discharged home with albuterol inhaler, Tessalon, azithromycin.  Patient counseled on supportive care for URI and s/s to return including worsening symptoms, persistent fever, persistent vomiting, or if they have any other concerns. Urged to see PCP if symptoms persist for more than 3 days. Patient verbalizes understanding and agrees with plan.    Final Clinical Impressions(s) / ED Diagnoses   Final diagnoses:  Bronchitis   Patient with likely bronchitis. No concern for PNA given normal lung exam/x-ray. Antibiotics given due to symptoms greater than 7 days as well as suspicion of biphasic symptom pattern.  Conservative therapy indicated.  Vitals reassuring.  Return instructions as above.    ED Discharge Orders         Ordered    azithromycin (ZITHROMAX) 250 MG tablet  Daily,   Status:  Discontinued       08/09/18 1712    benzonatate (TESSALON) 100 MG capsule  Every 8 hours     08/09/18 1712    azithromycin (ZITHROMAX) 250 MG tablet  Daily     08/09/18 1714           Carlisle Cater, PA-C 08/09/18 1721    Mesner, Corene Cornea, MD 08/10/18 2015

## 2018-08-09 NOTE — ED Triage Notes (Signed)
Pt endorses cough, chest tightness, throat pain/swelling, left ear pain and left sided headache x1 week.

## 2018-08-09 NOTE — ED Notes (Signed)
ED Provider at bedside. 

## 2018-08-09 NOTE — ED Notes (Signed)
Patient transported to x-ray. ?

## 2018-08-09 NOTE — Discharge Instructions (Signed)
Please read and follow all provided instructions.  Your diagnoses today include:  1. Bronchitis     Tests performed today include:  Chest x-ray - does not show any pneumonia  Vital signs. See below for your results today.   Medications prescribed:   Azithromycin - antibiotic for respiratory infection  You have been prescribed an antibiotic medicine: take the entire course of medicine even if you are feeling better. Stopping early can cause the antibiotic not to work.   Tessalon Perles - cough suppressant medication   Albuterol inhaler - medication that opens up your airway  Use inhaler as follows: 1-2 puffs with spacer every 4 hours as needed for wheezing, cough, or shortness of breath.   Take any prescribed medications only as directed.  Home care instructions:  Follow any educational materials contained in this packet.  Follow-up instructions: Please follow-up with your primary care provider in the next 3 days for further evaluation of your symptoms and a recheck if you are not feeling better.   Return instructions:   Please return to the Emergency Department if you experience worsening symptoms.  Please return with worsening wheezing, shortness of breath, or difficulty breathing.  Return with persistent fever above 101F.   Please return if you have any other emergent concerns.  Additional Information:  Your vital signs today were: BP 132/80    Pulse 82    Temp 98.6 F (37 C) (Oral)    Resp 18    Ht 5\' 6"  (1.676 m)    Wt 68 kg    SpO2 99%    BMI 24.21 kg/m  If your blood pressure (BP) was elevated above 135/85 this visit, please have this repeated by your doctor within one month. --------------

## 2018-08-10 ENCOUNTER — Emergency Department (HOSPITAL_COMMUNITY)
Admission: EM | Admit: 2018-08-10 | Discharge: 2018-08-10 | Disposition: A | Discharge status: Home / Self Care | Payer: Self-pay | Attending: Emergency Medicine | Admitting: Emergency Medicine

## 2018-08-10 ENCOUNTER — Other Ambulatory Visit: Payer: Self-pay

## 2018-08-10 ENCOUNTER — Encounter (HOSPITAL_COMMUNITY): Payer: Self-pay | Admitting: *Deleted

## 2018-08-10 DIAGNOSIS — J45909 Unspecified asthma, uncomplicated: Secondary | ICD-10-CM | POA: Insufficient documentation

## 2018-08-10 DIAGNOSIS — N481 Balanitis: Secondary | ICD-10-CM | POA: Insufficient documentation

## 2018-08-10 DIAGNOSIS — Z87891 Personal history of nicotine dependence: Secondary | ICD-10-CM | POA: Insufficient documentation

## 2018-08-10 DIAGNOSIS — Z79899 Other long term (current) drug therapy: Secondary | ICD-10-CM | POA: Insufficient documentation

## 2018-08-10 MED ORDER — CLOTRIMAZOLE 1 % EX CREA: TOPICAL_CREAM | CUTANEOUS | 0 refills | Status: DC

## 2018-08-10 NOTE — ED Triage Notes (Signed)
Pt states irritation and itching to penis for several days.  Denies discharge or uti s/s.

## 2018-08-10 NOTE — ED Provider Notes (Signed)
[place] EMERGENCY DEPARTMENT Provider Note   CSN: [id number] Arrival date & time: 08/10/18  2778     History   Chief Complaint Chief Complaint  Patient presents with  . Rash    HPI Zachary [name] is a 57 y.o. male.  HPI  57 year old male presents today complaining of rash to this for 2 to 3 days.  He describes some irritation of the skin.  He denies any urethral discharge, burning with urination, or any STDs.  He states he is sexually active with 2 sexual partners in the past year.  He denies any other medical problems.  He has not had similar symptoms in the past.  Treatment to this.  He denies any fever or chills.  Past Medical History:  Diagnosis Date  . Asthma   . Knee pain     There are no active problems to display for this patient.   Past Surgical History:  Procedure Laterality Date  . KNEE ARTHROSCOPY     x 2        Home Medications    Prior to Admission medications   Medication Sig Start Date End Date Taking? Authorizing Provider  albuterol (PROVENTIL HFA;VENTOLIN HFA) 108 (90 BASE) MCG/ACT inhaler Inhale 2 puffs into the lungs every 2 (two) hours as needed for wheezing or shortness of breath (cough). 02/10/15   Street, Mercedes, PA-C  azithromycin (ZITHROMAX) 250 MG tablet Take 1 tablet (250 mg total) by mouth daily. Take first 2 tablets together, then 1 every day until finished. 08/09/18   Carlisle Cater, PA-C  benzonatate (TESSALON) 100 MG capsule Take 1 capsule (100 mg total) by mouth every 8 (eight) hours. 08/09/18   Carlisle Cater, PA-C  clotrimazole (LOTRIMIN) 1 % cream Apply to affected area 2 times daily 08/10/18   Pattricia Boss, MD  famotidine (PEPCID) 20 MG tablet Take 1 tablet (20 mg total) by mouth 2 (two) times daily. 06/12/18   Orpah Greek, MD  fluticasone (FLONASE) 50 MCG/ACT nasal spray Place 2 sprays into both nostrils daily. 11/11/17   Carmin Muskrat, MD  HYDROcodone-acetaminophen (NORCO/VICODIN) 5-325 MG  tablet Take 1 tablet by mouth every 6 (six) hours as needed for severe pain. Patient not taking: Reported on 07/25/2016 02/17/16   Etta Quill, NP  HYDROcodone-homatropine [facility]) 5-1.5 MG/5ML syrup Take 5 mLs by mouth every 6 (six) hours as needed for cough. Patient not taking: Reported on 07/25/2016 10/14/14   Montine Circle, PA-C  naproxen (NAPROSYN) 500 MG tablet Take 1 tablet (500 mg total) by mouth 2 (two) times daily. Patient not taking: Reported on 07/25/2016 02/17/16   Etta Quill, NP  oxymetazoline Melissa Montane NASAL SPRAY) 0.05 % nasal spray Place 1 spray into both nostrils 2 (two) times daily. 07/25/16   Nona Dell, PA-C  predniSONE (DELTASONE) 20 MG tablet 3 tabs po daily x 4 days Patient not taking: Reported on 07/25/2016 02/10/15   Street, [place], PA-C  pseudoephedrine (SUDAFED 12 HOUR) 120 MG 12 hr tablet Take 1 tablet (120 mg total) by mouth 2 (two) times daily. Use twice daily for five days 11/11/17   Carmin Muskrat, MD  Pseudoephedrine-Ibuprofen (IBUPROFEN AND PSE COLD & SINUS PO) Take 1 tablet by mouth daily as needed. For cold symptoms    [provider]    Family History No family history on file.  Social History Social History   Tobacco Use  . Smoking status: Former Research scientist (life sciences)  . Smokeless tobacco: Never Used  Substance Use Topics  .  Alcohol use: No  . Drug use: No     Allergies   Patient has no known allergies.   Review of Systems Review of Systems  All other systems reviewed and are negative.    Physical Exam Updated Vital Signs BP 139/87 (BP Location: Right Arm)   Pulse 77   Temp 97.7 F (36.5 C) (Oral)   Resp 16   Ht 1.676 m (5\' 6" )   Wt 68 kg   SpO2 94%   BMI 24.21 kg/m   Physical Exam  Constitutional: He is oriented to person, place, and time. He appears well-developed.  HENT:  Head: Normocephalic and atraumatic.  Right Ear: External ear normal.  Left Ear: External ear normal.  Nose: Nose normal.  Eyes: EOM are  normal.  Neck: No tracheal deviation present.  Pulmonary/Chest: Effort normal.  Abdominal: A hernia is present. Hernia confirmed positive in the right inguinal area.  Genitourinary: Testes normal. Circumcised.  Genitourinary Comments: Erythema of glans c.w. balanitis  Musculoskeletal: Normal range of motion.  Neurological: He is alert and oriented to person, place, and time.  Skin: Skin is warm and dry.  Psychiatric: He has a normal mood and affect. His behavior is normal.  Nursing note and vitals reviewed.    ED Treatments / Results  Labs (all labs ordered are listed, but only abnormal results are displayed) Labs Reviewed - No data to display  EKG None  Radiology Dg Chest 2 View  Result Date: 08/09/2018 CLINICAL DATA:  Cough chest pain EXAM: CHEST - 2 VIEW COMPARISON:  11/11/2017 FINDINGS: The heart size and mediastinal contours are within normal limits. Both lungs are clear. Mild aortic atherosclerosis. The visualized skeletal structures are unremarkable. IMPRESSION: No active cardiopulmonary disease. Electronically Signed   By: Donavan Foil M.D.   On: 08/09/2018 16:59    Procedures Procedures (including critical care time)  Medications Ordered in ED Medications - No data to display   Initial Impression / Assessment and Plan / ED Course  I have reviewed the triage vital signs and the nursing notes.  Pertinent labs & imaging results that were available during my care of the patient were reviewed by me and considered in my medical decision making (see chart for details).       Final Clinical Impressions(s) / ED Diagnoses   Final diagnoses:  Balanitis    ED Discharge Orders         Ordered    clotrimazole (LOTRIMIN) 1 % cream     08/10/18 0716           Pattricia Boss, MD 08/10/18 [phone]

## 2018-09-04 ENCOUNTER — Emergency Department (HOSPITAL_COMMUNITY)
Admission: EM | Admit: 2018-09-04 | Discharge: 2018-09-04 | Disposition: A | Discharge status: Home / Self Care | Payer: Self-pay | Attending: Emergency Medicine | Admitting: Emergency Medicine

## 2018-09-04 ENCOUNTER — Encounter (HOSPITAL_COMMUNITY): Payer: Self-pay | Admitting: Emergency Medicine

## 2018-09-04 ENCOUNTER — Other Ambulatory Visit: Payer: Self-pay

## 2018-09-04 DIAGNOSIS — J020 Streptococcal pharyngitis: Secondary | ICD-10-CM | POA: Insufficient documentation

## 2018-09-04 DIAGNOSIS — Z87891 Personal history of nicotine dependence: Secondary | ICD-10-CM | POA: Insufficient documentation

## 2018-09-04 DIAGNOSIS — Z79899 Other long term (current) drug therapy: Secondary | ICD-10-CM | POA: Insufficient documentation

## 2018-09-04 DIAGNOSIS — J45909 Unspecified asthma, uncomplicated: Secondary | ICD-10-CM | POA: Insufficient documentation

## 2018-09-04 LAB — GROUP A STREP BY PCR: GROUP A STREP BY PCR: DETECTED — AB

## 2018-09-04 MED ORDER — DEXAMETHASONE 4 MG PO TABS
10.0000 mg | ORAL_TABLET | Freq: Once | ORAL | Status: AC
  Administered 2018-09-04: 10 mg via ORAL
  Filled 2018-09-04: qty 3

## 2018-09-04 MED ORDER — AMOXICILLIN 500 MG PO CAPS: 1000.0000 mg | ORAL_CAPSULE | Freq: Two times a day (BID) | ORAL | 0 refills | Status: DC

## 2018-09-04 MED ORDER — AMOXICILLIN 500 MG PO CAPS
1000.0000 mg | ORAL_CAPSULE | Freq: Once | ORAL | Status: AC
  Administered 2018-09-04: 1000 mg via ORAL
  Filled 2018-09-04: qty 2

## 2018-09-04 NOTE — ED Triage Notes (Signed)
Pt reports sore throat that started yesterday. Difficulty sleeping and headache. Pt has attempted otc medications with no relief.

## 2018-09-04 NOTE — ED Notes (Signed)
Patient verbalizes understanding of medications and discharge instructions. No further questions at this time. VSS and patient ambulatory at discharge.   

## 2018-09-04 NOTE — ED Provider Notes (Signed)
[place] EMERGENCY DEPARTMENT Provider Note   CSN: [id number] Arrival date & time: 09/04/18  0041     History   Chief Complaint Chief Complaint  Patient presents with  . Sore Throat    HPI Zachary [name] is a 57 y.o. male.  The history is provided by the patient.  Sore Throat   He has history of asthma, and comes in with 2-day history of a sore throat which is mainly on the left side.  He states that it is difficult to swallow.  Pain does radiate up into the left side of his face.  There is been a slight cough which is productive of a small amount of dark sputum.  He denies fever, chills, sweats.  He denies nasal congestion or drainage.  He denies arthralgias or myalgias.  He denies any sick contacts.  He has taken acetaminophen and naproxen, which have given slight relief.  Past Medical History:  Diagnosis Date  . Asthma   . Knee pain     There are no active problems to display for this patient.   Past Surgical History:  Procedure Laterality Date  . KNEE ARTHROSCOPY     x 2        Home Medications    Prior to Admission medications   Medication Sig Start Date End Date Taking? Authorizing Provider  albuterol (PROVENTIL HFA;VENTOLIN HFA) 108 (90 BASE) MCG/ACT inhaler Inhale 2 puffs into the lungs every 2 (two) hours as needed for wheezing or shortness of breath (cough). 02/10/15   Street, Mercedes, PA-C  azithromycin (ZITHROMAX) 250 MG tablet Take 1 tablet (250 mg total) by mouth daily. Take first 2 tablets together, then 1 every day until finished. 08/09/18   Carlisle Cater, PA-C  benzonatate (TESSALON) 100 MG capsule Take 1 capsule (100 mg total) by mouth every 8 (eight) hours. 08/09/18   Carlisle Cater, PA-C  clotrimazole (LOTRIMIN) 1 % cream Apply to affected area 2 times daily 08/10/18   Pattricia Boss, MD  famotidine (PEPCID) 20 MG tablet Take 1 tablet (20 mg total) by mouth 2 (two) times daily. 06/12/18   Orpah Greek, MD    fluticasone (FLONASE) 50 MCG/ACT nasal spray Place 2 sprays into both nostrils daily. 11/11/17   Carmin Muskrat, MD  HYDROcodone-acetaminophen (NORCO/VICODIN) 5-325 MG tablet Take 1 tablet by mouth every 6 (six) hours as needed for severe pain. Patient not taking: Reported on 07/25/2016 02/17/16   Etta Quill, NP  HYDROcodone-homatropine [facility]) 5-1.5 MG/5ML syrup Take 5 mLs by mouth every 6 (six) hours as needed for cough. Patient not taking: Reported on 07/25/2016 10/14/14   Montine Circle, PA-C  naproxen (NAPROSYN) 500 MG tablet Take 1 tablet (500 mg total) by mouth 2 (two) times daily. Patient not taking: Reported on 07/25/2016 02/17/16   Etta Quill, NP  oxymetazoline Melissa Montane NASAL SPRAY) 0.05 % nasal spray Place 1 spray into both nostrils 2 (two) times daily. 07/25/16   Nona Dell, PA-C  predniSONE (DELTASONE) 20 MG tablet 3 tabs po daily x 4 days Patient not taking: Reported on 07/25/2016 02/10/15   Street, [place], PA-C  pseudoephedrine (SUDAFED 12 HOUR) 120 MG 12 hr tablet Take 1 tablet (120 mg total) by mouth 2 (two) times daily. Use twice daily for five days 11/11/17   Carmin Muskrat, MD  Pseudoephedrine-Ibuprofen (IBUPROFEN AND PSE COLD & SINUS PO) Take 1 tablet by mouth daily as needed. For cold symptoms    [provider]    Family  History No family history on file.  Social History Social History   Tobacco Use  . Smoking status: Former Research scientist (life sciences)  . Smokeless tobacco: Never Used  Substance Use Topics  . Alcohol use: No  . Drug use: No     Allergies   Patient has no known allergies.   Review of Systems Review of Systems  All other systems reviewed and are negative.    Physical Exam Updated Vital Signs Pulse (!) 102   Resp 16   Ht 5\' 6"  (1.676 m)   Wt 68 kg   SpO2 96%   BMI 24.21 kg/m   Physical Exam Vitals signs and nursing note reviewed.    57 year old male, resting comfortably and in no acute distress. Vital signs are significant  for mild elevation of heart rate. Oxygen saturation is 96%, which is normal. Head is normocephalic and atraumatic. PERRLA, EOMI. Oropharynx is mildly erythematous without exudate.  There is no pooling of secretions.  Phonation is normal. Neck is nontender and supple without adenopathy or JVD. Back is nontender and there is no CVA tenderness. Lungs are clear without rales, wheezes, or rhonchi. Chest is nontender. Heart has regular rate and rhythm without murmur. Abdomen is soft, flat, nontender without masses or hepatosplenomegaly and peristalsis is normoactive. Extremities have no cyanosis or edema, full range of motion is present. Skin is warm and dry without rash. Neurologic: Mental status is normal, cranial nerves are intact, there are no motor or sensory deficits.  ED Treatments / Results  Labs (all labs ordered are listed, but only abnormal results are displayed) Labs Reviewed  GROUP A STREP BY PCR - Abnormal; Notable for the following components:      Result Value   Group A Strep by PCR DETECTED (*)    All other components within normal limits   Procedures Procedures  Medications Ordered in ED Medications  amoxicillin (AMOXIL) capsule 1,000 mg (has no administration in time range)  dexamethasone (DECADRON) tablet 10 mg (10 mg Oral Given 09/04/18 0200)     Initial Impression / Assessment and Plan / ED Course  I have reviewed the triage vital signs and the nursing notes.  Pertinent labs & imaging results that were available during my care of the patient were reviewed by me and considered in my medical decision making (see chart for details).  Pharyngitis which is probably viral.  Will obtain strep screen.  He is given a dose of dexamethasone.  Old records are reviewed, and he has several prior ED visits for respiratory tract infections.  Strep screen is positive.  He is given an initial dose of amoxicillin and sent home with prescription for same.  Final Clinical  Impressions(s) / ED Diagnoses   Final diagnoses:  Strep pharyngitis    ED Discharge Orders         Ordered    amoxicillin (AMOXIL) 500 MG capsule  2 times daily     09/04/18 7416           Delora Fuel, MD 38/45/36 0301

## 2018-11-24 ENCOUNTER — Encounter (HOSPITAL_COMMUNITY): Payer: Self-pay

## 2018-11-24 ENCOUNTER — Emergency Department (HOSPITAL_COMMUNITY)
Admission: EM | Admit: 2018-11-24 | Discharge: 2018-11-24 | Disposition: A | Discharge status: Home / Self Care | Payer: Self-pay | Attending: Emergency Medicine | Admitting: Emergency Medicine

## 2018-11-24 ENCOUNTER — Other Ambulatory Visit: Payer: Self-pay

## 2018-11-24 DIAGNOSIS — Z79899 Other long term (current) drug therapy: Secondary | ICD-10-CM | POA: Insufficient documentation

## 2018-11-24 DIAGNOSIS — Z87891 Personal history of nicotine dependence: Secondary | ICD-10-CM | POA: Insufficient documentation

## 2018-11-24 DIAGNOSIS — J45909 Unspecified asthma, uncomplicated: Secondary | ICD-10-CM | POA: Insufficient documentation

## 2018-11-24 DIAGNOSIS — B356 Tinea cruris: Secondary | ICD-10-CM | POA: Insufficient documentation

## 2018-11-24 MED ORDER — CLOTRIMAZOLE 1 % EX CREA: TOPICAL_CREAM | CUTANEOUS | 0 refills | Status: DC

## 2018-11-24 NOTE — ED Triage Notes (Signed)
Pt notes swollen area to left groin. States had ring worm on groin a month ago and took medication for this. Noted swelling this past Friday (2 days ago). Denies fever, chills, body aches. No penile discharge noted.

## 2018-11-26 NOTE — ED Provider Notes (Signed)
[facility] EMERGENCY DEPARTMENT Provider Note   CSN: [id number] Arrival date & time: 11/24/18  2024    History   Chief Complaint Chief Complaint  Patient presents with  . Groin Swelling    HPI Zachary [name] is a 58 y.o. male.     HPI   Presents with concern for rash in groin. Has similar symptoms in the past. Tried lotrimin but doesn't think it helped. Has had symptoms for about 2 days. No pain, just some itching and irritation to tip of penis and left groin.  No fevers, no penile discharge, no testicular pain, no trauma, no specific infectious concerns.  Also notes chronic swelling to right groin that has been present for 5 yr. Normal BM, no vomiting, no pain. Eating normally.  No urinary symptoms.   Past Medical History:  Diagnosis Date  . Asthma   . Knee pain     There are no active problems to display for this patient.   Past Surgical History:  Procedure Laterality Date  . KNEE ARTHROSCOPY     x 2        Home Medications    Prior to Admission medications   Medication Sig Start Date End Date Taking? Authorizing Provider  albuterol (PROVENTIL HFA;VENTOLIN HFA) 108 (90 BASE) MCG/ACT inhaler Inhale 2 puffs into the lungs every 2 (two) hours as needed for wheezing or shortness of breath (cough). 02/10/15   Street, [place], PA-C  amoxicillin (AMOXIL) 500 MG capsule Take 2 capsules (1,000 mg total) by mouth 2 (two) times daily. 74/25/95   Delora Fuel, MD  clotrimazole (LOTRIMIN) 1 % cream Apply to affected area 2 times daily 11/24/18   Gareth Morgan, MD  famotidine (PEPCID) 20 MG tablet Take 1 tablet (20 mg total) by mouth 2 (two) times daily. 06/12/18   Orpah Greek, MD  fluticasone (FLONASE) 50 MCG/ACT nasal spray Place 2 sprays into both nostrils daily. 11/11/17   Carmin Muskrat, MD    Family History History reviewed. No pertinent family history.  Social History Social History   Tobacco Use  . Smoking status: Former Research scientist (life sciences)   . Smokeless tobacco: Never Used  Substance Use Topics  . Alcohol use: No  . Drug use: No     Allergies   Patient has no known allergies.   Review of Systems Review of Systems  Constitutional: Negative for fever.  Respiratory: Negative for cough.   Gastrointestinal: Negative for abdominal pain, diarrhea, nausea and vomiting.  Genitourinary: Positive for penile swelling. Negative for difficulty urinating, discharge, dysuria, scrotal swelling and testicular pain.     Physical Exam Updated Vital Signs BP 121/90 (BP Location: Left Arm)   Pulse 80   Temp 98.4 F (36.9 C) (Oral)   Resp 18   Ht 5\' 5"  (1.651 m)   Wt 68 kg   SpO2 100%   BMI 24.96 kg/m   Physical Exam Vitals signs and nursing note reviewed.  Constitutional:      General: He is not in acute distress.    Appearance: He is well-developed. He is not diaphoretic.  HENT:     Head: Normocephalic and atraumatic.  Eyes:     Conjunctiva/sclera: Conjunctivae normal.  Neck:     Musculoskeletal: Normal range of motion.  Cardiovascular:     Rate and Rhythm: Normal rate and regular rhythm.     Heart sounds: Normal heart sounds.  Pulmonary:     Effort: Pulmonary effort is normal. No respiratory distress.  Abdominal:  General: There is no distension.     Palpations: Abdomen is soft.     Tenderness: There is no abdominal tenderness. There is no guarding.     Hernia: Inguinal: hernia versus cyst, nontender, no erythema.  Skin:    General: Skin is warm and dry.     Comments: Erythema, scaling left groin No significant erythema to glans, no swelling, no discharge  Neurological:     Mental Status: He is alert and oriented to person, place, and time.      ED Treatments / Results  Labs (all labs ordered are listed, but only abnormal results are displayed) Labs Reviewed - No data to display  EKG None  Radiology No results found.  Procedures Procedures (including critical care time)  Medications Ordered  in ED Medications - No data to display   Initial Impression / Assessment and Plan / ED Course  I have reviewed the triage vital signs and the nursing notes.  Pertinent labs & imaging results that were available during my care of the patient were reviewed by me and considered in my medical decision making (see chart for details).        58yo male presents with concern for pruritis and rash to groin.  Appearance most consistent with tinea cruris. No sign of balanitis at this time. Hx does not suggest UTI, torsion.  Has hernia versus cyst on exam on right side chronic for 5 years, no signs of obstruction or strangulation. Given lotrimin rx. Patient discharged in stable condition with understanding of reasons to return.   Final Clinical Impressions(s) / ED Diagnoses   Final diagnoses:  Tinea cruris    ED Discharge Orders         Ordered    clotrimazole (LOTRIMIN) 1 % cream     11/24/18 2240           Gareth Morgan, MD 11/26/18 2218

## 2019-01-09 ENCOUNTER — Emergency Department (HOSPITAL_COMMUNITY)
Admission: EM | Admit: 2019-01-09 | Discharge: 2019-01-09 | Disposition: A | Discharge status: Home / Self Care | Payer: Self-pay | Attending: Emergency Medicine | Admitting: Emergency Medicine

## 2019-01-09 ENCOUNTER — Encounter (HOSPITAL_COMMUNITY): Payer: Self-pay | Admitting: Emergency Medicine

## 2019-01-09 ENCOUNTER — Emergency Department (HOSPITAL_COMMUNITY): Payer: Self-pay

## 2019-01-09 ENCOUNTER — Other Ambulatory Visit: Payer: Self-pay

## 2019-01-09 DIAGNOSIS — R0789 Other chest pain: Secondary | ICD-10-CM | POA: Insufficient documentation

## 2019-01-09 DIAGNOSIS — Z87891 Personal history of nicotine dependence: Secondary | ICD-10-CM | POA: Insufficient documentation

## 2019-01-09 DIAGNOSIS — R42 Dizziness and giddiness: Secondary | ICD-10-CM | POA: Insufficient documentation

## 2019-01-09 DIAGNOSIS — I10 Essential (primary) hypertension: Secondary | ICD-10-CM | POA: Insufficient documentation

## 2019-01-09 DIAGNOSIS — Z79899 Other long term (current) drug therapy: Secondary | ICD-10-CM | POA: Insufficient documentation

## 2019-01-09 LAB — CBC
HCT: 41.5 % (ref 39.0–52.0)
Hemoglobin: 14.1 g/dL (ref 13.0–17.0)
MCH: 30.1 pg (ref 26.0–34.0)
MCHC: 34 g/dL (ref 30.0–36.0)
MCV: 88.5 fL (ref 80.0–100.0)
Platelets: 223 10*3/uL (ref 150–400)
RBC: 4.69 MIL/uL (ref 4.22–5.81)
RDW: 12.4 % (ref 11.5–15.5)
WBC: 3.3 10*3/uL — ABNORMAL LOW (ref 4.0–10.5)
nRBC: 0 % (ref 0.0–0.2)

## 2019-01-09 LAB — URINALYSIS, ROUTINE W REFLEX MICROSCOPIC
Bilirubin Urine: NEGATIVE
Glucose, UA: NEGATIVE mg/dL
Hgb urine dipstick: NEGATIVE
Ketones, ur: NEGATIVE mg/dL
Leukocytes,Ua: NEGATIVE
Nitrite: NEGATIVE
Protein, ur: NEGATIVE mg/dL
Specific Gravity, Urine: 1.017 (ref 1.005–1.030)
pH: 5 (ref 5.0–8.0)

## 2019-01-09 LAB — BASIC METABOLIC PANEL
Anion gap: 9 (ref 5–15)
BUN: 14 mg/dL (ref 6–20)
CO2: 25 mmol/L (ref 22–32)
Calcium: 9.4 mg/dL (ref 8.9–10.3)
Chloride: 104 mmol/L (ref 98–111)
Creatinine, Ser: 0.99 mg/dL (ref 0.61–1.24)
GFR calc Af Amer: >60 mL/min (ref >60)
GFR calc non Af Amer: >60 mL/min (ref >60)
Glucose, Bld: 104 mg/dL — ABNORMAL HIGH (ref 70–99)
Potassium: 4 mmol/L (ref 3.5–5.1)
Sodium: 138 mmol/L (ref 135–145)

## 2019-01-09 LAB — TROPONIN I
Troponin I: <0.03 ng/mL (ref <0.03)
Troponin I: <0.03 ng/mL (ref <0.03)

## 2019-01-09 MED ORDER — MECLIZINE HCL 25 MG PO TABS: 25.0000 mg | ORAL_TABLET | Freq: Three times a day (TID) | ORAL | 0 refills | Status: DC | PRN

## 2019-01-09 MED ORDER — MECLIZINE HCL 25 MG PO TABS
25.0000 mg | ORAL_TABLET | Freq: Once | ORAL | Status: AC
  Administered 2019-01-09: 25 mg via ORAL
  Filled 2019-01-09: qty 1

## 2019-01-09 NOTE — ED Provider Notes (Signed)
[facility] EMERGENCY DEPARTMENT Provider Note   CSN: [id number] Arrival date & time: 01/09/19  4098    History   Chief Complaint Chief Complaint  Patient presents with  . Dizziness    HPI Zachary [name] is a 58 y.o. male.     58 y/o male with no PMH presents to the ED for complaints of dizziness and HTN. Patient reports that this morning when he woke up he felt like the room was spinning. This was made worse with bending forward and turning his head. This prompted him to have his neighbors health aid check his blood pressure and he reports that systolic it was over 119 so he came to the ED. He has not had blood pressure issue in the past but reports he has not seen PMD in years. He endorses L sided chest  "burning" briefly around 7am and reports in the past he has had several episodes of intermittant left chest burning associated sometimes with L writs and left shoulder burning. The burning is not exertional and lasts a few minutes before subsiding on its own. Reports a history of sinus infections quite often as well. Denies cough, fever, leg swelling, n/v/d, SOB. Currently not having any chest pain or dizziness at rest.      Past Medical History:  Diagnosis Date  . Asthma   . Knee pain     There are no active problems to display for this patient.   Past Surgical History:  Procedure Laterality Date  . KNEE ARTHROSCOPY     x 2        Home Medications    Prior to Admission medications   Medication Sig Start Date End Date Taking? Authorizing Provider  albuterol (PROVENTIL HFA;VENTOLIN HFA) 108 (90 BASE) MCG/ACT inhaler Inhale 2 puffs into the lungs every 2 (two) hours as needed for wheezing or shortness of breath (cough). 02/10/15   Street, [place], PA-C  amoxicillin (AMOXIL) 500 MG capsule Take 2 capsules (1,000 mg total) by mouth 2 (two) times daily. 14/78/29   Delora Fuel, MD  clotrimazole (LOTRIMIN) 1 % cream Apply to affected area 2 times  daily 11/24/18   Gareth Morgan, MD  famotidine (PEPCID) 20 MG tablet Take 1 tablet (20 mg total) by mouth 2 (two) times daily. 06/12/18   Orpah Greek, MD  fluticasone (FLONASE) 50 MCG/ACT nasal spray Place 2 sprays into both nostrils daily. 11/11/17   Carmin Muskrat, MD  meclizine (ANTIVERT) 25 MG tablet Take 1 tablet (25 mg total) by mouth 3 (three) times daily as needed for dizziness. 01/09/19   Alveria Apley, PA-C    Family History No family history on file.  Social History Social History   Tobacco Use  . Smoking status: Former Research scientist (life sciences)  . Smokeless tobacco: Never Used  Substance Use Topics  . Alcohol use: No  . Drug use: No     Allergies   Patient has no known allergies.   Review of Systems Review of Systems  Constitutional: Negative for chills, diaphoresis and fever.  HENT: Positive for sinus pressure and sinus pain. Negative for ear pain, postnasal drip and sore throat.   Eyes: Negative for pain and visual disturbance.  Respiratory: Negative for cough and shortness of breath.   Cardiovascular: Positive for chest pain ("burning"). Negative for palpitations.  Gastrointestinal: Negative for abdominal pain, nausea and vomiting.  Genitourinary: Negative for dysuria and hematuria.  Musculoskeletal: Positive for arthralgias. Negative for back pain, myalgias, neck pain and neck  stiffness.  Skin: Negative for color change and rash.  Neurological: Positive for dizziness. Negative for seizures, syncope, weakness, light-headedness and headaches.  All other systems reviewed and are negative.    Physical Exam Updated Vital Signs BP 135/86   Pulse (!) 49   Temp 97.9 F (36.6 C) (Oral)   Resp 14   Ht 5\' 5"  (1.651 m)   Wt 70.8 kg   SpO2 97%   BMI 25.96 kg/m   Physical Exam Vitals signs and nursing note reviewed.  Constitutional:      General: He is not in acute distress.    Appearance: Normal appearance. He is well-developed. He is not ill-appearing,  toxic-appearing or diaphoretic.  HENT:     Head: Normocephalic and atraumatic.     Left Ear: Tympanic membrane normal.     Ears:     Comments: Mild fluid behind R TM    Nose: Nose normal.     Mouth/Throat:     Mouth: Mucous membranes are moist.     Pharynx: Oropharynx is clear.  Eyes:     Conjunctiva/sclera: Conjunctivae normal.  Neck:     Musculoskeletal: Neck supple.  Cardiovascular:     Rate and Rhythm: Normal rate and regular rhythm.     Heart sounds: No murmur.  Pulmonary:     Effort: Pulmonary effort is normal. No respiratory distress.     Breath sounds: Normal breath sounds.  Abdominal:     General: Abdomen is flat.     Palpations: Abdomen is soft.     Tenderness: There is no abdominal tenderness.  Musculoskeletal:     Right lower leg: No edema.     Left lower leg: No edema.  Skin:    General: Skin is warm and dry.  Neurological:     General: No focal deficit present.     Mental Status: He is alert.     Cranial Nerves: No cranial nerve deficit.     Motor: No weakness.     Gait: Gait normal.  Psychiatric:        Mood and Affect: Mood normal.      ED Treatments / Results  Labs (all labs ordered are listed, but only abnormal results are displayed) Labs Reviewed  BASIC METABOLIC PANEL - Abnormal; Notable for the following components:      Result Value   Glucose, Bld 104 (*)    All other components within normal limits  CBC - Abnormal; Notable for the following components:   WBC 3.3 (*)    All other components within normal limits  TROPONIN I  URINALYSIS, ROUTINE W REFLEX MICROSCOPIC  TROPONIN I    EKG None  Radiology Dg Chest Port 1 View  Result Date: 01/09/2019 CLINICAL DATA:  Chest burning and shortness of breath. EXAM: PORTABLE CHEST 1 VIEW COMPARISON:  Chest x-ray dated August 09, 2018. FINDINGS: The heart size and mediastinal contours are within normal limits. Atherosclerotic calcification of the aortic arch. Normal pulmonary vascularity. No  focal consolidation, pleural effusion, or pneumothorax. No acute osseous abnormality. IMPRESSION: No active disease. Electronically Signed   By: Titus Dubin M.D.   On: 01/09/2019 09:53    Procedures Procedures (including critical care time)  Medications Ordered in ED Medications  meclizine (ANTIVERT) tablet 25 mg (25 mg Oral Given 01/09/19 1006)     Initial Impression / Assessment and Plan / ED Course  I have reviewed the triage vital signs and the nursing notes.  Pertinent labs & imaging results that  were available during my care of the patient were reviewed by me and considered in my medical decision making (see chart for details).  Clinical Course as of Jan 08 1322  Thu Jan 09, 2019  0942 Patient, who does not see PMD, here for dizziness associated with HTN and one episode of transient left chest burning around 7am. BP observed by myself to be 143/98 initially. Patient appearing well. Will obtain cardiac workup. I feel as though the dizziness is associated with vertigo from sinus infection that he is taking amoxicillin for. However, I will also obtain orthostatic vital signs and I will give him meclizine.    [KM]  5329 Patient is asymptomatic after meclizine.  Orthostatics negative, able to ambulate without symptoms.  No chest pain while here in the emergency department.  Work-up unremarkable including urinalysis, BMP, CMP, troponin and chest x-ray.  No changes on EKG from previous EKG. Suspect vertigo secondary to allergic rhinitis/sinusitis. Waiting on second trop.  If second troponin is normal the patient will go home with prescription for meclizine.   [KM]    Clinical Course User Index [KM] Alveria Apley, PA-C       Based on review of vitals, medical screening exam, lab work and/or imaging, there does not appear to be an acute, emergent etiology for the patient's symptoms. Counseled pt on good return precautions and encouraged both PCP and ED follow-up as needed.  Prior to  discharge, I also discussed incidental imaging findings with patient in detail and advised appropriate, recommended follow-up in detail.  Clinical Impression: 1. Dizziness   2. Atypical chest pain     Disposition: Discharge  Prior to providing a prescription for a controlled substance, I independently reviewed the patient's recent prescription history on the [place]. The patient had no recent or regular prescriptions and was deemed appropriate for a brief, less than 3 day prescription of narcotic for acute analgesia.  This note was prepared with assistance of Systems analyst. Occasional wrong-word or sound-a-like substitutions may have occurred due to the inherent limitations of voice recognition software.    Final Clinical Impressions(s) / ED Diagnoses   Final diagnoses:  Dizziness  Atypical chest pain    ED Discharge Orders         Ordered    meclizine (ANTIVERT) 25 MG tablet  3 times daily PRN     01/09/19 1322           Kristine Royal 01/09/19 1323    Jola Schmidt, MD 01/10/19 2149

## 2019-01-09 NOTE — ED Triage Notes (Signed)
Patient reports dizziness upon standing this morning, felt like the room was spinning and he felt off balance. He also endorses intermittent L-sided chest burning today, no history of same. He states he feels better now than he did this morning, but a friend checked his blood pressure this morning and said it was "sky-high," no hx of HTN, but he has not seen a PCP in years. Denies fevers/chills, shortness of breath, vision changes. Neuro intact.

## 2019-01-09 NOTE — Discharge Instructions (Signed)
Thank you for allowing me to care for you today. Please return to the emergency department if you have new or worsening symptoms. Take your medications as instructed.  ° °

## 2019-01-09 NOTE — ED Notes (Signed)
Ambulated pt in hallway with steady gait, pt stated he feels a lot better.

## 2019-01-09 NOTE — ED Notes (Signed)
Patient verbalized understanding of discharge instructions and denies any further needs or questions at this time. VS stable. Patient ambulatory with steady gait.  

## 2019-05-17 ENCOUNTER — Other Ambulatory Visit: Payer: Self-pay

## 2019-05-17 ENCOUNTER — Emergency Department (HOSPITAL_COMMUNITY)
Admission: EM | Admit: 2019-05-17 | Discharge: 2019-05-18 | Disposition: A | Discharge status: Home / Self Care | Payer: Self-pay | Attending: Emergency Medicine | Admitting: Emergency Medicine

## 2019-05-17 ENCOUNTER — Encounter (HOSPITAL_COMMUNITY): Payer: Self-pay | Admitting: Emergency Medicine

## 2019-05-17 DIAGNOSIS — S8012XA Contusion of left lower leg, initial encounter: Secondary | ICD-10-CM | POA: Insufficient documentation

## 2019-05-17 DIAGNOSIS — Y33XXXA Other specified events, undetermined intent, initial encounter: Secondary | ICD-10-CM | POA: Insufficient documentation

## 2019-05-17 DIAGNOSIS — Y939 Activity, unspecified: Secondary | ICD-10-CM | POA: Insufficient documentation

## 2019-05-17 DIAGNOSIS — Y999 Unspecified external cause status: Secondary | ICD-10-CM | POA: Insufficient documentation

## 2019-05-17 DIAGNOSIS — Z87891 Personal history of nicotine dependence: Secondary | ICD-10-CM | POA: Insufficient documentation

## 2019-05-17 DIAGNOSIS — J45909 Unspecified asthma, uncomplicated: Secondary | ICD-10-CM | POA: Insufficient documentation

## 2019-05-17 DIAGNOSIS — Y929 Unspecified place or not applicable: Secondary | ICD-10-CM | POA: Insufficient documentation

## 2019-05-17 LAB — CBC WITH DIFFERENTIAL/PLATELET
Abs Immature Granulocytes: 0.01 10*3/uL (ref 0.00–0.07)
Basophils Absolute: 0 10*3/uL (ref 0.0–0.1)
Basophils Relative: 0 %
Eosinophils Absolute: 0.1 10*3/uL (ref 0.0–0.5)
Eosinophils Relative: 2 %
HCT: 40.8 % (ref 39.0–52.0)
Hemoglobin: 13.9 g/dL (ref 13.0–17.0)
Immature Granulocytes: 0 %
Lymphocytes Relative: 45 %
Lymphs Abs: 2.1 10*3/uL (ref 0.7–4.0)
MCH: 30.5 pg (ref 26.0–34.0)
MCHC: 34.1 g/dL (ref 30.0–36.0)
MCV: 89.7 fL (ref 80.0–100.0)
Monocytes Absolute: 0.7 10*3/uL (ref 0.1–1.0)
Monocytes Relative: 14 %
Neutro Abs: 1.9 10*3/uL (ref 1.7–7.7)
Neutrophils Relative %: 39 %
Platelets: 233 10*3/uL (ref 150–400)
RBC: 4.55 MIL/uL (ref 4.22–5.81)
RDW: 12.6 % (ref 11.5–15.5)
WBC: 4.8 10*3/uL (ref 4.0–10.5)
nRBC: 0 % (ref 0.0–0.2)

## 2019-05-17 LAB — BASIC METABOLIC PANEL
Anion gap: 8 (ref 5–15)
BUN: 9 mg/dL (ref 6–20)
CO2: 27 mmol/L (ref 22–32)
Calcium: 8.9 mg/dL (ref 8.9–10.3)
Chloride: 105 mmol/L (ref 98–111)
Creatinine, Ser: 1.01 mg/dL (ref 0.61–1.24)
GFR calc Af Amer: >60 mL/min (ref >60)
GFR calc non Af Amer: >60 mL/min (ref >60)
Glucose, Bld: 85 mg/dL (ref 70–99)
Potassium: 3.7 mmol/L (ref 3.5–5.1)
Sodium: 140 mmol/L (ref 135–145)

## 2019-05-17 MED ORDER — ACETAMINOPHEN 500 MG PO TABS
1000.0000 mg | ORAL_TABLET | Freq: Once | ORAL | Status: AC
  Administered 2019-05-18: 1000 mg via ORAL
  Filled 2019-05-17: qty 2

## 2019-05-17 NOTE — ED Provider Notes (Signed)
TIME SEEN: 11:53 PM  CHIEF COMPLAINT: Left shin pain and swelling  HPI: Patient is a 58 year old male with history of asthma who presents to the emergency department with a 1 day of pain, swelling and bruising to the left shin.  He does not recall any injury.  No redness or warmth.  No calf swelling or tenderness.  No numbness or weakness.  Able to ambulate.  No chest pain, shortness of breath, fevers, cough, vomiting, diarrhea.  No history of PE or DVT.  ROS: See HPI Constitutional: no fever  Eyes: no drainage  ENT: no runny nose   Cardiovascular:  no chest pain  Resp: no SOB  GI: no vomiting GU: no dysuria Integumentary: no rash  Allergy: no hives  Musculoskeletal: no leg swelling  Neurological: no slurred speech ROS otherwise negative  PAST MEDICAL HISTORY/PAST SURGICAL HISTORY:  Past Medical History:  Diagnosis Date  . Asthma   . Knee pain     MEDICATIONS:  Prior to Admission medications   Medication Sig Start Date End Date Taking? Authorizing Provider  albuterol (PROVENTIL HFA;VENTOLIN HFA) 108 (90 BASE) MCG/ACT inhaler Inhale 2 puffs into the lungs every 2 (two) hours as needed for wheezing or shortness of breath (cough). 02/10/15   Street, [place], PA-C  clotrimazole (LOTRIMIN) 1 % cream Apply to affected area 2 times daily Patient not taking: Reported on 01/09/2019 11/24/18   Gareth Morgan, MD  famotidine (PEPCID) 20 MG tablet Take 1 tablet (20 mg total) by mouth 2 (two) times daily. Patient not taking: Reported on 01/09/2019 06/12/18   Orpah Greek, MD  fluticasone [facility]) 50 MCG/ACT nasal spray Place 2 sprays into both nostrils daily. Patient not taking: Reported on 01/09/2019 11/11/17   Carmin Muskrat, MD  ibuprofen (ADVIL) 200 MG tablet Take 400 mg by mouth every 6 (six) hours as needed for headache.    [provider]  meclizine (ANTIVERT) 25 MG tablet Take 1 tablet (25 mg total) by mouth 3 (three) times daily as needed for dizziness. 01/09/19    Alveria Apley, PA-C    ALLERGIES:  No Known Allergies  SOCIAL HISTORY:  Social History   Tobacco Use  . Smoking status: Former Research scientist (life sciences)  . Smokeless tobacco: Never Used  Substance Use Topics  . Alcohol use: No    FAMILY HISTORY: No family history on file.  EXAM: BP (!) 139/93 (BP Location: Right Arm)   Pulse 78   Temp (!) 97.4 F (36.3 C) (Oral)   Resp 18   SpO2 96%  CONSTITUTIONAL: Alert and oriented and responds appropriately to questions. Well-appearing; well-nourished HEAD: Normocephalic EYES: Conjunctivae clear, pupils appear equal, EOMI ENT: normal nose; moist mucous membranes NECK: Supple, no meningismus, no nuchal rigidity, no LAD  CARD: RRR; S1 and S2 appreciated; no murmurs, no clicks, no rubs, no gallops RESP: Normal chest excursion without splinting or tachypnea; breath sounds clear and equal bilaterally; no wheezes, no rhonchi, no rales, no hypoxia or respiratory distress, speaking full sentences ABD/GI: Normal bowel sounds; non-distended; soft, non-tender, no rebound, no guarding, no peritoneal signs, no hepatosplenomegaly BACK:  The back appears normal and is non-tender to palpation, there is no CVA tenderness EXT: Normal ROM in all joints; compartments soft.  2+ DP pulses bilaterally.  Patient has mild soft tissue swelling, ecchymosis to the mid left shin.  There is no rash.  There is no calf tenderness or calf swelling.  No numbness throughout the leg.  No joint effusion. SKIN: Normal color for age  and race; warm; no rash NEURO: Moves all extremities equally PSYCH: The patient's mood and manner are appropriate. Grooming and personal hygiene are appropriate.  MEDICAL DECISION MAKING: Patient here with bruising, swelling to the anterior left lower extremity.  There is no calf tenderness or calf swelling.  No chest pain or shortness of breath.  States he is here because he wanted to rule out DVT.  Discussed with patient that this would be a very atypical  presentation of DVT and given he has no true risk factors and no calf swelling or calf tenderness I feel this is very unlikely.  There is no redness or warmth to this area.  I do not think this is cellulitis, abscess, superficial thrombophlebitis, lymphangitis.  There is no appreciable nodule there but discussed with patient that this could be the beginning of erythema nodosum but unlikely as he has no other lesions.  Recommended monitoring for other lesions developing in the next several days.  No sign of gout, compartment syndrome, septic arthritis, arterial obstruction today.  X-ray obtained shows no bony lesion, fracture.  He is able to ambulate here without difficulty.  Given Tylenol for pain control.  Recommended that he continue alternating Tylenol and Motrin for pain, keep leg elevated, apply ice as needed.  I doubt that there is any life-threatening illness present today.  Discussed with patient that this looks like a soft tissue contusion.  We did discuss at length return precautions.  Patient is comfortable with this plan.  Given outpatient follow-up information.  At this time, I do not feel there is any life-threatening condition present. I have reviewed and discussed all results (EKG, imaging, lab, urine as appropriate) and exam findings with patient/family. I have reviewed nursing notes and appropriate previous records.  I feel the patient is safe to be discharged home without further emergent workup and can continue workup as an outpatient as needed. Discussed usual and customary return precautions. Patient/family verbalize understanding and are comfortable with this plan.  Outpatient follow-up has been provided as needed. All questions have been answered.      , Delice Bison, DO 05/18/19 [phone]

## 2019-05-17 NOTE — ED Triage Notes (Signed)
Patient reports mild left lower shin swelling for 2 days , denies injury , ambulatory, respirations unlabored .

## 2019-05-18 ENCOUNTER — Emergency Department (HOSPITAL_COMMUNITY): Payer: Self-pay

## 2019-05-18 NOTE — Discharge Instructions (Signed)
You may alternate Tylenol 1000 mg every 6 hours as needed for pain and Ibuprofen 800 mg every 8 hours as needed for pain.  Please take Ibuprofen with food.  Your blood work and x-ray today were normal.  If you develop pain or swelling in your calf, thigh, chest pain or shortness of breath, fever, please return to the emergency department.   Steps to find a Primary Care Provider (PCP):  Call [phone] or [phone] to access "[facility]."  2.  You may also go on the [facility] website at [email or web address]  3.  [facility] also frequently accepts new patients.  [place] [phone]  4.  There are also multiple [facility], [facility] and [facility] practices throughout the [place] that are frequently accepting new patients. You may find a clinic that is close to your home and contact them.  [email or web address] [phone]  [facility] [email or web address]  [email or web address] [place] [email or web address] [phone]  5.  Local Health Departments also can provide primary care services.  [facility]  [place] [address] [phone]  [facility] [place] [address] [place] [facility] [place] [phone]

## 2019-08-03 ENCOUNTER — Other Ambulatory Visit: Payer: Self-pay | Admitting: Cardiology

## 2019-08-03 DIAGNOSIS — Z20822 Contact with and (suspected) exposure to covid-19: Secondary | ICD-10-CM

## 2019-08-04 LAB — NOVEL CORONAVIRUS, NAA: SARS-CoV-2, NAA: NOT DETECTED

## 2020-01-19 ENCOUNTER — Other Ambulatory Visit: Payer: Self-pay

## 2020-01-19 ENCOUNTER — Emergency Department (HOSPITAL_COMMUNITY)
Admission: EM | Admit: 2020-01-19 | Discharge: 2020-01-19 | Disposition: A | Discharge status: Home / Self Care | Payer: Self-pay | Attending: Emergency Medicine | Admitting: Emergency Medicine

## 2020-01-19 DIAGNOSIS — L237 Allergic contact dermatitis due to plants, except food: Secondary | ICD-10-CM | POA: Insufficient documentation

## 2020-01-19 DIAGNOSIS — Z87891 Personal history of nicotine dependence: Secondary | ICD-10-CM | POA: Insufficient documentation

## 2020-01-19 DIAGNOSIS — J45909 Unspecified asthma, uncomplicated: Secondary | ICD-10-CM | POA: Insufficient documentation

## 2020-01-19 MED ORDER — PREDNISONE 10 MG PO TABS: ORAL_TABLET | ORAL | 0 refills | Status: DC

## 2020-01-19 NOTE — ED Provider Notes (Signed)
[facility] EMERGENCY DEPARTMENT Provider Note   CSN: [id number] Arrival date & time: 01/19/20  2100     History Chief Complaint  Patient presents with  . Poison Karlene Einstein    Zachary [name] is a 59 y.o. male with a history of asthma who presents to the ED with complaints of rash that is been progressively worsening over the past 3 to 4 days.  Patient states the rashes to his neck/upper trunk, it is pruritic, nonpainful.  No alleviating or aggravating factors.  Has tried OTC creams without relief.  He states that he was in poison ivy around time of onset of symptoms.  He states this feels similar to prior poison ivy requiring steroids.  He denies fever, chills, trouble breathing, trouble swallowing, wheezing, nausea, vomiting, or recent tick bite.  HPI     Past Medical History:  Diagnosis Date  . Asthma   . Knee pain     There are no problems to display for this patient.   Past Surgical History:  Procedure Laterality Date  . KNEE ARTHROSCOPY     x 2       No family history on file.  Social History   Tobacco Use  . Smoking status: Former Research scientist (life sciences)  . Smokeless tobacco: Never Used  Substance Use Topics  . Alcohol use: No  . Drug use: No    Home Medications Prior to Admission medications   Medication Sig Start Date End Date Taking? Authorizing Provider  albuterol (PROVENTIL HFA;VENTOLIN HFA) 108 (90 BASE) MCG/ACT inhaler Inhale 2 puffs into the lungs every 2 (two) hours as needed for wheezing or shortness of breath (cough). 02/10/15   Street, [place], PA-C  clotrimazole (LOTRIMIN) 1 % cream Apply to affected area 2 times daily Patient not taking: Reported on 01/09/2019 11/24/18   Gareth Morgan, MD  famotidine (PEPCID) 20 MG tablet Take 1 tablet (20 mg total) by mouth 2 (two) times daily. Patient not taking: Reported on 01/09/2019 06/12/18   Orpah Greek, MD  fluticasone [facility]) 50 MCG/ACT nasal spray Place 2 sprays into both nostrils  daily. Patient not taking: Reported on 01/09/2019 11/11/17   Carmin Muskrat, MD  ibuprofen (ADVIL) 200 MG tablet Take 400 mg by mouth every 6 (six) hours as needed for headache.    [provider]  meclizine (ANTIVERT) 25 MG tablet Take 1 tablet (25 mg total) by mouth 3 (three) times daily as needed for dizziness. 01/09/19   Alveria Apley, PA-C    Allergies    Patient has no known allergies.  Review of Systems   Review of Systems  Constitutional: Negative for chills and fever.  HENT: Negative for sore throat and trouble swallowing.   Respiratory: Negative for cough, shortness of breath and wheezing.   Cardiovascular: Negative for chest pain.  Gastrointestinal: Negative for diarrhea, nausea and vomiting.  Musculoskeletal: Negative for myalgias.  Skin: Positive for rash.    Physical Exam Updated Vital Signs BP 120/81 (BP Location: Right Arm)   Pulse 84   Temp 98.1 F (36.7 C) (Oral)   Resp 16   Ht 5\' 6"  (1.676 m)   Wt 70.8 kg   SpO2 100%   BMI 25.18 kg/m   Physical Exam Vitals and nursing note reviewed.  Constitutional:      General: He is not in acute distress.    Appearance: He is well-developed. He is not toxic-appearing.  HENT:     Head: Normocephalic and atraumatic.  Mouth/Throat:     Comments: Posterior oropharynx is symmetric appearing. Patient tolerating own secretions without difficulty. No trismus. No drooling. No hot potato voice. No swelling beneath the tongue, submandibular compartment is soft.  No angioedema noted. Eyes:     General:        Right eye: No discharge.        Left eye: No discharge.     Conjunctiva/sclera: Conjunctivae normal.  Cardiovascular:     Rate and Rhythm: Normal rate and regular rhythm.  Pulmonary:     Effort: Pulmonary effort is normal. No respiratory distress.     Breath sounds: Normal breath sounds. No stridor. No wheezing, rhonchi or rales.  Abdominal:     General: There is no distension.     Palpations: Abdomen  is soft.     Tenderness: There is no abdominal tenderness.  Musculoskeletal:     Cervical back: Neck supple.  Skin:    General: Skin is warm and dry.     Findings: Rash (Erythematous papular rash noted to the generalized neck with linear configurations in some locations.) present. No abscess, bruising, ecchymosis or petechiae. Rash is not purpuric or urticarial.  Neurological:     Mental Status: He is alert.     Comments: Clear speech.   Psychiatric:        Behavior: Behavior normal.     ED Results / Procedures / Treatments   Labs (all labs ordered are listed, but only abnormal results are displayed) Labs Reviewed - No data to display  EKG None  Radiology No results found.  Procedures Procedures (including critical care time)  Medications Ordered in ED Medications - No data to display  ED Course  I have reviewed the triage vital signs and the nursing notes.  Pertinent labs & imaging results that were available during my care of the patient were reviewed by me and considered in my medical decision making (see chart for details).    MDM Rules/Calculators/A&P                      Patient presents to the emergency department with history and physical exam consistent with poison ivy contact dermatitis.  He is nontoxic, resting comfortably, vitals WNL.  No signs of angioedema or anaphylaxis.  No palm/sole involvement to raise concern for syphilis or RMSF.  Does not appear to have superimposed infection.  Will treat with prednisone taper as patient has been trying topical steroids without relief, discussed continued OTC antihistamines and OTC topical steroid. I discussed treatment plan, need for follow-up, and return precautions with the patient. Provided opportunity for questions, patient confirmed understanding and is in agreement with plan.   Final Clinical Impression(s) / ED Diagnoses Final diagnoses:  Contact dermatitis due to poison ivy    Rx / DC Orders ED Discharge  Orders         Ordered    predniSONE (DELTASONE) 10 MG tablet     01/19/20 2222           Amaryllis Dyke, PA-C 01/19/20 2226    Drenda Freeze, MD 01/20/20 [phone]

## 2020-01-19 NOTE — ED Triage Notes (Signed)
Pt in POV, presents with rash to neck - states he was working in yard when he came into contact with poison ivy.

## 2020-01-19 NOTE — Discharge Instructions (Signed)
You were seen in the emergency department today for rash.  We are treating you for poison ivy contact dermatitis with steroids.  Please take 40 mg (4 tablets) for day 1 through 5, then take 20 mg (2 tablets) for days 6 through 10.  Please continue to use topical steroids as well as Benadryl per over-the-counter dosing to help with itching and rash.  We have prescribed you new medication(s) today. Discuss the medications prescribed today with your pharmacist as they can have adverse effects and interactions with your other medicines including over the counter and prescribed medications. Seek medical evaluation if you start to experience new or abnormal symptoms after taking one of these medicines, seek care immediately if you start to experience difficulty breathing, feeling of your throat closing, facial swelling, or rash as these could be indications of a more serious allergic reaction  Follow-up with your primary care provider within 1 week.  Return to the ER for new or worsening symptoms including but not limited to worsening rash, pain to rash area, fever, trouble breathing, feeling of throat closing, trouble swallowing, or any other concerns.

## 2020-01-26 ENCOUNTER — Other Ambulatory Visit: Payer: Self-pay

## 2020-01-26 ENCOUNTER — Emergency Department (HOSPITAL_COMMUNITY)
Admission: EM | Admit: 2020-01-26 | Discharge: 2020-01-26 | Disposition: A | Discharge status: Home / Self Care | Payer: Self-pay | Attending: Emergency Medicine | Admitting: Emergency Medicine

## 2020-01-26 ENCOUNTER — Encounter (HOSPITAL_COMMUNITY): Payer: Self-pay | Admitting: Emergency Medicine

## 2020-01-26 ENCOUNTER — Emergency Department (HOSPITAL_COMMUNITY): Payer: Self-pay

## 2020-01-26 DIAGNOSIS — R0789 Other chest pain: Secondary | ICD-10-CM | POA: Insufficient documentation

## 2020-01-26 DIAGNOSIS — J45909 Unspecified asthma, uncomplicated: Secondary | ICD-10-CM | POA: Insufficient documentation

## 2020-01-26 DIAGNOSIS — Z87891 Personal history of nicotine dependence: Secondary | ICD-10-CM | POA: Insufficient documentation

## 2020-01-26 DIAGNOSIS — Z79899 Other long term (current) drug therapy: Secondary | ICD-10-CM | POA: Insufficient documentation

## 2020-01-26 LAB — TROPONIN I (HIGH SENSITIVITY)
Troponin I (High Sensitivity): 3 ng/L (ref <18)
Troponin I (High Sensitivity): 2 ng/L (ref <18)

## 2020-01-26 LAB — BASIC METABOLIC PANEL
Anion gap: 10 (ref 5–15)
BUN: 17 mg/dL (ref 6–20)
CO2: 27 mmol/L (ref 22–32)
Calcium: 9.1 mg/dL (ref 8.9–10.3)
Chloride: 102 mmol/L (ref 98–111)
Creatinine, Ser: 0.86 mg/dL (ref 0.61–1.24)
GFR calc Af Amer: >60 mL/min (ref >60)
GFR calc non Af Amer: >60 mL/min (ref >60)
Glucose, Bld: 112 mg/dL — ABNORMAL HIGH (ref 70–99)
Potassium: 4 mmol/L (ref 3.5–5.1)
Sodium: 139 mmol/L (ref 135–145)

## 2020-01-26 LAB — CBC
HCT: 42.4 % (ref 39.0–52.0)
Hemoglobin: 14.3 g/dL (ref 13.0–17.0)
MCH: 30.4 pg (ref 26.0–34.0)
MCHC: 33.7 g/dL (ref 30.0–36.0)
MCV: 90.2 fL (ref 80.0–100.0)
Platelets: 239 10*3/uL (ref 150–400)
RBC: 4.7 MIL/uL (ref 4.22–5.81)
RDW: 12.7 % (ref 11.5–15.5)
WBC: 4 10*3/uL (ref 4.0–10.5)
nRBC: 0 % (ref 0.0–0.2)

## 2020-01-26 MED ORDER — SODIUM CHLORIDE 0.9% FLUSH: 3.0000 mL | Freq: Once | INTRAVENOUS | Status: DC

## 2020-01-26 MED ORDER — LIDOCAINE VISCOUS HCL 2 % MT SOLN
15.0000 mL | Freq: Once | OROMUCOSAL | Status: AC
  Administered 2020-01-26: 15 mL via ORAL
  Filled 2020-01-26: qty 15

## 2020-01-26 MED ORDER — PANTOPRAZOLE SODIUM 20 MG PO TBEC
20.0000 mg | DELAYED_RELEASE_TABLET | Freq: Every day | ORAL | 1 refills | Status: DC
Start: 2020-01-26 — End: 2020-03-17

## 2020-01-26 MED ORDER — ALUM & MAG HYDROXIDE-SIMETH 200-200-20 MG/5ML PO SUSP
30.0000 mL | Freq: Once | ORAL | Status: AC
  Administered 2020-01-26: 30 mL via ORAL
  Filled 2020-01-26: qty 30

## 2020-01-26 NOTE — Discharge Instructions (Addendum)
Per our discussion, I have given you the contact information for [facility] community health and wellness.  I would recommend you reach out to them regarding this visit.  I am prescribing you a new medication called Protonix.  You can take this once a day for reduction in stomach acid production.   Please cut back on the amount of greasy foods you are eating.  Also try not to eat as close to bedtime.  If you have new or worsening symptoms please do not hesitate to return to the emergency department.  It was a pleasure to meet you.

## 2020-01-26 NOTE — ED Triage Notes (Signed)
Pt reports left chest pains that been constant since yesterday but got worse earlier today.

## 2020-01-26 NOTE — ED Notes (Signed)
An After Visit Summary was printed and given to the patient. Discharge instructions given and no further questions at this time.  

## 2020-01-26 NOTE — ED Provider Notes (Signed)
[facility] DEPT Provider Note   CSN: [id number] Arrival date & time: 01/26/20  [id number]     History Chief Complaint  Patient presents with  . Chest Pain    Zachary [name] is a 59 y.o. male.  HPI HPI Comments: Zachary [name] is a 59 y.o. male who presents to the Emergency Department complaining of chest pain.  Patient states that he started experiencing diffuse left-sided chest pain around 7:30 PM last night.  Just before this he ate fried chicken.  He was driving when his pain started.  It is been constant.  He states it started at about a 7/10 and is a burning/pressure.  He states his current pain is about 9/10.  He does note a history of GERD.  He took some ibuprofen this morning without relief.  He cannot think of any modifying factors.  He was a smoker but quit about 20 years ago.  No alcohol.  No drug use whatsoever.  He denies fevers, chills, URI symptoms, shortness of breath, abdominal pain, nausea, vomiting, diarrhea, GU complaints, syncope.     Past Medical History:  Diagnosis Date  . Asthma   . Knee pain     There are no problems to display for this patient.   Past Surgical History:  Procedure Laterality Date  . KNEE ARTHROSCOPY     x 2       No family history on file.  Social History   Tobacco Use  . Smoking status: Former Research scientist (life sciences)  . Smokeless tobacco: Never Used  Substance Use Topics  . Alcohol use: No  . Drug use: No    Home Medications Prior to Admission medications   Medication Sig Start Date End Date Taking? Authorizing Provider  albuterol (PROVENTIL HFA;VENTOLIN HFA) 108 (90 BASE) MCG/ACT inhaler Inhale 2 puffs into the lungs every 2 (two) hours as needed for wheezing or shortness of breath (cough). 02/10/15   Street, [place], PA-C  ibuprofen (ADVIL) 200 MG tablet Take 400 mg by mouth every 6 (six) hours as needed for headache.    [provider]  meclizine (ANTIVERT) 25 MG tablet Take 1 tablet (25 mg  total) by mouth 3 (three) times daily as needed for dizziness. 01/09/19   Alveria Apley, PA-C  predniSONE (DELTASONE) 10 MG tablet Take 40 mg (4 tablets) by mouth for day 1-5 then 20 mg (2 tablets) for day 6-10. 01/19/20   Petrucelli, Samantha R, PA-C  famotidine (PEPCID) 20 MG tablet Take 1 tablet (20 mg total) by mouth 2 (two) times daily. Patient not taking: Reported on 01/09/2019 06/12/18 01/19/20  Orpah Greek, MD  fluticasone [facility]) 50 MCG/ACT nasal spray Place 2 sprays into both nostrils daily. Patient not taking: Reported on 01/09/2019 11/11/17 01/19/20  Carmin Muskrat, MD    Allergies    Patient has no known allergies.  Review of Systems   Review of Systems  All other systems reviewed and are negative. Ten systems reviewed and are negative for acute change, except as noted in the HPI.   Physical Exam Updated Vital Signs BP 121/80   Pulse 67   Temp 97.9 F (36.6 C) (Oral)   Resp 15   SpO2 98%   Physical Exam Vitals and nursing note reviewed.  Constitutional:      Appearance: He is well-developed and normal weight. He is not ill-appearing, toxic-appearing or diaphoretic.  HENT:     Head: Normocephalic and atraumatic.  Eyes:     Extraocular Movements:  Extraocular movements intact.     Pupils: Pupils are equal, round, and reactive to light.  Cardiovascular:     Rate and Rhythm: Normal rate and regular rhythm.     Pulses:          Radial pulses are 2+ on the right side and 2+ on the left side.       Dorsalis pedis pulses are 2+ on the right side and 2+ on the left side.     Heart sounds: Normal heart sounds. Heart sounds not distant. No murmur. No systolic murmur. No diastolic murmur. No friction rub. No gallop. No S3 or S4 sounds.      Comments: Diffuse TTP noted along the left anterior chest wall as well as left shoulder.  No erythema or edema noted.  No visible signs of trauma.  No crepitus. Pulmonary:     Effort: Pulmonary effort is normal. No tachypnea,  accessory muscle usage or respiratory distress.     Breath sounds: Normal breath sounds. No stridor. No decreased breath sounds, wheezing, rhonchi or rales.     Comments: Lungs are clear to auscultation bilaterally.  Symmetrical rise and fall the chest. Chest:     Chest wall: Tenderness present.  Abdominal:     Palpations: Abdomen is soft.     Tenderness: There is no abdominal tenderness.  Musculoskeletal:        General: Normal range of motion.     Cervical back: Normal range of motion and neck supple.     Right lower leg: No tenderness. No edema.     Left lower leg: No tenderness. No edema.     Comments: Full range of motion of the bilateral upper extremities.  2+ radial pulses.  Distal sensation intact.  Grip strength is 5 out of 5 bilaterally.  Skin:    General: Skin is warm and dry.     Capillary Refill: Capillary refill takes less than 2 seconds.  Neurological:     General: No focal deficit present.     Mental Status: He is alert and oriented to person, place, and time.  Psychiatric:        Mood and Affect: Mood normal.        Behavior: Behavior normal.    ED Results / Procedures / Treatments   Labs (all labs ordered are listed, but only abnormal results are displayed) Labs Reviewed  BASIC METABOLIC PANEL - Abnormal; Notable for the following components:      Result Value   Glucose, Bld 112 (*)    All other components within normal limits  CBC  TROPONIN I (HIGH SENSITIVITY)  TROPONIN I (HIGH SENSITIVITY)    EKG None  Radiology DG Chest 2 View  Result Date: 01/26/2020 CLINICAL DATA:  Chest pain. EXAM: CHEST - 2 VIEW COMPARISON:  January 09, 2019. FINDINGS: The heart size and mediastinal contours are within normal limits. Both lungs are clear. No pneumothorax or pleural effusion is noted. The visualized skeletal structures are unremarkable. IMPRESSION: No active cardiopulmonary disease. Electronically Signed   By: Marijo Conception M.D.   On: 01/26/2020 09:54    Procedures Procedures (including critical care time)  Medications Ordered in ED Medications  sodium chloride flush (NS) 0.9 % injection 3 mL (has no administration in time range)    ED Course  I have reviewed the triage vital signs and the nursing notes.  Pertinent labs & imaging results that were available during my care of the patient were reviewed by me  and considered in my medical decision making (see chart for details).    MDM Rules/Calculators/A&P                      12:52 PM patient is a pleasant 59 year old male who presents with constant left-sided worsening chest pain since last night.  He ate fried chicken just prior to the onset of his symptoms.  No modifying factors.  Ibuprofen without relief.  Initial labs reassuring.  He is not tachycardic.  Not hypertensive.  He is oxygenating well.  Slightly hyperglycemic at 112.  Negative troponin.  X-ray of the chest is negative.  ECG is consistent with prior.  Patient has reproducible pain in the left anterior chest wall as well as left shoulder.  He does have a history of GERD but is unsure if his symptoms are consistent.  Will give patient a GI cocktail.  Will obtain a second troponin.  Will reassess.  2:57 PM patient reports some mild relief with the GI cocktail.  His second troponin is negative.  His heart score is a 3.  I discussed this patient with my attending physician Dr. Milton Ferguson and he feels comfortable with patient being discharged with strict PCP follow up.  His wells score for PE rule out is 0.  Besides age, PERC criteria is negative.  I discussed the patient's lab values with him in detail.  I am going to prescribe him a PPI to start taking once a day.  He does not have a primary care provider or insurance.  We will give him contact information for [facility] health and wellness.  I recommended that he reach out to them as soon as possible to discuss his symptoms as well as this visit.  He was given very strict return  precautions.  He knows to return to the emergency department if his symptoms worsen.  His questions were answered and he was amicable at the time of discharge.  His vital signs are stable.  Patient discharged to home/self care.  Condition at discharge: Stable  Note: Portions of this report may have been transcribed using voice recognition software. Every effort was made to ensure accuracy; however, inadvertent computerized transcription errors may be present.    Final Clinical Impression(s) / ED Diagnoses Final diagnoses:  Chest wall pain    Rx / DC Orders ED Discharge Orders         Ordered    pantoprazole (PROTONIX) 20 MG tablet  Daily     01/26/20 1505           Rayna Sexton, PA-C 01/26/20 1511    Milton Ferguson, MD 01/27/20 1423

## 2020-02-26 ENCOUNTER — Inpatient Hospital Stay: Payer: Self-pay | Admitting: Critical Care Medicine

## 2020-03-03 ENCOUNTER — Inpatient Hospital Stay: Payer: Self-pay | Admitting: Critical Care Medicine

## 2020-03-17 ENCOUNTER — Other Ambulatory Visit: Payer: Self-pay

## 2020-03-17 ENCOUNTER — Encounter: Payer: Self-pay | Admitting: Critical Care Medicine

## 2020-03-17 ENCOUNTER — Ambulatory Visit: Payer: Self-pay | Attending: Critical Care Medicine | Admitting: Critical Care Medicine

## 2020-03-17 VITALS — BP 124/88 | HR 75 | Temp 97.6°F | Resp 16 | Ht 66.0 in | Wt 142.0 lb

## 2020-03-17 DIAGNOSIS — Z1211 Encounter for screening for malignant neoplasm of colon: Secondary | ICD-10-CM

## 2020-03-17 DIAGNOSIS — Z1159 Encounter for screening for other viral diseases: Secondary | ICD-10-CM

## 2020-03-17 DIAGNOSIS — Z114 Encounter for screening for human immunodeficiency virus [HIV]: Secondary | ICD-10-CM

## 2020-03-17 DIAGNOSIS — J4520 Mild intermittent asthma, uncomplicated: Secondary | ICD-10-CM

## 2020-03-17 DIAGNOSIS — K056 Periodontal disease, unspecified: Secondary | ICD-10-CM

## 2020-03-17 DIAGNOSIS — K4090 Unilateral inguinal hernia, without obstruction or gangrene, not specified as recurrent: Secondary | ICD-10-CM | POA: Insufficient documentation

## 2020-03-17 DIAGNOSIS — G8929 Other chronic pain: Secondary | ICD-10-CM

## 2020-03-17 DIAGNOSIS — K029 Dental caries, unspecified: Secondary | ICD-10-CM

## 2020-03-17 DIAGNOSIS — M25561 Pain in right knee: Secondary | ICD-10-CM

## 2020-03-17 DIAGNOSIS — K219 Gastro-esophageal reflux disease without esophagitis: Secondary | ICD-10-CM | POA: Insufficient documentation

## 2020-03-17 MED ORDER — ALBUTEROL SULFATE HFA 108 (90 BASE) MCG/ACT IN AERS: 2.0000 | INHALATION_SPRAY | RESPIRATORY_TRACT | 0 refills | Status: DC | PRN

## 2020-03-17 MED ORDER — PANTOPRAZOLE SODIUM 20 MG PO TBEC: 20.0000 mg | DELAYED_RELEASE_TABLET | Freq: Every day | ORAL | 1 refills | Status: DC

## 2020-03-17 MED FILL — ALBUTEROL SULFATE HFA 108 (: 108 (90 BAS | 25 days supply | Qty: 18 | Fill #0

## 2020-03-17 MED FILL — PANTOPRAZOLE SOD DR 20 MG T: 20 | 30 days supply | Qty: 30 | Fill #0

## 2020-03-17 NOTE — Assessment & Plan Note (Signed)
Chronic right inguinal hernia will observe for now

## 2020-03-17 NOTE — Assessment & Plan Note (Signed)
Significant dental caries will refer this patient to dentist for further tooth extractions

## 2020-03-17 NOTE — Assessment & Plan Note (Signed)
As per dental caries assessment 

## 2020-03-17 NOTE — Progress Notes (Signed)
Subjective:    Patient ID: Zachary [name], male    DOB: 12-22-1960, 59 y.o.   MRN: [id number]  This is a pleasant 59 year old male who had been seen in the emergency room for chest discomfort in late May.  Work-up for cardiac disorder was negative.  Chest x-ray negative.  Patient was given prescription for Protonix and he states this is resolved his chest pain.  He also has history of mild intermittent asthma and had an albuterol inhaler in the past but no longer has this.  Patient also complains of a right inguinal hernia but does not have current pain from this.  No prior history of known hypertension or diabetes.  No other significant complaints at this time.  Patient does have prior history of right knee pain with prior arthroscopy knee pain is tolerable at this moment.  Patient does have dental caries and has remaining teeth in the lower front jaw and the left posterior jaw that need to be removed other teeth been extracted  Past Medical History:  Diagnosis Date  . Asthma   . Knee pain      Family History  Problem Relation Age of Onset  . Hypertension Mother   . Cancer Father   . Cancer Other      Social History   Socioeconomic History  . Marital status: Single    Spouse name: Not on file  . Number of children: Not on file  . Years of education: Not on file  . Highest education level: Not on file  Occupational History  . Not on file  Tobacco Use  . Smoking status: Former Research scientist (life sciences)  . Smokeless tobacco: Never Used  Vaping Use  . Vaping Use: Never used  Substance and Sexual Activity  . Alcohol use: No  . Drug use: No  . Sexual activity: Yes  Other Topics Concern  . Not on file  Social History Narrative  . Not on file   Social Determinants of Health   Financial Resource Strain:   . Difficulty of Paying Living Expenses:   Food Insecurity:   . Worried About Charity fundraiser in the Last Year:   . Arboriculturist in the Last Year:   Transportation Needs:   .  Film/video editor (Medical):   Marland Kitchen Lack of Transportation (Non-Medical):   Physical Activity:   . Days of Exercise per Week:   . Minutes of Exercise per Session:   Stress:   . Feeling of Stress :   Social Connections:   . Frequency of Communication with Friends and Family:   . Frequency of Social Gatherings with Friends and Family:   . Attends Religious Services:   . Active Member of Clubs or Organizations:   . Attends Archivist Meetings:   Marland Kitchen Marital Status:   Intimate Partner Violence:   . Fear of Current or Ex-Partner:   . Emotionally Abused:   Marland Kitchen Physically Abused:   . Sexually Abused:      No Known Allergies   Outpatient Medications Prior to Visit  Medication Sig Dispense Refill  . pantoprazole (PROTONIX) 20 MG tablet Take 1 tablet (20 mg total) by mouth daily. 30 tablet 1  . albuterol (PROVENTIL HFA;VENTOLIN HFA) 108 (90 BASE) MCG/ACT inhaler Inhale 2 puffs into the lungs every 2 (two) hours as needed for wheezing or shortness of breath (cough). (Patient not taking: Reported on 03/17/2020) 1 Inhaler 0  . meclizine (ANTIVERT) 25 MG tablet Take 1 tablet (  25 mg total) by mouth 3 (three) times daily as needed for dizziness. (Patient not taking: Reported on 01/26/2020) 30 tablet 0  . predniSONE (DELTASONE) 10 MG tablet Take 40 mg (4 tablets) by mouth for day 1-5 then 20 mg (2 tablets) for day 6-10. (Patient not taking: Reported on 01/26/2020) 30 tablet 0   No facility-administered medications prior to visit.       Review of Systems  Constitutional: Negative for fever.  HENT: Negative for dental problem, ear discharge, ear pain, postnasal drip, rhinorrhea, sinus pressure, sinus pain, sneezing, sore throat and trouble swallowing.   Eyes: Negative.   Respiratory: Positive for cough and chest tightness. Negative for choking, shortness of breath, wheezing and stridor.   Cardiovascular: Negative for chest pain.  Gastrointestinal: Negative.   Endocrine: Negative for  polydipsia, polyphagia and polyuria.  Genitourinary: Negative.   Musculoskeletal: Negative for arthralgias, back pain, gait problem, joint swelling, myalgias, neck pain and neck stiffness.       Right knee surgery , chronic pain R knee  Skin: Negative.   Neurological: Negative.   Hematological: Negative.   Psychiatric/Behavioral: Negative.        Objective:   Physical Exam Vitals:   03/17/20 1007  BP: 124/88  Pulse: 75  Resp: 16  Temp: 97.6 F (36.4 C)  SpO2: 100%  Weight: 142 lb (64.4 kg)  Height: 5\' 6"  (1.676 m)    Gen: Pleasant, well-nourished, in no distress,  normal affect  ENT: Poor dentition is seen in the lower jaw area,  mouth clear,  oropharynx clear, no postnasal drip  Neck: No JVD, no TMG, no carotid bruits  Lungs: No use of accessory muscles, no dullness to percussion, clear without rales or rhonchi  Cardiovascular: RRR, heart sounds normal, no murmur or gallops, no peripheral edema  Abdomen: soft and NT, no HSM,  BS normal  Musculoskeletal: No deformities, no cyanosis or clubbing  Neuro: alert, non focal  Skin: Warm, no lesions or rashes  All results from the emergency room visit are reviewed     Assessment & Plan:  I personally reviewed all images and lab data in the [facility] system as well as any outside material available during this office visit and agree with the  radiology impressions.   Mild intermittent asthma without complication By history the patient has mild intermittent asthma  Plan will be to give albuterol as needed only and observe  Dental caries Significant dental caries will refer this patient to dentist for further tooth extractions  Periodontal disease As per dental caries assessment  Gastroesophageal reflux disease without esophagitis Reflux disease causing chest pain we will continue proton pump inhibitor and give reflux diet  Inguinal hernia of right side without obstruction or gangrene Chronic right inguinal hernia will  observe for now   Diagnoses and all orders for this visit:  Gastroesophageal reflux disease without esophagitis  Need for hepatitis C screening test -     Hepatitis C antibody  Encounter for screening for HIV -     HIV Antibody (routine testing w rflx)  Colon cancer screening -     Fecal occult blood, imunochemical  Mild intermittent asthma without complication  Periodontal disease  Dental caries  Inguinal hernia of right side without obstruction or gangrene  Chronic pain of right knee  Other orders -     albuterol (VENTOLIN HFA) 108 (90 Base) MCG/ACT inhaler; Inhale 2 puffs into the lungs every 2 (two) hours as needed for wheezing or shortness of  breath (cough). -     pantoprazole (PROTONIX) 20 MG tablet; Take 1 tablet (20 mg total) by mouth daily.   The patient received 1 dose of Covid vaccine will await for the second dose before we administer tetanus shot at a later visit  Fecal occult card was given for cancer screening  Hepatitis C HIV studies will be obtained

## 2020-03-17 NOTE — Assessment & Plan Note (Signed)
Reflux disease causing chest pain we will continue proton pump inhibitor and give reflux diet

## 2020-03-17 NOTE — Assessment & Plan Note (Signed)
By history the patient has mild intermittent asthma  Plan will be to give albuterol as needed only and observe

## 2020-03-17 NOTE — Progress Notes (Signed)
Hospital F/u  

## 2020-03-17 NOTE — Patient Instructions (Signed)
Stay on protonix daily for acid symptoms, refills sent to our pharmacy Follow reflux diet as below Albuterol inhaler as needed for shortness of breath was sent Labs today: HIV , Hepatitis C level, a stool kit will be given to screen for colon cancer  Please remember to get your second covid vaccine  A referral to [facility] was made to get your final teeth extracted  Please let us know if your hernia develops further and you experience pain   Return Dr Joya [name] 4 months   Inguinal Hernia, Adult An inguinal hernia is when fat or your intestines push through a weak spot in a muscle where your leg meets your lower belly (groin). This causes a rounded lump (bulge). This kind of hernia could also be:  In your scrotum, if you are male.  In folds of skin around your vagina, if you are male. There are three types of inguinal hernias. These include:  Hernias that can be pushed back into the belly (are reducible). This type rarely causes pain.  Hernias that cannot be pushed back into the belly (are incarcerated).  Hernias that cannot be pushed back into the belly and lose their blood supply (are strangulated). This type needs emergency surgery. If you do not have symptoms, you may not need treatment. If you have symptoms or a large hernia, you may need surgery. Follow these instructions at home: Lifestyle  Do these things if told by your doctor so you do not have trouble pooping (constipation): ? Drink enough fluid to keep your pee (urine) pale yellow. ? Eat foods that have a lot of fiber. These include fresh fruits and vegetables, whole grains, and beans. ? Limit foods that are high in fat and processed sugars. These include foods that are fried or sweet. ? Take medicine for trouble pooping.  Avoid lifting heavy objects.  Avoid standing for long amounts of time.  Do not use any products that contain nicotine or tobacco. These include cigarettes and e-cigarettes. If you need help  quitting, ask your doctor.  Stay at a healthy weight. General instructions  You may try to push your hernia in by very gently pressing on it when you are lying down. Do not try to force the bulge back in if it will not push in easily.  Watch your hernia for any changes in shape, size, or color. Tell your doctor if you see any changes.  Take over-the-counter and prescription medicines only as told by your doctor.  Keep all follow-up visits as told by your doctor. This is important. Contact a doctor if:  You have a fever.  You have new symptoms.  Your symptoms get worse. Get help right away if:  The area where your leg meets your lower belly has: ? Pain that gets worse suddenly. ? A bulge that gets bigger suddenly, and it does not get smaller after that. ? A bulge that turns red or purple. ? A bulge that is painful when you touch it.  You are a man, and your scrotum: ? Suddenly feels painful. ? Suddenly changes in size.  You cannot push the hernia in by very gently pressing on it when you are lying down. Do not try to force the bulge back in if it will not push in easily.  You feel sick to your stomach (nauseous), and that feeling does not go away.  You throw up (vomit), and that keeps happening.  You have a fast heartbeat.  You cannot poop (have  a bowel movement) or pass gas. These symptoms may be an emergency. Do not wait to see if the symptoms will go away. Get medical help right away. Call your local emergency services (911 in the [place] Summary  An inguinal hernia is when fat or your intestines push through a weak spot in a muscle where your leg meets your lower belly (groin). This causes a rounded lump (bulge).  If you do not have symptoms, you may not need treatment. If you have symptoms or a large hernia, you may need surgery.  Avoid lifting heavy objects. Also avoid standing for long amounts of time.  Do not try to force the bulge back in if it will not push in  easily. This information is not intended to replace advice given to you by your health care provider. Make sure you discuss any questions you have with your health care provider. Document Revised: 09/29/2017 Document Reviewed: 05/30/2017 Elsevier Patient Education  2020 [place] for Gastroesophageal Reflux Disease, Adult When you have gastroesophageal reflux disease (GERD), the foods you eat and your eating habits are very important. Choosing the right foods can help ease your discomfort. Think about working with a nutrition specialist (dietitian) to help you make good choices. What are tips for following this plan?  Meals  Choose healthy foods that are low in fat, such as fruits, vegetables, whole grains, low-fat dairy products, and lean meat, fish, and poultry.  Eat small meals often instead of 3 large meals a day. Eat your meals slowly, and in a place where you are relaxed. Avoid bending over or lying down until 2-3 hours after eating.  Avoid eating meals 2-3 hours before bed.  Avoid drinking a lot of liquid with meals.  Cook foods using methods other than frying. Bake, grill, or broil food instead.  Avoid or limit: ? Chocolate. ? Peppermint or spearmint. ? Alcohol. ? Pepper. ? Black and decaffeinated coffee. ? Black and decaffeinated tea. ? Bubbly (carbonated) soft drinks. ? Caffeinated energy drinks and soft drinks.  Limit high-fat foods such as: ? Fatty meat or fried foods. ? Whole milk, cream, butter, or ice cream. ? Nuts and nut butters. ? Pastries, donuts, and sweets made with butter or shortening.  Avoid foods that cause symptoms. These foods may be different for everyone. Common foods that cause symptoms include: ? Tomatoes. ? Oranges, lemons, and limes. ? Peppers. ? Spicy food. ? Onions and garlic. ? Vinegar. Lifestyle  Maintain a healthy weight. Ask your doctor what weight is healthy for you. If you need to lose weight, work with your  doctor to do so safely.  Exercise for at least 30 minutes for 5 or more days each week, or as told by your doctor.  Wear loose-fitting clothes.  Do not smoke. If you need help quitting, ask your doctor.  Sleep with the head of your bed higher than your feet. Use a wedge under the mattress or blocks under the bed frame to raise the head of the bed. Summary  When you have gastroesophageal reflux disease (GERD), food and lifestyle choices are very important in easing your symptoms.  Eat small meals often instead of 3 large meals a day. Eat your meals slowly, and in a place where you are relaxed.  Limit high-fat foods such as fatty meat or fried foods.  Avoid bending over or lying down until 2-3 hours after eating.  Avoid peppermint and spearmint, caffeine, alcohol, and chocolate. This information is not  intended to replace advice given to you by your health care provider. Make sure you discuss any questions you have with your health care provider. Document Revised: 12/19/2018 Document Reviewed: 10/03/2016 Elsevier Patient Education  [place].

## 2020-03-18 LAB — HEPATITIS C ANTIBODY: Hep C Virus Ab: 0.1 s/co ratio (ref 0.0–0.9)

## 2020-03-18 LAB — HIV ANTIBODY (ROUTINE TESTING W REFLEX): HIV Screen 4th Generation wRfx: NONREACTIVE

## 2020-03-19 LAB — FECAL OCCULT BLOOD, IMMUNOCHEMICAL: Fecal Occult Bld: NEGATIVE

## 2020-03-22 ENCOUNTER — Telehealth: Payer: Self-pay

## 2020-03-22 NOTE — Telephone Encounter (Signed)
-----   Message from Charlott Rakes, MD sent at 03/22/2020  1:22 PM EDT ----- Please inform him his stool occult test is negative.

## 2020-03-22 NOTE — Telephone Encounter (Signed)
Patient was called and a voicemail was left informing patient to return phone call for lab results   Ok to give results to patient.

## 2020-03-25 ENCOUNTER — Telehealth: Payer: Self-pay

## 2020-03-25 NOTE — Telephone Encounter (Signed)
-----   Message from Charlott Rakes, MD sent at 03/22/2020  1:22 PM EDT ----- Please inform him his stool occult test is negative.

## 2020-03-25 NOTE — Telephone Encounter (Signed)
Patient returned call, states that he was already advised of result.

## 2020-03-25 NOTE — Telephone Encounter (Signed)
Patient was called and a voicemail was left informing patient to return phone call for lab results. 

## 2020-05-18 ENCOUNTER — Other Ambulatory Visit: Payer: Self-pay

## 2020-05-18 ENCOUNTER — Emergency Department (HOSPITAL_COMMUNITY)
Admission: EM | Admit: 2020-05-18 | Discharge: 2020-05-19 | Disposition: A | Discharge status: Home / Self Care | Payer: Self-pay | Attending: Emergency Medicine | Admitting: Emergency Medicine

## 2020-05-18 ENCOUNTER — Encounter (HOSPITAL_COMMUNITY): Payer: Self-pay

## 2020-05-18 DIAGNOSIS — Z79899 Other long term (current) drug therapy: Secondary | ICD-10-CM | POA: Insufficient documentation

## 2020-05-18 DIAGNOSIS — R319 Hematuria, unspecified: Secondary | ICD-10-CM | POA: Insufficient documentation

## 2020-05-18 DIAGNOSIS — A6001 Herpesviral infection of penis: Secondary | ICD-10-CM

## 2020-05-18 DIAGNOSIS — Z87891 Personal history of nicotine dependence: Secondary | ICD-10-CM | POA: Insufficient documentation

## 2020-05-18 DIAGNOSIS — J4520 Mild intermittent asthma, uncomplicated: Secondary | ICD-10-CM | POA: Insufficient documentation

## 2020-05-18 DIAGNOSIS — L089 Local infection of the skin and subcutaneous tissue, unspecified: Secondary | ICD-10-CM

## 2020-05-18 DIAGNOSIS — R21 Rash and other nonspecific skin eruption: Secondary | ICD-10-CM | POA: Insufficient documentation

## 2020-05-18 DIAGNOSIS — Z7951 Long term (current) use of inhaled steroids: Secondary | ICD-10-CM | POA: Insufficient documentation

## 2020-05-18 DIAGNOSIS — R102 Pelvic and perineal pain: Secondary | ICD-10-CM | POA: Insufficient documentation

## 2020-05-18 LAB — COMPREHENSIVE METABOLIC PANEL
ALT: 27 U/L (ref 0–44)
AST: 20 U/L (ref 15–41)
Albumin: 4.2 g/dL (ref 3.5–5.0)
Alkaline Phosphatase: 67 U/L (ref 38–126)
Anion gap: 6 (ref 5–15)
BUN: 8 mg/dL (ref 6–20)
CO2: 29 mmol/L (ref 22–32)
Calcium: 9.6 mg/dL (ref 8.9–10.3)
Chloride: 105 mmol/L (ref 98–111)
Creatinine, Ser: 0.89 mg/dL (ref 0.61–1.24)
GFR calc Af Amer: >60 mL/min (ref >60)
GFR calc non Af Amer: >60 mL/min (ref >60)
Glucose, Bld: 96 mg/dL (ref 70–99)
Potassium: 3.7 mmol/L (ref 3.5–5.1)
Sodium: 140 mmol/L (ref 135–145)
Total Bilirubin: 0.9 mg/dL (ref 0.3–1.2)
Total Protein: 8 g/dL (ref 6.5–8.1)

## 2020-05-18 LAB — URINALYSIS, ROUTINE W REFLEX MICROSCOPIC
Bilirubin Urine: NEGATIVE
Glucose, UA: NEGATIVE mg/dL
Hgb urine dipstick: NEGATIVE
Ketones, ur: NEGATIVE mg/dL
Leukocytes,Ua: NEGATIVE
Nitrite: NEGATIVE
Protein, ur: NEGATIVE mg/dL
Specific Gravity, Urine: 1.029 (ref 1.005–1.030)
pH: 5 (ref 5.0–8.0)

## 2020-05-18 LAB — CBC
HCT: 40.7 % (ref 39.0–52.0)
Hemoglobin: 13.8 g/dL (ref 13.0–17.0)
MCH: 30.6 pg (ref 26.0–34.0)
MCHC: 33.9 g/dL (ref 30.0–36.0)
MCV: 90.2 fL (ref 80.0–100.0)
Platelets: 237 10*3/uL (ref 150–400)
RBC: 4.51 MIL/uL (ref 4.22–5.81)
RDW: 12.6 % (ref 11.5–15.5)
WBC: 4.5 10*3/uL (ref 4.0–10.5)
nRBC: 0 % (ref 0.0–0.2)

## 2020-05-18 LAB — LIPASE, BLOOD: Lipase: 33 U/L (ref 11–51)

## 2020-05-18 NOTE — ED Triage Notes (Signed)
Pt reports genital rash, blood in urine and lower abd pain for the past 2 days, denies n/v

## 2020-05-19 ENCOUNTER — Ambulatory Visit: Payer: Self-pay | Admitting: Critical Care Medicine

## 2020-05-19 ENCOUNTER — Encounter (HOSPITAL_COMMUNITY): Payer: Self-pay | Admitting: Emergency Medicine

## 2020-05-19 MED ORDER — CEPHALEXIN 500 MG PO CAPS
500.0000 mg | ORAL_CAPSULE | Freq: Four times a day (QID) | ORAL | 0 refills | Status: DC
Start: 2020-05-19 — End: 2020-07-02

## 2020-05-19 MED ORDER — ACYCLOVIR 400 MG PO TABS
400.0000 mg | ORAL_TABLET | Freq: Four times a day (QID) | ORAL | 0 refills | Status: DC
Start: 2020-05-19 — End: 2020-07-02

## 2020-05-19 MED ORDER — CEPHALEXIN 250 MG PO CAPS
500.0000 mg | ORAL_CAPSULE | Freq: Once | ORAL | Status: AC
  Administered 2020-05-19: 500 mg via ORAL
  Filled 2020-05-19: qty 2

## 2020-05-19 MED ORDER — ACYCLOVIR 200 MG PO CAPS
400.0000 mg | ORAL_CAPSULE | ORAL | Status: AC
  Administered 2020-05-19: 400 mg via ORAL
  Filled 2020-05-19: qty 2

## 2020-05-19 NOTE — ED Provider Notes (Signed)
[facility] EMERGENCY DEPARTMENT Provider Note   CSN: [id number] Arrival date & time: 05/18/20  1759     History Chief Complaint  Patient presents with  . Abdominal Pain  . Rash  . Hematuria    Zachary [name] is a 59 y.o. male.  The history is provided by the patient.  Rash Location:  Pelvis Pelvic rash location:  Penis Quality: blistering, painful and weeping   Pain details:    Quality:  Aching   Severity:  Moderate   Onset quality:  Gradual   Duration:  3 days   Timing:  Constant   Progression:  Unchanged Severity:  Moderate Onset quality:  Gradual Duration:  3 days Timing:  Constant Progression:  Worsening Chronicity:  New Context comment:  Sex with a partner he hadn't had sex with in a while  Relieved by:  Nothing Worsened by:  Nothing Ineffective treatments:  None tried Associated symptoms: no fatigue, no fever, no headaches, no induration, no myalgias, no nausea, no periorbital edema and no shortness of breath   Patient presents with genitals rash that was blistering and painful and is now weeping in some spots.  He thought it was an abscess that burst and saw a tinge of color on the underwear and wasn't sure what it was.  He has actually not seen seen blood in the urine just spots on his underwear that were discolored but not red like blood.       Past Medical History:  Diagnosis Date  . Asthma   . Knee pain     Patient Active Problem List   Diagnosis Date Noted  . Inguinal hernia of right side without obstruction or gangrene 03/17/2020  . Periodontal disease 03/17/2020  . Dental caries 03/17/2020  . Mild intermittent asthma without complication 81/44/8185  . Gastroesophageal reflux disease without esophagitis 03/17/2020  . Chronic pain of right knee 03/17/2020    Past Surgical History:  Procedure Laterality Date  . KNEE ARTHROSCOPY     x 2       Family History  Problem Relation Age of Onset  . Hypertension Mother   .  Cancer Father   . Cancer Other     Social History   Tobacco Use  . Smoking status: Former Research scientist (life sciences)  . Smokeless tobacco: Never Used  Vaping Use  . Vaping Use: Never used  Substance Use Topics  . Alcohol use: No  . Drug use: No    Home Medications Prior to Admission medications   Medication Sig Start Date End Date Taking? Authorizing Provider  albuterol (VENTOLIN HFA) 108 (90 Base) MCG/ACT inhaler Inhale 2 puffs into the lungs every 2 (two) hours as needed for wheezing or shortness of breath (cough). 03/17/20   Elsie Stain, MD  pantoprazole (PROTONIX) 20 MG tablet Take 1 tablet (20 mg total) by mouth daily. 03/17/20   Elsie Stain, MD  famotidine (PEPCID) 20 MG tablet Take 1 tablet (20 mg total) by mouth 2 (two) times daily. Patient not taking: Reported on 01/09/2019 06/12/18 01/19/20  Orpah Greek, MD  fluticasone [facility]) 50 MCG/ACT nasal spray Place 2 sprays into both nostrils daily. Patient not taking: Reported on 01/09/2019 11/11/17 01/19/20  Carmin Muskrat, MD    Allergies    Patient has no known allergies.  Review of Systems   Review of Systems  Constitutional: Negative for fatigue and fever.  HENT: Negative for congestion.   Respiratory: Negative for shortness of breath.   Gastrointestinal:  Negative for nausea.  Genitourinary: Positive for genital sores and penile pain. Negative for discharge and hematuria.  Musculoskeletal: Negative for myalgias.  Skin: Positive for rash.  Neurological: Negative for headaches.  Psychiatric/Behavioral: Negative for agitation.  All other systems reviewed and are negative.   Physical Exam Updated Vital Signs BP 120/87   Pulse 64   Temp 97.8 F (36.6 C) (Oral)   Resp 18   SpO2 97%   Physical Exam Vitals and nursing note reviewed. Exam conducted with a chaperone present.  Constitutional:      General: He is not in acute distress.    Appearance: Normal appearance.  HENT:     Head: Normocephalic and atraumatic.       Nose: Nose normal.  Eyes:     Conjunctiva/sclera: Conjunctivae normal.     Pupils: Pupils are equal, round, and reactive to light.  Cardiovascular:     Rate and Rhythm: Normal rate and regular rhythm.     Pulses: Normal pulses.     Heart sounds: Normal heart sounds.  Pulmonary:     Effort: Pulmonary effort is normal.     Breath sounds: Normal breath sounds.  Abdominal:     General: Abdomen is flat.     Tenderness: There is no abdominal tenderness. There is no guarding.  Genitourinary:    Comments: Patient has an easily reducible inguinal hernia that he states is old.  He also has clusters of vesicles on the shaft of the penis and onto groin area. 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   Musculoskeletal:        General: Normal range of motion.     Cervical back: Normal range of motion and neck supple.  Skin:    General: Skin is warm and dry.     Capillary Refill: Capillary refill takes less than 2 seconds.  Neurological:     General: No focal deficit present.     Mental Status: He is alert and oriented to person, place, and time.  Psychiatric:        Mood and Affect: Mood normal.        Behavior: Behavior normal.     ED Results / Procedures / Treatments   Labs (all labs ordered are listed, but only abnormal results are displayed) Results for orders placed or performed during the hospital encounter of 05/18/20  Lipase, blood  Result Value Ref Range   Lipase 33 11 - 51 U/L  Comprehensive metabolic panel  Result Value Ref Range   Sodium 140 135 - 145 mmol/L   Potassium 3.7 3.5 - 5.1 mmol/L   Chloride 105 98 - 111 mmol/L   CO2 29 22 - 32 mmol/L   Glucose, Bld 96 70 - 99 mg/dL   BUN 8 6 - 20 mg/dL   Creatinine, Ser 0.89 0.61 - 1.24 mg/dL   Calcium 9.6 8.9 - 10.3 mg/dL   Total Protein 8.0 6.5 - 8.1 g/dL   Albumin 4.2 3.5 - 5.0 g/dL   AST 20  15 - 41 U/L   ALT 27 0 - 44 U/L   Alkaline Phosphatase 67 38 - 126 U/L   Total Bilirubin 0.9 0.3 - 1.2 mg/dL   GFR calc non Af Amer >60 >60 mL/min   GFR calc Af Amer >60 >60 mL/min   Anion gap 6 5 - 15  CBC  Result Value  Ref Range   WBC 4.5 4.0 - 10.5 K/uL   RBC 4.51 4.22 - 5.81 MIL/uL   Hemoglobin 13.8 13.0 - 17.0 g/dL   HCT 40.7 39 - 52 %   MCV 90.2 80.0 - 100.0 fL   MCH 30.6 26.0 - 34.0 pg   MCHC 33.9 30.0 - 36.0 g/dL   RDW 12.6 11.5 - 15.5 %   Platelets 237 150 - 400 K/uL   nRBC 0.0 0.0 - 0.2 %  Urinalysis, Routine w reflex microscopic Urine, Clean Catch  Result Value Ref Range   Color, Urine YELLOW YELLOW   APPearance CLEAR CLEAR   Specific Gravity, Urine 1.029 1.005 - 1.030   pH 5.0 5.0 - 8.0   Glucose, UA NEGATIVE NEGATIVE mg/dL   Hgb urine dipstick NEGATIVE NEGATIVE   Bilirubin Urine NEGATIVE NEGATIVE   Ketones, ur NEGATIVE NEGATIVE mg/dL   Protein, ur NEGATIVE NEGATIVE mg/dL   Nitrite NEGATIVE NEGATIVE   Leukocytes,Ua NEGATIVE NEGATIVE   No results found.  Radiology No results found.  Procedures Procedures (including critical care time)  Medications Ordered in ED Medications  acyclovir (ZOVIRAX) 200 MG capsule 400 mg (has no administration in time range)  cephALEXin (KEFLEX) capsule 500 mg (has no administration in time range)    ED Course  I have reviewed the triage vital signs and the nursing notes.  Pertinent labs & imaging results that were available during my care of the patient were reviewed by me and considered in my medical decision making (see chart for details).    I read the nurses note and appreciate it, but the patient doesn't have hematuria.  There is some purulent drainage from a few of the herpetic lesions.  Will treat for herpes and superimposed infection and have patient follow up with PMD or county health department for ongoing care.    KAIPO ARDIS was evaluated in Emergency Department on 05/19/2020 for the symptoms described in  the history of present illness. He was evaluated in the context of the global COVID-19 pandemic, which necessitated consideration that the patient might be at risk for infection with the SARS-CoV-2 virus that causes COVID-19. Institutional protocols and algorithms that pertain to the evaluation of patients at risk for COVID-19 are in a state of rapid change based on information released by regulatory bodies including the CDC and federal and state organizations. These policies and algorithms were followed during the patient's care in the ED.  Final Clinical Impression(s) / ED Diagnoses Return for intractable cough, coughing up blood,fevers >100.4 unrelieved by medication, shortness of breath, intractable vomiting, chest pain, shortness of breath, weakness,numbness, changes in speech, facial asymmetry,abdominal pain, passing out,Inability to tolerate liquids or food, cough, altered mental status or any concerns. No signs of systemic illness or infection. The patient is nontoxic-appearing on exam and vital signs are within normal limits.   I have reviewed the triage vital signs and the nursing notes. Pertinent labs &imaging results that were available during my care of the patient were reviewed by me and considered in my medical decision making (see chart for details).After history, exam, and medical workup I feel the patient has beenappropriately medically screened and is safe for discharge home. Pertinent diagnoses were discussed with the patient. Patient was given return precautions.   Encarnacion Bole, MD 05/19/20 [phone]

## 2020-05-19 NOTE — ED Notes (Signed)
Patient called x1 for vitals recheck with no response 

## 2020-05-23 ENCOUNTER — Emergency Department (HOSPITAL_COMMUNITY)
Admission: EM | Admit: 2020-05-23 | Discharge: 2020-05-23 | Disposition: A | Discharge status: Home / Self Care | Payer: Self-pay | Attending: Emergency Medicine | Admitting: Emergency Medicine

## 2020-05-23 ENCOUNTER — Encounter (HOSPITAL_COMMUNITY): Payer: Self-pay | Admitting: Emergency Medicine

## 2020-05-23 ENCOUNTER — Other Ambulatory Visit: Payer: Self-pay

## 2020-05-23 DIAGNOSIS — Z87891 Personal history of nicotine dependence: Secondary | ICD-10-CM | POA: Insufficient documentation

## 2020-05-23 DIAGNOSIS — Z79899 Other long term (current) drug therapy: Secondary | ICD-10-CM | POA: Insufficient documentation

## 2020-05-23 DIAGNOSIS — J4520 Mild intermittent asthma, uncomplicated: Secondary | ICD-10-CM | POA: Insufficient documentation

## 2020-05-23 DIAGNOSIS — N481 Balanitis: Secondary | ICD-10-CM | POA: Insufficient documentation

## 2020-05-23 DIAGNOSIS — Z7951 Long term (current) use of inhaled steroids: Secondary | ICD-10-CM | POA: Insufficient documentation

## 2020-05-23 MED ORDER — CLOTRIMAZOLE 1 % EX CREA
TOPICAL_CREAM | Freq: Two times a day (BID) | CUTANEOUS | Status: DC
  Administered 2020-05-23: 1 via TOPICAL
  Filled 2020-05-23: qty 15

## 2020-05-23 NOTE — Discharge Instructions (Signed)
Please use the provided cream twice daily.  Return here for concerning changes in your condition.

## 2020-05-23 NOTE — ED Triage Notes (Signed)
Patient here from home reporting that he was seen at [facility] for genital rash, blood in urine, and pain on 9/7. States that they diagnosed him with herpes and "the spirit of the Lord" told him it was the wrong diagnosis and that he needs a second opinion.

## 2020-05-23 NOTE — ED Provider Notes (Signed)
[facility] DEPT Provider Note   CSN: [id number] Arrival date & time: 05/23/20  1532     History Chief Complaint  Patient presents with  . Penis Pain    Zachary [name] is a 59 y.o. male.  HPI  Patient presents 2 days after being seen at our affiliated facility, now with concern for his penile lesions. He does have a known inguinal hernia, but denies any changes in this regard, including nausea, vomiting, changes in bowel movements, or new pain. He was diagnosed with folliculitis and herpes 2 days ago. He denies any interval fever, chills, dysuria or other new complaints per He has been taking his Keflex, but did not start taking his antiviral medication, as he questions that diagnosis.  There is no details obtained by chart review, including evaluation 2 days ago.  During that evaluation patient was noted to have had recent resumption of sexual encounter with prior partner. Physical exam at that point suggested herpetic lesions.    Past Medical History:  Diagnosis Date  . Asthma   . Knee pain     Patient Active Problem List   Diagnosis Date Noted  . Inguinal hernia of right side without obstruction or gangrene 03/17/2020  . Periodontal disease 03/17/2020  . Dental caries 03/17/2020  . Mild intermittent asthma without complication 32/44/0102  . Gastroesophageal reflux disease without esophagitis 03/17/2020  . Chronic pain of right knee 03/17/2020    Past Surgical History:  Procedure Laterality Date  . KNEE ARTHROSCOPY     x 2       Family History  Problem Relation Age of Onset  . Hypertension Mother   . Cancer Father   . Cancer Other     Social History   Tobacco Use  . Smoking status: Former Research scientist (life sciences)  . Smokeless tobacco: Never Used  Vaping Use  . Vaping Use: Never used  Substance Use Topics  . Alcohol use: No  . Drug use: No    Home Medications Prior to Admission medications   Medication Sig Start Date End Date  Taking? Authorizing Provider  acyclovir (ZOVIRAX) 400 MG tablet Take 1 tablet (400 mg total) by mouth 4 (four) times daily. 05/19/20   Palumbo, April, MD  albuterol (VENTOLIN HFA) 108 (90 Base) MCG/ACT inhaler Inhale 2 puffs into the lungs every 2 (two) hours as needed for wheezing or shortness of breath (cough). 03/17/20   Elsie Stain, MD  cephALEXin (KEFLEX) 500 MG capsule Take 1 capsule (500 mg total) by mouth 4 (four) times daily. 05/19/20   Palumbo, April, MD  pantoprazole (PROTONIX) 20 MG tablet Take 1 tablet (20 mg total) by mouth daily. 03/17/20   Elsie Stain, MD  famotidine (PEPCID) 20 MG tablet Take 1 tablet (20 mg total) by mouth 2 (two) times daily. Patient not taking: Reported on 01/09/2019 06/12/18 01/19/20  Orpah Greek, MD  fluticasone [facility]) 50 MCG/ACT nasal spray Place 2 sprays into both nostrils daily. Patient not taking: Reported on 01/09/2019 11/11/17 01/19/20  Carmin Muskrat, MD    Allergies    Patient has no known allergies.  Review of Systems   Review of Systems  Constitutional:       Per HPI, otherwise negative  HENT:       Per HPI, otherwise negative  Respiratory:       Per HPI, otherwise negative  Cardiovascular:       Per HPI, otherwise negative  Gastrointestinal: Negative for vomiting.  Endocrine:  Negative aside from HPI  Genitourinary:       Neg aside from HPI   Musculoskeletal:       Per HPI, otherwise negative  Skin: Positive for color change and rash.  Neurological: Negative for syncope.    Physical Exam Updated Vital Signs BP (!) 136/102 (BP Location: Left Arm)   Pulse 81   Temp 98.9 F (37.2 C) (Oral)   Resp 18   SpO2 97%   Physical Exam Vitals and nursing note reviewed.  Constitutional:      General: He is not in acute distress.    Appearance: He is well-developed.  HENT:     Head: Normocephalic and atraumatic.  Eyes:     Conjunctiva/sclera: Conjunctivae normal.  Cardiovascular:     Rate and Rhythm: Normal  rate and regular rhythm.  Pulmonary:     Effort: Pulmonary effort is normal. No respiratory distress.     Breath sounds: No stridor.  Abdominal:     General: There is no distension.     Comments: Patient has known right-sided inguinal hernia, denies any complaints, changes.  Genitourinary:   Skin:    General: Skin is warm and dry.  Neurological:     Mental Status: He is alert and oriented to person, place, and time.     ED Results / Procedures / Treatments   Labs (all labs ordered are listed, but only abnormal results are displayed) Labs Reviewed - No data to display  EKG None  Radiology No results found.  Procedures Procedures (including critical care time)  Medications Ordered in ED Medications  clotrimazole (LOTRIMIN) 1 % cream (has no administration in time range)    ED Course  I have reviewed the triage vital signs and the nursing notes.  Pertinent labs & imaging results that were available during my care of the patient were reviewed by me and considered in my medical decision making (see chart for details).  This adult male awake, alert, ambulatory, not presents with concern for persistent penile lesions. He does have a known hernia, but this is not an issue currently.  No evidence for bacteremia, sepsis.  Today's lesion consistent with folliculitis, but also balanitis.  Patient has a primary care physician, with whom you follow-up in 1 week for repeat evaluation.  Final Clinical Impression(s) / ED Diagnoses Final diagnoses:  Balanitis    Rx / DC Orders Clotrimazole   Carmin Muskrat, MD 05/23/20 1725

## 2020-06-02 ENCOUNTER — Encounter: Payer: Self-pay | Admitting: Family Medicine

## 2020-06-02 ENCOUNTER — Ambulatory Visit: Payer: Self-pay | Attending: Critical Care Medicine | Admitting: Family Medicine

## 2020-06-02 ENCOUNTER — Other Ambulatory Visit: Payer: Self-pay

## 2020-06-02 VITALS — BP 138/92 | HR 80 | Temp 98.4°F | Resp 16 | Wt 141.8 lb

## 2020-06-02 DIAGNOSIS — R21 Rash and other nonspecific skin eruption: Secondary | ICD-10-CM

## 2020-06-02 DIAGNOSIS — R350 Frequency of micturition: Secondary | ICD-10-CM

## 2020-06-02 LAB — GLUCOSE, POCT (MANUAL RESULT ENTRY): POC Glucose: 99 mg/dL (ref 70–99)

## 2020-06-02 LAB — POCT URINALYSIS DIP (CLINITEK)
Bilirubin, UA: NEGATIVE
Blood, UA: NEGATIVE
Glucose, UA: NEGATIVE mg/dL
Ketones, POC UA: NEGATIVE mg/dL
Leukocytes, UA: NEGATIVE
Nitrite, UA: NEGATIVE
POC PROTEIN,UA: NEGATIVE
Spec Grav, UA: 1.025
Urobilinogen, UA: 0.2 U/dL
pH, UA: 5.5

## 2020-06-02 MED ORDER — NYSTATIN 100000 UNIT/GM EX OINT: 1.0000 "application " | TOPICAL_OINTMENT | Freq: Two times a day (BID) | CUTANEOUS | 0 refills | Status: DC

## 2020-06-02 MED ORDER — FLUCONAZOLE 150 MG PO TABS: 150.0000 mg | ORAL_TABLET | Freq: Every day | ORAL | 0 refills | Status: DC

## 2020-06-02 MED FILL — NYSTATIN 100,000 UNITS/GM O: 100000 | 10 days supply | Qty: 60 | Fill #0

## 2020-06-02 MED FILL — FLUCONAZOLE 150 MG TABLET: 150 | 2 days supply | Qty: 2 | Fill #0

## 2020-06-04 NOTE — Progress Notes (Signed)
Established Patient Office Visit  Subjective:  Patient ID: Zachary [name], male    DOB: 09/20/1960  Age: 59 y.o. MRN: [id number]  CC:  Chief Complaint  Patient presents with  . Hospitalization Follow-up    ED    HPI Zachary [name], 59 year old African-American male, who presents secondary to complaint of having a boil/nodule on his mid/lower pubic area which he states came to ahead and drained yellowish puslike material.  He states that he went to the emergency department on 05/18/2020 due to this nodule and he reports that he was diagnosed with having herpes.  He reports that he has never had herpes infection before.  He was also prescribed antibiotic which he has now completed.  He then returned to the emergency department on 05/23/2020 as he thought that he had a rash/lesions on his penis.  He reports that this is now resolved.  He has noticed that he has had recent increase in frequency of urination.  He denies any burning with urination.  No penile discharge.  He does feel as if the skin on the backside of his scrotum and inner thighs is irritated/itchy.  Past Medical History:  Diagnosis Date  . Asthma   . Knee pain     Past Surgical History:  Procedure Laterality Date  . KNEE ARTHROSCOPY     x 2    Family History  Problem Relation Age of Onset  . Hypertension Mother   . Cancer Father   . Cancer Other     Social History   Socioeconomic History  . Marital status: Single    Spouse name: Not on file  . Number of children: Not on file  . Years of education: Not on file  . Highest education level: Not on file  Occupational History  . Not on file  Tobacco Use  . Smoking status: Former Research scientist (life sciences)  . Smokeless tobacco: Never Used  Vaping Use  . Vaping Use: Never used  Substance and Sexual Activity  . Alcohol use: No  . Drug use: No  . Sexual activity: Yes  Other Topics Concern  . Not on file  Social History Narrative  . Not on file   Social Determinants of  Health   Financial Resource Strain:   . Difficulty of Paying Living Expenses: Not on file  Food Insecurity:   . Worried About Charity fundraiser in the Last Year: Not on file  . Ran Out of Food in the Last Year: Not on file  Transportation Needs:   . Lack of Transportation (Medical): Not on file  . Lack of Transportation (Non-Medical): Not on file  Physical Activity:   . Days of Exercise per Week: Not on file  . Minutes of Exercise per Session: Not on file  Stress:   . Feeling of Stress : Not on file  Social Connections:   . Frequency of Communication with Friends and Family: Not on file  . Frequency of Social Gatherings with Friends and Family: Not on file  . Attends Religious Services: Not on file  . Active Member of Clubs or Organizations: Not on file  . Attends Archivist Meetings: Not on file  . Marital Status: Not on file  Intimate Partner Violence:   . Fear of Current or Ex-Partner: Not on file  . Emotionally Abused: Not on file  . Physically Abused: Not on file  . Sexually Abused: Not on file    Outpatient Medications Prior to Visit  Medication  Sig Dispense Refill  . acyclovir (ZOVIRAX) 400 MG tablet Take 1 tablet (400 mg total) by mouth 4 (four) times daily. 50 tablet 0  . albuterol (VENTOLIN HFA) 108 (90 Base) MCG/ACT inhaler Inhale 2 puffs into the lungs every 2 (two) hours as needed for wheezing or shortness of breath (cough). 18 g 0  . cephALEXin (KEFLEX) 500 MG capsule Take 1 capsule (500 mg total) by mouth 4 (four) times daily. 28 capsule 0  . pantoprazole (PROTONIX) 20 MG tablet Take 1 tablet (20 mg total) by mouth daily. 30 tablet 1   No facility-administered medications prior to visit.    No Known Allergies  ROS Review of Systems  Constitutional: Negative for chills and fever.  Respiratory: Negative for cough and shortness of breath.   Cardiovascular: Negative for chest pain and palpitations.  Gastrointestinal: Negative for abdominal pain,  constipation, diarrhea and nausea.  Endocrine: Positive for polyuria. Negative for polydipsia and polyphagia.  Genitourinary: Positive for frequency. Negative for dysuria, genital sores and penile pain.  Skin: Positive for rash. Negative for wound.  Neurological: Negative for dizziness and headaches.  Hematological: Negative for adenopathy. Does not bruise/bleed easily.  Psychiatric/Behavioral: Negative for suicidal ideas. The patient is not nervous/anxious.       Objective:    Physical Exam Vitals and nursing note reviewed.  Constitutional:      Appearance: Normal appearance.  Cardiovascular:     Rate and Rhythm: Normal rate and regular rhythm.  Pulmonary:     Effort: Pulmonary effort is normal.     Breath sounds: Normal breath sounds.  Abdominal:     Palpations: Abdomen is soft.     Tenderness: There is no abdominal tenderness. There is no right CVA tenderness, left CVA tenderness, guarding or rebound.     Hernia: A hernia (Stable, nontender right inguinal hernia) is present.  Musculoskeletal:     Right lower leg: No edema.     Left lower leg: No edema.  Skin:    General: Skin is warm.     Findings: No lesion.     Comments: Patient with warm moist, slightly erythematous skin in the inguinal skin folds.  No obvious rash on the scrotum/genitals  Neurological:     General: No focal deficit present.     Mental Status: He is alert and oriented to person, place, and time.     BP (!) 138/92   Pulse 80   Temp 98.4 F (36.9 C)   Resp 16   Wt 141 lb 12.8 oz (64.3 kg)   SpO2 98%   BMI 22.89 kg/m  Wt Readings from Last 3 Encounters:  06/02/20 141 lb 12.8 oz (64.3 kg)  03/17/20 142 lb (64.4 kg)  01/19/20 156 lb (70.8 kg)     Health Maintenance Due  Topic Date Due  . TETANUS/TDAP  Never done  . COVID-19 Vaccine (2 - Moderna 2-dose series) 03/27/2020  . INFLUENZA VACCINE  Never done     No results found for: TSH Lab Results  Component Value Date   WBC 4.5  05/18/2020   HGB 13.8 05/18/2020   HCT 40.7 05/18/2020   MCV 90.2 05/18/2020   PLT 237 05/18/2020   Lab Results  Component Value Date   NA 140 05/18/2020   K 3.7 05/18/2020   CO2 29 05/18/2020   GLUCOSE 96 05/18/2020   BUN 8 05/18/2020   CREATININE 0.89 05/18/2020   BILITOT 0.9 05/18/2020   ALKPHOS 67 05/18/2020   AST 20 05/18/2020  ALT 27 05/18/2020   PROT 8.0 05/18/2020   ALBUMIN 4.2 05/18/2020   CALCIUM 9.6 05/18/2020   ANIONGAP 6 05/18/2020   Lab Results  Component Value Date   CHOL 201 (H) 07/15/2007   Lab Results  Component Value Date   HDL 40 07/15/2007   Lab Results  Component Value Date   LDLCALC 119 (H) 07/15/2007   Lab Results  Component Value Date   TRIG 209 (H) 07/15/2007   Lab Results  Component Value Date   CHOLHDL 5.0 Ratio 07/15/2007   No results found for: HGBA1C    Assessment & Plan:  1. Frequent urination He reports that he feels as if he is having frequent urination.  Glucose at today's visit was 99 therefore hyperglycemia/diabetes is not likely to be a contributing factor.  Urinalysis was done to look for possible urinary tract infection and urinalysis at today's visit was negative/normal.  He also had recent normal urinalysis on 05/18/2020 at his ED visit.  Comprehensive metabolic panel and CBC were also normal on 05/18/2020 on review of chart.  He has been asked to try to engage if he is drinking excessive water/fluids which may be contributing to his symptoms of urinary frequency.  If his symptoms continue, he should return to clinic for reevaluation - POCT URINALYSIS DIP (CLINITEK) - Glucose (CBG)  2. Rash and nonspecific skin eruption; 3.  Encounter for examination following treatment at the emergency department Patient is status post emergency department visit on 05/18/2020 at which time he was diagnosed with folliculitis the pubis and placed on Keflex but was also told that he may have a herpes type lesion.  There was no evidence of skin  lesions at today's visit.  He also was seen on 05/23/2020 at the emergency department and diagnosed with balanitis.  At today's visit, he appears to have tinea cruris/fungal infection.  Prescription provided for Diflucan to take x1 today then repeated dose in 3 days and prescription provided for nystatin ointment.  He is to call or return if his symptoms have not improved after treatment. - fluconazole (DIFLUCAN) 150 MG tablet; Take 1 tablet (150 mg total) by mouth daily. And repeat in 3 days  Dispense: 2 tablet; Refill: 0 - nystatin ointment (MYCOSTATIN); Apply 1 application topically 2 (two) times daily. To area of rash x 10 days then as needed  Dispense: 60 g; Refill: 0   Meds ordered this encounter  Medications  . fluconazole (DIFLUCAN) 150 MG tablet    Sig: Take 1 tablet (150 mg total) by mouth daily. And repeat in 3 days    Dispense:  2 tablet    Refill:  0  . nystatin ointment (MYCOSTATIN)    Sig: Apply 1 application topically 2 (two) times daily. To area of rash x 10 days then as needed    Dispense:  60 g    Refill:  0    WILL PICK UP TODAY    Follow-up: Return in about 2 weeks (around 06/16/2020) for rash- follow-up with Dr. Joya [name] if not better.    Antony Blackbird, MD

## 2020-06-25 ENCOUNTER — Other Ambulatory Visit: Payer: Self-pay

## 2020-06-25 ENCOUNTER — Ambulatory Visit: Payer: Self-pay | Attending: Family | Admitting: Family

## 2020-06-25 DIAGNOSIS — R350 Frequency of micturition: Secondary | ICD-10-CM

## 2020-06-25 DIAGNOSIS — R21 Rash and other nonspecific skin eruption: Secondary | ICD-10-CM

## 2020-06-25 NOTE — Progress Notes (Signed)
Virtual Visit via Telephone Note  I connected with Zachary [name], on 06/25/2020 at 8:09 AM by telephone due to the COVID-19 pandemic and verified that I am speaking with the correct person using two identifiers.  Due to current restrictions/limitations of in-office visits due to the COVID-19 pandemic, this scheduled clinical appointment was converted to a telehealth visit.   Consent: I discussed the limitations, risks, security and privacy concerns of performing an evaluation and management service by telephone and the availability of in person appointments. I also discussed with the patient that there may be a patient responsible charge related to this service. The patient expressed understanding and agreed to proceed.  Location of Patient: Home  Location of Provider: [facility]  Persons participating in Telemedicine visit: Zachary [name], Zachary [name], Zachary [name]  History of Present Illness: Zachary [name] is a 59 year-old male with history of mild intermittent asthma without complication, periodontal disease, GERD, inguinal hernia of right side without obstruction or gangrene, and chronic pain of right knee who presents for 2 week follow-up.  1. FREQUENT URINATION FOLLOW-UP: 06/02/2020: Visit with Dr. Chapman Fitch. Glucose 99 therefore hyperglycemia/diabetes not likely a contributing factor. Urinalysis normal. Recent CMP and CBC normal.  06/25/2020: No longer having frequent urination unless taking pills that he describes as red pills.   2. RASH FOLLOW-UP: 06/02/2020: Visit post emergency department on 05/18/2020 at which time he was diagnosed with folliculitis of the pubis and placed on Keflex but was also told he may have a herpes type lesion. No evidence of skin lesions at current visit. Seen on 05/23/2020 at the emergency department and diagnosed with balanitis. At current visit he appeared to have tinea curis/fungal infection. Prescription  provided for Nystatin ointment and Fluconazole.   06/25/2020: Rash is gone. The only time groin are becomes irritated is if he sweats. Still using Nystatin ointment.  Past Medical History:  Diagnosis Date  . Asthma   . Knee pain    No Known Allergies  Current Outpatient Medications on File Prior to Visit  Medication Sig Dispense Refill  . acyclovir (ZOVIRAX) 400 MG tablet Take 1 tablet (400 mg total) by mouth 4 (four) times daily. 50 tablet 0  . albuterol (VENTOLIN HFA) 108 (90 Base) MCG/ACT inhaler Inhale 2 puffs into the lungs every 2 (two) hours as needed for wheezing or shortness of breath (cough). 18 g 0  . cephALEXin (KEFLEX) 500 MG capsule Take 1 capsule (500 mg total) by mouth 4 (four) times daily. 28 capsule 0  . fluconazole (DIFLUCAN) 150 MG tablet Take 1 tablet (150 mg total) by mouth daily. And repeat in 3 days 2 tablet 0  . nystatin ointment (MYCOSTATIN) Apply 1 application topically 2 (two) times daily. To area of rash x 10 days then as needed 60 g 0  . pantoprazole (PROTONIX) 20 MG tablet Take 1 tablet (20 mg total) by mouth daily. 30 tablet 1  . [DISCONTINUED] famotidine (PEPCID) 20 MG tablet Take 1 tablet (20 mg total) by mouth 2 (two) times daily. (Patient not taking: Reported on 01/09/2019) 60 tablet 0  . [DISCONTINUED] fluticasone (FLONASE) 50 MCG/ACT nasal spray Place 2 sprays into both nostrils daily. (Patient not taking: Reported on 01/09/2019) 15.8 g 0   No current facility-administered medications on file prior to visit.    Observations/Objective: Alert and oriented x 3. Not in acute distress. Physical examination not completed as this is a telemedicine visit.  Assessment and Plan: 1. Frequent  urination: - Resolved.   2. Rash and nonspecific skin eruption: - Resolved and still using Nystatin ointment as needed.  Follow Up Instructions: Follow-up with primary physician as needed.   Patient was given clear instructions to go to Emergency Department or  return to medical center if symptoms don't improve, worsen, or new problems develop.The patient verbalized understanding.  I discussed the assessment and treatment plan with the patient. The patient was provided an opportunity to ask questions and all were answered. The patient agreed with the plan and demonstrated an understanding of the instructions.   The patient was advised to call back or seek an in-person evaluation if the symptoms worsen or if the condition fails to improve as anticipated.   I provided 5 minutes total of non-face-to-face time during this encounter including median intraservice time, reviewing previous notes, labs, imaging, medications, management and patient verbalized understanding.    Camillia Herter, NP  [facility], [place]   06/25/2020, 8:09 AM

## 2020-06-25 NOTE — Patient Instructions (Signed)
Rash, Adult  A rash is a change in the color of your skin. A rash can also change the way your skin feels. There are many different conditions and factors that can cause a rash. Follow these instructions at home: The goal of treatment is to stop the itching and keep the rash from spreading. Watch for any changes in your symptoms. Let your doctor know about them. Follow these instructions to help with your condition: Medicine Take or apply over-the-counter and prescription medicines only as told by your doctor. These may include medicines:  To treat red or swollen skin (corticosteroid creams).  To treat itching.  To treat an allergy (oral antihistamines).  To treat very bad symptoms (oral corticosteroids).  Skin care  Put cool cloths (compresses) on the affected areas.  Do not scratch or rub your skin.  Avoid covering the rash. Make sure that the rash is exposed to air as much as possible. Managing itching and discomfort  Avoid hot showers or baths. These can make itching worse. A cold shower may help.  Try taking a bath with: ? Epsom salts. You can get these at your local pharmacy or grocery store. Follow the instructions on the package. ? Baking soda. Pour a small amount into the bath as told by your doctor. ? Colloidal oatmeal. You can get this at your local pharmacy or grocery store. Follow the instructions on the package.  Try putting baking soda paste onto your skin. Stir water into baking soda until it gets like a paste.  Try putting on a lotion that relieves itchiness (calamine lotion).  Keep cool and out of the sun. Sweating and being hot can make itching worse. General instructions   Rest as needed.  Drink enough fluid to keep your pee (urine) pale yellow.  Wear loose-fitting clothing.  Avoid scented soaps, detergents, and perfumes. Use gentle soaps, detergents, perfumes, and other cosmetic products.  Avoid anything that causes your rash. Keep a journal to  help track what causes your rash. Write down: ? What you eat. ? What cosmetic products you use. ? What you drink. ? What you wear. This includes jewelry.  Keep all follow-up visits as told by your doctor. This is important. Contact a doctor if:  You sweat at night.  You lose weight.  You pee (urinate) more than normal.  You pee less than normal, or you notice that your pee is a darker color than normal.  You feel weak.  You throw up (vomit).  Your skin or the whites of your eyes look yellow (jaundice).  Your skin: ? Tingles. ? Is numb.  Your rash: ? Does not go away after a few days. ? Gets worse.  You are: ? More thirsty than normal. ? More tired than normal.  You have: ? New symptoms. ? Pain in your belly (abdomen). ? A fever. ? Watery poop (diarrhea). Get help right away if:  You have a fever and your symptoms suddenly get worse.  You start to feel mixed up (confused).  You have a very bad headache or a stiff neck.  You have very bad joint pains or stiffness.  You have jerky movements that you cannot control (seizure).  Your rash covers all or most of your body. The rash may or may not be painful.  You have blisters that: ? Are on top of the rash. ? Grow larger. ? Grow together. ? Are painful. ? Are inside your nose or mouth.  You have a rash   that: ? Looks like purple pinprick-sized spots all over your body. ? Has a "bull's eye" or looks like a target. ? Is red and painful, causes your skin to peel, and is not from being in the sun too long. Summary  A rash is a change in the color of your skin. A rash can also change the way your skin feels.  The goal of treatment is to stop the itching and keep the rash from spreading.  Take or apply over-the-counter and prescription medicines only as told by your doctor.  Contact a doctor if you have new symptoms or symptoms that get worse.  Keep all follow-up visits as told by your doctor. This is  important. This information is not intended to replace advice given to you by your health care provider. Make sure you discuss any questions you have with your health care provider. Document Revised: 12/20/2018 Document Reviewed: 04/01/2018 Elsevier Patient Education  2020 [employer]  

## 2020-07-05 ENCOUNTER — Other Ambulatory Visit: Payer: Self-pay

## 2020-07-05 ENCOUNTER — Other Ambulatory Visit: Payer: Self-pay | Admitting: Critical Care Medicine

## 2020-07-05 ENCOUNTER — Ambulatory Visit: Payer: Self-pay | Attending: Critical Care Medicine | Admitting: Critical Care Medicine

## 2020-07-05 ENCOUNTER — Encounter: Payer: Self-pay | Admitting: Critical Care Medicine

## 2020-07-05 VITALS — BP 125/85 | HR 70 | Temp 97.3°F | Resp 17 | Wt 142.0 lb

## 2020-07-05 DIAGNOSIS — J4520 Mild intermittent asthma, uncomplicated: Secondary | ICD-10-CM

## 2020-07-05 DIAGNOSIS — K219 Gastro-esophageal reflux disease without esophagitis: Secondary | ICD-10-CM

## 2020-07-05 DIAGNOSIS — L309 Dermatitis, unspecified: Secondary | ICD-10-CM | POA: Insufficient documentation

## 2020-07-05 DIAGNOSIS — K056 Periodontal disease, unspecified: Secondary | ICD-10-CM

## 2020-07-05 DIAGNOSIS — K4090 Unilateral inguinal hernia, without obstruction or gangrene, not specified as recurrent: Secondary | ICD-10-CM

## 2020-07-05 DIAGNOSIS — K029 Dental caries, unspecified: Secondary | ICD-10-CM

## 2020-07-05 MED ORDER — TRIAMCINOLONE ACETONIDE 0.1 % EX CREA
1.0000 | TOPICAL_CREAM | Freq: Two times a day (BID) | CUTANEOUS | 0 refills | Status: DC
Start: 2020-07-05 — End: 2020-11-14

## 2020-07-05 MED FILL — TRIAMCINOLONE 0.1% CREAM: 0.1 | 15 days supply | Qty: 30 | Fill #0

## 2020-07-05 NOTE — Progress Notes (Signed)
Subjective:    Patient ID: Zachary [name], male    DOB: 05-15-1961, 59 y.o.   MRN: [id number]  03/17/20 This is a pleasant 59 year old male who had been seen in the emergency room for chest discomfort in late May.  Work-up for cardiac disorder was negative.  Chest x-ray negative.  Patient was given prescription for Protonix and he states this is resolved his chest pain.  He also has history of mild intermittent asthma and had an albuterol inhaler in the past but no longer has this.  Patient also complains of a right inguinal hernia but does not have current pain from this.  No prior history of known hypertension or diabetes.  No other significant complaints at this time.  Patient does have prior history of right knee pain with prior arthroscopy knee pain is tolerable at this moment.  Patient does have dental caries and has remaining teeth in the lower front jaw and the left posterior jaw that need to be removed other teeth been extracted  07/05/2020 This patient is seen in return follow-up and was seen recently for tinea cruris which has resolved using antifungal therapy.  He states he does have occasional chest tightness but shortness of breath is at baseline with his asthma.  He complains that his right inguinal hernia is slightly larger.  He is yet to see a dentist for his severe dental caries.  He states proton pump inhibitor does help his reflux symptoms.  He does wish to avoid a flu vaccine at this time.  However he is up-to-date on his Covid vaccine.  His frequent urination situation has resolved itself and he does not have diabetes on blood screen  At the last visit colon cancer screen was negative and HCV was negative  Past Medical History:  Diagnosis Date  . Asthma   . Knee pain      Family History  Problem Relation Age of Onset  . Hypertension Mother   . Cancer Father   . Cancer Other      Social History   Socioeconomic History  . Marital status: Single    Spouse name:  Not on file  . Number of children: Not on file  . Years of education: Not on file  . Highest education level: Not on file  Occupational History  . Not on file  Tobacco Use  . Smoking status: Former Research scientist (life sciences)  . Smokeless tobacco: Never Used  Vaping Use  . Vaping Use: Never used  Substance and Sexual Activity  . Alcohol use: No  . Drug use: No  . Sexual activity: Yes  Other Topics Concern  . Not on file  Social History Narrative  . Not on file   Social Determinants of Health   Financial Resource Strain:   . Difficulty of Paying Living Expenses: Not on file  Food Insecurity:   . Worried About Charity fundraiser in the Last Year: Not on file  . Ran Out of Food in the Last Year: Not on file  Transportation Needs:   . Lack of Transportation (Medical): Not on file  . Lack of Transportation (Non-Medical): Not on file  Physical Activity:   . Days of Exercise per Week: Not on file  . Minutes of Exercise per Session: Not on file  Stress:   . Feeling of Stress : Not on file  Social Connections:   . Frequency of Communication with Friends and Family: Not on file  . Frequency of Social Gatherings with  Friends and Family: Not on file  . Attends Religious Services: Not on file  . Active Member of Clubs or Organizations: Not on file  . Attends Archivist Meetings: Not on file  . Marital Status: Not on file  Intimate Partner Violence:   . Fear of Current or Ex-Partner: Not on file  . Emotionally Abused: Not on file  . Physically Abused: Not on file  . Sexually Abused: Not on file     No Known Allergies   Outpatient Medications Prior to Visit  Medication Sig Dispense Refill  . albuterol (VENTOLIN HFA) 108 (90 Base) MCG/ACT inhaler Inhale 2 puffs into the lungs every 2 (two) hours as needed for wheezing or shortness of breath (cough). 18 g 0  . nystatin ointment (MYCOSTATIN) Apply 1 application topically 2 (two) times daily. To area of rash x 10 days then as needed 60 g 0   . acyclovir (ZOVIRAX) 400 MG tablet Take 1 tablet (400 mg total) by mouth 4 (four) times daily. 50 tablet 0  . cephALEXin (KEFLEX) 500 MG capsule Take 1 capsule (500 mg total) by mouth 4 (four) times daily. 28 capsule 0  . fluconazole (DIFLUCAN) 150 MG tablet Take 1 tablet (150 mg total) by mouth daily. And repeat in 3 days 2 tablet 0  . pantoprazole (PROTONIX) 20 MG tablet Take 1 tablet (20 mg total) by mouth daily. 30 tablet 1   No facility-administered medications prior to visit.       Review of Systems  Constitutional: Negative for fever.  HENT: Negative for dental problem, ear discharge, ear pain, postnasal drip, rhinorrhea, sinus pressure, sinus pain, sneezing, sore throat and trouble swallowing.   Eyes: Negative.   Respiratory: Positive for chest tightness. Negative for cough, choking, shortness of breath, wheezing and stridor.   Cardiovascular: Negative for chest pain.  Gastrointestinal: Negative.   Endocrine: Negative for polydipsia, polyphagia and polyuria.  Genitourinary: Negative.   Musculoskeletal: Negative for arthralgias, back pain, gait problem, joint swelling, myalgias, neck pain and neck stiffness.       Right knee surgery , chronic pain R knee  Skin: Negative.   Neurological: Negative.   Hematological: Negative.   Psychiatric/Behavioral: Negative.        Objective:   Physical Exam Vitals:   07/05/20 1122  BP: 125/85  Pulse: 70  Resp: 17  Temp: (!) 97.3 F (36.3 C)  TempSrc: Temporal  SpO2: 97%  Weight: 142 lb (64.4 kg)    Gen: Pleasant, well-nourished, in no distress,  normal affect  ENT: Poor dentition is seen in the lower jaw area,  mouth clear,  oropharynx clear, no postnasal drip  Neck: No JVD, no TMG, no carotid bruits  Lungs: No use of accessory muscles, no dullness to percussion, clear without rales or rhonchi  Cardiovascular: RRR, heart sounds normal, no murmur or gallops, no peripheral edema  Abdomen: soft and NT, no HSM,  BS normal    right inguinal hernia is slightly enlarged but is not reducible but does not appear to contain any bowel  Musculoskeletal: No deformities, no cyanosis or clubbing  Neuro: alert, non focal  Skin: Warm, chronic dermatitis of the posterior scalp and neck is noted    Assessment & Plan:  I personally reviewed all images and lab data in the [facility] system as well as any outside material available during this office visit and agree with the  radiology impressions.   Mild intermittent asthma without complication Mild intermittent asthma stable at this  time continue as needed albuterol  Dental caries Multiple dental caries I did give the patient dental resources to review  Gastroesophageal reflux disease without esophagitis Patient continue Pepcid AC for reflux  Periodontal disease As per dental caries assessment  Inguinal hernia of right side without obstruction or gangrene I will have Dr. Hulen Skains take a look at this patient's hernia and have asked the patient to apply for financial [place] discount so we could consider elective repair of the hernia in the interim if he worsens acutely he is told to go to the emergency room as they will care for him there if he has an acute incarcerated hernia  Dermatitis Probable allergic dermatitis posterior neck has given the patient triamcinolone cream to try topically   Jakyri was seen today for hernia and asthma.  Diagnoses and all orders for this visit:  Inguinal hernia of right side without obstruction or gangrene  Mild intermittent asthma without complication  Dental caries  Gastroesophageal reflux disease without esophagitis  Periodontal disease  Dermatitis  Other orders -     triamcinolone cream (KENALOG) 0.1 %; Apply 1 application topically 2 (two) times daily.  The patient did decline the flu vaccine

## 2020-07-05 NOTE — Assessment & Plan Note (Signed)
Multiple dental caries I did give the patient dental resources to review

## 2020-07-05 NOTE — Assessment & Plan Note (Signed)
Probable allergic dermatitis posterior neck has given the patient triamcinolone cream to try topically

## 2020-07-05 NOTE — Assessment & Plan Note (Signed)
I will have Dr. Hulen Skains take a look at this patient's hernia and have asked the patient to apply for financial [place] discount so we could consider elective repair of the hernia in the interim if he worsens acutely he is told to go to the emergency room as they will care for him there if he has an acute incarcerated hernia

## 2020-07-05 NOTE — Assessment & Plan Note (Signed)
As per dental caries assessment

## 2020-07-05 NOTE — Assessment & Plan Note (Signed)
Patient continue Pepcid [facility] for reflux

## 2020-07-05 NOTE — Assessment & Plan Note (Signed)
Mild intermittent asthma stable at this time continue as needed albuterol

## 2020-07-05 NOTE — Patient Instructions (Signed)
Please make an appointment with a dentist to have your teeth cleaned and the teeth removed that are severely damaged  Please obtain [place] discount paperwork to fill out so that we can have you see a surgeon who could electively repair your hernia  If you develop acute pain in the right groin go to the emergency room as you may have trapped bowel in the hernia and they would see you there emergently  Triamcinolone cream was sent to the pharmacy for the back of your neck for the rash on your neck  Use albuterol as needed  You declined the flu vaccine at this visit  An appointment with Dr. Hulen Skains will be made here in the clinic to look at your hernia  Return to see Dr. Joya [name] 4 months

## 2020-07-20 ENCOUNTER — Ambulatory Visit: Payer: Self-pay | Admitting: General Surgery

## 2020-08-12 ENCOUNTER — Emergency Department (HOSPITAL_COMMUNITY): Payer: Self-pay

## 2020-08-12 ENCOUNTER — Emergency Department (HOSPITAL_COMMUNITY)
Admission: EM | Admit: 2020-08-12 | Discharge: 2020-08-13 | Disposition: A | Discharge status: Home / Self Care | Payer: Self-pay | Attending: Emergency Medicine | Admitting: Emergency Medicine

## 2020-08-12 ENCOUNTER — Encounter (HOSPITAL_COMMUNITY): Payer: Self-pay | Admitting: Emergency Medicine

## 2020-08-12 DIAGNOSIS — J069 Acute upper respiratory infection, unspecified: Secondary | ICD-10-CM | POA: Insufficient documentation

## 2020-08-12 DIAGNOSIS — Z7951 Long term (current) use of inhaled steroids: Secondary | ICD-10-CM | POA: Insufficient documentation

## 2020-08-12 DIAGNOSIS — Z87891 Personal history of nicotine dependence: Secondary | ICD-10-CM | POA: Insufficient documentation

## 2020-08-12 DIAGNOSIS — H9201 Otalgia, right ear: Secondary | ICD-10-CM | POA: Insufficient documentation

## 2020-08-12 DIAGNOSIS — Z20822 Contact with and (suspected) exposure to covid-19: Secondary | ICD-10-CM | POA: Insufficient documentation

## 2020-08-12 DIAGNOSIS — J45909 Unspecified asthma, uncomplicated: Secondary | ICD-10-CM | POA: Insufficient documentation

## 2020-08-12 NOTE — ED Triage Notes (Signed)
Pt c/o runny nose, congestion, productive cough x 3 days. Denies chest pain, shob, N/V/D, Covid exposure. Pt is vaccinated. Pt states he took cough medicine with no relief. Hx of asthma.

## 2020-08-13 LAB — RESP PANEL BY RT-PCR (FLU A&B, COVID) ARPGX2
Influenza A by PCR: NEGATIVE
Influenza B by PCR: NEGATIVE
SARS Coronavirus 2 by RT PCR: NEGATIVE

## 2020-08-13 MED ORDER — BENZONATATE 100 MG PO CAPS
200.0000 mg | ORAL_CAPSULE | Freq: Once | ORAL | Status: AC
  Administered 2020-08-13: 200 mg via ORAL
  Filled 2020-08-13: qty 2

## 2020-08-13 MED ORDER — ALBUTEROL SULFATE HFA 108 (90 BASE) MCG/ACT IN AERS
8.0000 | INHALATION_SPRAY | Freq: Once | RESPIRATORY_TRACT | Status: AC
  Administered 2020-08-13: 8 via RESPIRATORY_TRACT
  Filled 2020-08-13: qty 6.7

## 2020-08-13 MED ORDER — AEROCHAMBER PLUS FLO-VU SMALL MISC
1.0000 | Freq: Once | Status: AC
  Administered 2020-08-13: 1
  Filled 2020-08-13: qty 1

## 2020-08-13 MED ORDER — BENZONATATE 100 MG PO CAPS: ORAL_CAPSULE | ORAL | 0 refills | Status: DC

## 2020-08-13 NOTE — Discharge Instructions (Addendum)
Thank you for allowing me to care for you today in the Emergency Department.   As we discussed, your chest x-ray did not show any evidence of pneumonia.  You most likely have a viral illness.  We did do a COVID-19 test, but this is pending.  You will receive a call from the hospital if it is positive.  If it is positive, you will need to quarantine at home for a total of 14 days from when your symptoms began.  I have attached additional information on COVID-19 that you can review if your test is positive.  Take 1 to 2 capsules of benzonatate every 8 hours as needed for cough.  Do not take more than 6 capsules in a 24-hour.  Please follow-up with primary care if your symptoms do not significantly improve in the next week.  Return to the emergency department if you pass out, if you develop constant chest pain, respiratory distress, if your fingers or lips turn blue, uncontrollable vomiting, or other new, concerning symptoms.

## 2020-08-13 NOTE — ED Provider Notes (Addendum)
[facility] DEPT Provider Note   CSN: [id number] Arrival date & time: 08/12/20  2016     History Chief Complaint  Patient presents with  . Nasal Congestion  . Cough    Zachary [name] is a 59 y.o. male with a history of chronic right knee pain, periodontal disease, and asthma who presents to the emergency department with a chief complaint of cough.  The patient reports that he has had a productive cough for 3 days.  Sputum is initially green in color, but is now clear.  He reports associated rhinorrhea, nasal congestion, and right ear pain.  He notes that the cough is worse at night and has awoken him from sleep.  He has had some shortness of breath that is worse at night, but denies shortness of breath at this time.  He does report that he has chest pain associated with coughing and the chest pain resolves along with cough.  He reports that he is almost vomited due to the intensity of the cough. He denies fever, chills, leg swelling, palpitations, abdominal pain, nausea, vomiting, diarrhea, loss of sense of taste or smell, sore throat, syncope, dizziness, lightheadedness, headache.  He has taken over-the-counter cough medication with no improvement.  He is unable to find his home albuterol inhaler.  He is fully vaccinated against COVID-19.  No known sick contacts.  The history is provided by the patient. No language interpreter was used.       Past Medical History:  Diagnosis Date  . Asthma   . Knee pain     Patient Active Problem List   Diagnosis Date Noted  . Dermatitis 07/05/2020  . Inguinal hernia of right side without obstruction or gangrene 03/17/2020  . Periodontal disease 03/17/2020  . Dental caries 03/17/2020  . Mild intermittent asthma without complication 20/94/7096  . Gastroesophageal reflux disease without esophagitis 03/17/2020  . Chronic pain of right knee 03/17/2020    Past Surgical History:  Procedure Laterality Date  .  KNEE ARTHROSCOPY     x 2       Family History  Problem Relation Age of Onset  . Hypertension Mother   . Cancer Father   . Cancer Other     Social History   Tobacco Use  . Smoking status: Former Research scientist (life sciences)  . Smokeless tobacco: Never Used  Vaping Use  . Vaping Use: Never used  Substance Use Topics  . Alcohol use: No  . Drug use: No    Home Medications Prior to Admission medications   Medication Sig Start Date End Date Taking? Authorizing Provider  albuterol (VENTOLIN HFA) 108 (90 Base) MCG/ACT inhaler Inhale 2 puffs into the lungs every 2 (two) hours as needed for wheezing or shortness of breath (cough). 03/17/20   Elsie Stain, MD  benzonatate (TESSALON) 100 MG capsule Take 1 to 2 capsules every 8 hours as needed for cough.  Do not take more than 6 capsules in a 24-hour. 08/13/20   Devlynn Knoff A, PA-C  nystatin ointment (MYCOSTATIN) Apply 1 application topically 2 (two) times daily. To area of rash x 10 days then as needed 06/02/20   Fulp, Cammie, MD  triamcinolone cream (KENALOG) 0.1 % Apply 1 application topically 2 (two) times daily. 07/05/20   Elsie Stain, MD  famotidine (PEPCID) 20 MG tablet Take 1 tablet (20 mg total) by mouth 2 (two) times daily. Patient not taking: Reported on 01/09/2019 06/12/18 01/19/20  Orpah Greek, MD  fluticasone Asencion Islam)  50 MCG/ACT nasal spray Place 2 sprays into both nostrils daily. Patient not taking: Reported on 01/09/2019 11/11/17 01/19/20  Carmin Muskrat, MD    Allergies    Patient has no known allergies.  Review of Systems   Review of Systems  Constitutional: Negative for appetite change and fever.  HENT: Positive for congestion and rhinorrhea. Negative for ear discharge, ear pain, facial swelling, mouth sores, nosebleeds, sinus pressure, sore throat and voice change.   Respiratory: Positive for cough. Negative for shortness of breath and wheezing.   Cardiovascular: Positive for chest pain. Negative for palpitations and  leg swelling.  Gastrointestinal: Negative for abdominal pain, constipation, diarrhea, nausea and vomiting.  Genitourinary: Negative for dysuria and urgency.  Musculoskeletal: Negative for back pain, myalgias, neck pain and neck stiffness.  Skin: Negative for rash and wound.  Allergic/Immunologic: Negative for immunocompromised state.  Neurological: Negative for dizziness, seizures, syncope, weakness, numbness and headaches.  Psychiatric/Behavioral: Negative for confusion and dysphoric mood.    Physical Exam Updated Vital Signs BP 136/79 (BP Location: Left Arm)   Pulse 76   Temp 98.1 F (36.7 C) (Oral)   Resp 16   SpO2 94%   Physical Exam Vitals and nursing note reviewed.  Constitutional:      Appearance: He is well-developed.  HENT:     Head: Normocephalic.     Right Ear: Tympanic membrane, ear canal and external ear normal.     Left Ear: Tympanic membrane, ear canal and external ear normal.     Nose: Congestion and rhinorrhea present.     Mouth/Throat:     Mouth: Mucous membranes are moist.     Pharynx: No oropharyngeal exudate or posterior oropharyngeal erythema.  Eyes:     Conjunctiva/sclera: Conjunctivae normal.  Neck:     Comments: No meningismus. Cardiovascular:     Rate and Rhythm: Normal rate and regular rhythm.     Pulses: Normal pulses.     Heart sounds: Normal heart sounds. No murmur heard.  No friction rub. No gallop.   Pulmonary:     Effort: Pulmonary effort is normal. No respiratory distress.     Breath sounds: No stridor. Wheezing present. No rhonchi or rales.     Comments: End expiratory wheezes noted in the bilateral bases.  No rhonchi or rales.  No increased work of breathing.  Lung sounds are not diminished. Chest:     Chest wall: No tenderness.  Abdominal:     General: There is no distension.     Palpations: Abdomen is soft. There is no mass.     Tenderness: There is no abdominal tenderness. There is no right CVA tenderness, left CVA tenderness,  guarding or rebound.     Hernia: No hernia is present.     Comments: Abdomen is soft, nontender, nondistended.  Musculoskeletal:     Cervical back: Normal range of motion and neck supple.     Right lower leg: No edema.     Left lower leg: No edema.     Comments: No peripheral edema.  Skin:    General: Skin is warm and dry.  Neurological:     Mental Status: He is alert.  Psychiatric:        Behavior: Behavior normal.     ED Results / Procedures / Treatments   Labs (all labs ordered are listed, but only abnormal results are displayed) Labs Reviewed  RESP PANEL BY RT-PCR (FLU A&B, COVID) [id number]    EKG None  Radiology DG Chest Portable  1 View  Result Date: 08/12/2020 CLINICAL DATA:  Cough, dyspnea EXAM: PORTABLE CHEST 1 VIEW COMPARISON:  01/26/2020 FINDINGS: The heart size and mediastinal contours are within normal limits. Both lungs are clear. The visualized skeletal structures are unremarkable. IMPRESSION: No active disease. Electronically Signed   By: Fidela Salisbury MD   On: 08/12/2020 23:32    Procedures Procedures (including critical care time)  Medications Ordered in ED Medications  albuterol (VENTOLIN HFA) 108 (90 Base) MCG/ACT inhaler 8 puff (8 puffs Inhalation Given 08/13/20 0018)  AeroChamber Plus Flo-Vu Small device MISC 1 each (1 each Other Given 08/13/20 0018)  benzonatate (TESSALON) capsule 200 mg (200 mg Oral Given 08/13/20 0018)    ED Course  I have reviewed the triage vital signs and the nursing notes.  Pertinent labs & imaging results that were available during my care of the patient were reviewed by me and considered in my medical decision making (see chart for details).    MDM Rules/Calculators/A&P                          59 year old male with a history of chronic right knee pain, periodontal disease, and asthma presenting with a 3-day history of productive cough, nasal congestion, rhinorrhea.  Cough is worse at night and he has awoken several times  while sleeping due to the cough.  No constitutional symptoms.  Vital signs are stable.  On exam, he has end expiratory wheezes noted in the bilateral bases.  There is rhinorrhea and congestion.  No peripheral edema.  He does not appear volume overloaded.  Physical exam is otherwise reassuring.  He is fully vaccinated against COVID-19.  Labs and imaging have been reviewed and independently interpreted by me.  Chest x-ray is unremarkable.  COVID test is pending  He was given albuterol inhaler and Tessalon Perles and on reevaluation patient feels markedly improved.  He has no shortness of breath.  Lungs are clear to auscultation on reexamination.  I have a low suspicion for ACS, PE, community-acquired pneumonia, tension pneumothorax, peritonitis, myocarditis.  Shared decision making conversation with the patient.  He would like to go home since he is feeling much better even though his COVID-19 test is pending.  We discussed quarantine precautions if the test is positive.  He is aware that he will receive a call from the hospital if it is positive.  He will follow up with primary care if symptoms do not significantly improve in a week.  ER return precautions given.  He is hemodynamically stable no acute distress.  Safe for discharge home with outpatient follow-up as indicated.  Final Clinical Impression(s) / ED Diagnoses Final diagnoses:  Viral URI with cough    Rx / DC Orders ED Discharge Orders         Ordered    benzonatate (TESSALON) 100 MG capsule        08/13/20 0121           Kurstyn Larios A, PA-C 08/13/20 0124    Nael Petrosyan A, PA-C 08/13/20 0124    Fatima Blank, MD 08/13/20 2109

## 2020-08-14 ENCOUNTER — Emergency Department (HOSPITAL_COMMUNITY)
Admission: EM | Admit: 2020-08-14 | Discharge: 2020-08-15 | Disposition: A | Discharge status: Home / Self Care | Payer: Self-pay | Attending: Emergency Medicine | Admitting: Emergency Medicine

## 2020-08-14 ENCOUNTER — Other Ambulatory Visit: Payer: Self-pay

## 2020-08-14 ENCOUNTER — Encounter (HOSPITAL_COMMUNITY): Payer: Self-pay | Admitting: Emergency Medicine

## 2020-08-14 DIAGNOSIS — J029 Acute pharyngitis, unspecified: Secondary | ICD-10-CM | POA: Insufficient documentation

## 2020-08-14 DIAGNOSIS — Z7952 Long term (current) use of systemic steroids: Secondary | ICD-10-CM | POA: Insufficient documentation

## 2020-08-14 DIAGNOSIS — R0789 Other chest pain: Secondary | ICD-10-CM | POA: Insufficient documentation

## 2020-08-14 DIAGNOSIS — J4520 Mild intermittent asthma, uncomplicated: Secondary | ICD-10-CM | POA: Insufficient documentation

## 2020-08-14 DIAGNOSIS — R059 Cough, unspecified: Secondary | ICD-10-CM | POA: Insufficient documentation

## 2020-08-14 DIAGNOSIS — Z87891 Personal history of nicotine dependence: Secondary | ICD-10-CM | POA: Insufficient documentation

## 2020-08-14 DIAGNOSIS — R0602 Shortness of breath: Secondary | ICD-10-CM | POA: Insufficient documentation

## 2020-08-14 DIAGNOSIS — R0981 Nasal congestion: Secondary | ICD-10-CM | POA: Insufficient documentation

## 2020-08-14 DIAGNOSIS — R0982 Postnasal drip: Secondary | ICD-10-CM | POA: Insufficient documentation

## 2020-08-14 NOTE — ED Triage Notes (Signed)
Patient seen for same 2 days ago. Patient states that the medications he was given have not helped.

## 2020-08-15 ENCOUNTER — Emergency Department (HOSPITAL_COMMUNITY): Payer: Self-pay

## 2020-08-15 ENCOUNTER — Encounter (HOSPITAL_COMMUNITY): Payer: Self-pay | Admitting: Student

## 2020-08-15 MED ORDER — ALBUTEROL SULFATE HFA 108 (90 BASE) MCG/ACT IN AERS
2.0000 | INHALATION_SPRAY | Freq: Once | RESPIRATORY_TRACT | Status: AC
  Administered 2020-08-15: 2 via RESPIRATORY_TRACT
  Filled 2020-08-15: qty 6.7

## 2020-08-15 MED ORDER — AEROCHAMBER Z-STAT PLUS/MEDIUM MISC
1.0000 | Freq: Once | Status: AC
  Administered 2020-08-15: 1
  Filled 2020-08-15: qty 1

## 2020-08-15 MED ORDER — FLUTICASONE PROPIONATE 50 MCG/ACT NA SUSP: 1.0000 | Freq: Every day | NASAL | 0 refills | Status: DC

## 2020-08-15 MED ORDER — LIDOCAINE VISCOUS HCL 2 % MT SOLN
15.0000 mL | Freq: Once | OROMUCOSAL | Status: AC
  Administered 2020-08-15: 15 mL via OROMUCOSAL
  Filled 2020-08-15: qty 15

## 2020-08-15 MED ORDER — LIDOCAINE VISCOUS HCL 2 % MT SOLN: 15.0000 mL | Freq: Four times a day (QID) | OROMUCOSAL | 0 refills | Status: DC | PRN

## 2020-08-15 NOTE — ED Provider Notes (Signed)
[facility] DEPT Provider Note   CSN: [id number] Arrival date & time: 08/14/20  2248     History Chief Complaint  Patient presents with  . Cough    Zachary [name] is a 59 y.o. male with a history of asthma and GERD who presents to the ED with complaints of persistent cough x 1 week. Patient states he has had sxs of nasal congestion, rhinorrhea, sore throat, productive cough with clear phlegm sputum, & feels short of breath with coughing spells. States his throat and chest feel like they are burning, [name] other chest pain. Seen in the ED and given tessalon which he feels is not helping. He denies fever, chills, wheezing, abdominal pain, nausea, vomiting, syncope, leg pain/swelling, hemoptysis, recent surgery/trauma, recent long travel, hormone use, personal hx of cancer, or hx of DVT/PE.   HPI     Past Medical History:  Diagnosis Date  . Asthma   . Knee pain     Patient Active Problem List   Diagnosis Date Noted  . Dermatitis 07/05/2020  . Inguinal hernia of right side without obstruction or gangrene 03/17/2020  . Periodontal disease 03/17/2020  . Dental caries 03/17/2020  . Mild intermittent asthma without complication [id number]  . Gastroesophageal reflux disease without esophagitis 03/17/2020  . Chronic pain of right knee 03/17/2020    Past Surgical History:  Procedure Laterality Date  . KNEE ARTHROSCOPY     x 2       Family History  Problem Relation Age of Onset  . Hypertension Mother   . Cancer Father   . Cancer Other     Social History   Tobacco Use  . Smoking status: Former Research scientist (life sciences)  . Smokeless tobacco: Never Used  Vaping Use  . Vaping Use: Never used  Substance Use Topics  . Alcohol use: [name]  . Drug use: [name]    Home Medications Prior to Admission medications   Medication Sig Start Date End Date Taking? Authorizing Provider  albuterol (VENTOLIN HFA) 108 (90 Base) MCG/ACT inhaler Inhale 2 puffs into the lungs every 2  (two) hours as needed for wheezing or shortness of breath (cough). 03/17/20   Elsie Stain, MD  benzonatate (TESSALON) 100 MG capsule Take 1 to 2 capsules every 8 hours as needed for cough.  Do not take more than 6 capsules in a 24-hour. 08/13/20   McDonald, Mia A, PA-C  nystatin ointment (MYCOSTATIN) Apply 1 application topically 2 (two) times daily. To area of rash x 10 days then as needed 06/02/20   Fulp, Cammie, MD  triamcinolone cream (KENALOG) 0.1 % Apply 1 application topically 2 (two) times daily. 07/05/20   Elsie Stain, MD  famotidine (PEPCID) 20 MG tablet Take 1 tablet (20 mg total) by mouth 2 (two) times daily. Patient not taking: Reported on 01/09/2019 06/12/18 01/19/20  Orpah Greek, MD  fluticasone [facility]) 50 MCG/ACT nasal spray Place 2 sprays into both nostrils daily. Patient not taking: Reported on 01/09/2019 11/11/17 01/19/20  Carmin Muskrat, MD    Allergies    Patient has [name] known allergies.  Review of Systems   Review of Systems  Constitutional: Negative for chills and fever.  HENT: Positive for congestion, rhinorrhea and sore throat.   Respiratory: Positive for cough and shortness of breath. Negative for wheezing.   Cardiovascular: Negative for leg swelling.  Gastrointestinal: Negative for abdominal pain, diarrhea, nausea and vomiting.  Neurological: Negative for syncope.  All other systems reviewed and are negative.  Physical Exam Updated Vital Signs BP 139/88 (BP Location: Left Arm)   Pulse 74   Temp 98.2 F (36.8 C) (Oral)   Resp 14   Ht 5\' 6"  (1.676 m)   Wt 68 kg   SpO2 96%   BMI 24.21 kg/m   Physical Exam Vitals and nursing note reviewed.  Constitutional:      General: He is not in acute distress.    Appearance: He is well-developed. He is not toxic-appearing.  HENT:     Head: Normocephalic and atraumatic.     Right Ear: Ear canal normal. Tympanic membrane is not perforated, erythematous, retracted or bulging.     Left Ear: Ear  canal normal. Tympanic membrane is not perforated, erythematous, retracted or bulging.     Ears:     Comments: [name] mastoid erythema/swellng/tenderness.     Nose: Congestion present.     Right Sinus: [name] maxillary sinus tenderness or frontal sinus tenderness.     Left Sinus: [name] maxillary sinus tenderness or frontal sinus tenderness.     Mouth/Throat:     Pharynx: Oropharynx is clear. Uvula midline. [name] oropharyngeal exudate or posterior oropharyngeal erythema.     Comments: Posterior oropharynx is symmetric appearing. Patient tolerating own secretions without difficulty. [name] trismus. [name] drooling. [name] hot potato voice. [name] swelling beneath the tongue, submandibular compartment is soft.  Eyes:     General:        Right eye: [name] discharge.        Left eye: [name] discharge.     Conjunctiva/sclera: Conjunctivae normal.  Cardiovascular:     Rate and Rhythm: Normal rate and regular rhythm.  Pulmonary:     Effort: Pulmonary effort is normal. [name] respiratory distress.     Breath sounds: Normal breath sounds. [name] wheezing, rhonchi or rales.  Chest:     Chest wall: Tenderness (anterior chest wall) present.  Abdominal:     General: There is [name] distension.     Palpations: Abdomen is soft.     Tenderness: There is [name] abdominal tenderness.  Musculoskeletal:     Cervical back: Neck supple. [name] rigidity.  Lymphadenopathy:     Cervical: [name] cervical adenopathy.  Skin:    General: Skin is warm and dry.     Findings: [name] rash.  Neurological:     Mental Status: He is alert.  Psychiatric:        Behavior: Behavior normal.     ED Results / Procedures / Treatments   Labs (all labs ordered are listed, but only abnormal results are displayed) Labs Reviewed - [name] data to display  EKG None  Radiology DG Chest 2 View  Result Date: 08/15/2020 CLINICAL DATA:  Cough EXAM: CHEST - 2 VIEW COMPARISON:  08/12/2020 FINDINGS: The heart size and mediastinal contours are within normal limits. Both lungs are clear. The  visualized skeletal structures are unremarkable. IMPRESSION: [name] active cardiopulmonary disease. Electronically Signed   By: Ulyses Jarred M.D.   On: 08/15/2020 02:15    Procedures Procedures (including critical care time)  Medications Ordered in ED Medications - [name] data to display  ED Course  I have reviewed the triage vital signs and the nursing notes.  Pertinent labs & imaging results that were available during my care of the patient were reviewed by me and considered in my medical decision making (see chart for details).    MDM Rules/Calculators/A&P  Patient presents to the ED with complaints of continued cough.  Nontoxic, vitals WNL.   Additional history obtained:  Additional history obtained from chart review & nursing note review.  Covid and influenza testing at last visit negative.  Imaging Studies ordered:  I ordered imaging studies which included CXR, I independently visualized and interpreted imaging which showed [name] acute process.   ED Course:  Exam is without signs of AOM, AOE, or mastoiditis. Oropharyngeal exam is benign, centor score is not elevated, not consistent w/ RPA/PTA.Marland Kitchen [name] sinus tenderness. [name] meningeal signs. Lungs are CTA without focal adventitious sounds, [name] signs of increased work of breathing, CXR without infiltrate, doubt CAP.  Chest x-ray is also without pneumothorax or fluid overload.  [name] wheezing on exam, do not suspect acute asthma exacerbation requiring steroids.  Low risk Wells, doubt pulmonary embolism.  Describes a burning in his throat/chest, chest pain is reproducible with anterior chest wall palpation, doubt ACS.  Overall suspect viral versus allergic.  Will treat supportively. I discussed results, treatment plan, need for follow-up, and return precautions with the patient. Provided opportunity for questions, patient confirmed understanding and is in agreement with plan.   Portions of this note were generated with Geographical information systems officer. Dictation errors may occur despite best attempts at proofreading.   Final Clinical Impression(s) / ED Diagnoses Final diagnoses:  Cough    Rx / DC Orders ED Discharge Orders         Ordered    fluticasone (FLONASE) 50 MCG/ACT nasal spray  Daily        08/15/20 0248    lidocaine (XYLOCAINE) 2 % solution  Every 6 hours PRN        08/15/20 0248           Amaryllis Dyke, PA-C 44/92/01 0071    Delora Fuel, MD 21/97/58 [phone]

## 2020-08-15 NOTE — Discharge Instructions (Addendum)
You were seen in the emergency department today for continued cough.  Your chest x-ray does not show pneumonia.  We suspect your symptoms are related to a virus or allergies at this point.  Please continue to take Tessalon as needed for coughing.  We are also sending you home with viscous lidocaine to take 15 mL every 6 hours as needed for burning pain or coughing.  We are also sending you home with Flonase to use 1 spray per nostril daily as needed for nasal congestion.  We are sending you home with a new inhaler with a spacer to use 1 to 2 puffs every 4-6 hours as needed for chest tightness, shortness breath, or wheezing.  We have prescribed you new medication(s) today. Discuss the medications prescribed today with your pharmacist as they can have adverse effects and interactions with your other medicines including over the counter and prescribed medications. Seek medical evaluation if you start to experience new or abnormal symptoms after taking one of these medicines, seek care immediately if you start to experience difficulty breathing, feeling of your throat closing, facial swelling, or rash as these could be indications of a more serious allergic reaction  Please follow with your primary care provider within 3 days.  Return to the ER for new or worsening symptoms including but not limited to new/worse pain, coughing up blood, trouble breathing, passing out, fever, or any other concerns.

## 2020-09-12 ENCOUNTER — Emergency Department (HOSPITAL_COMMUNITY)
Admission: EM | Admit: 2020-09-12 | Discharge: 2020-09-13 | Disposition: A | Discharge status: Home / Self Care | Payer: HRSA Program | Attending: Emergency Medicine | Admitting: Emergency Medicine

## 2020-09-12 ENCOUNTER — Other Ambulatory Visit: Payer: Self-pay

## 2020-09-12 ENCOUNTER — Encounter (HOSPITAL_COMMUNITY): Payer: Self-pay | Admitting: Emergency Medicine

## 2020-09-12 DIAGNOSIS — J4520 Mild intermittent asthma, uncomplicated: Secondary | ICD-10-CM | POA: Insufficient documentation

## 2020-09-12 DIAGNOSIS — Z87891 Personal history of nicotine dependence: Secondary | ICD-10-CM | POA: Diagnosis not present

## 2020-09-12 DIAGNOSIS — U071 COVID-19: Secondary | ICD-10-CM | POA: Diagnosis not present

## 2020-09-12 DIAGNOSIS — R519 Headache, unspecified: Secondary | ICD-10-CM | POA: Diagnosis present

## 2020-09-12 DIAGNOSIS — K4090 Unilateral inguinal hernia, without obstruction or gangrene, not specified as recurrent: Secondary | ICD-10-CM | POA: Insufficient documentation

## 2020-09-12 NOTE — ED Triage Notes (Signed)
Patient c/o congestion last week and headache this week. States existing right inguinal hernia has increased in size in the last month.

## 2020-09-13 LAB — POC SARS CORONAVIRUS 2 AG -  ED: SARS Coronavirus 2 Ag: POSITIVE — AB

## 2020-09-13 MED ORDER — DEXAMETHASONE 4 MG PO TABS
10.0000 mg | ORAL_TABLET | Freq: Once | ORAL | Status: AC
  Administered 2020-09-13: 10 mg via ORAL
  Filled 2020-09-13: qty 1

## 2020-09-13 NOTE — Discharge Instructions (Signed)
Your test for COVID is positive tonight. You can be discharged home because you are safe to treat this at home. Please read the information provided in the discharge packet on COVID-19, how to be safe at home and how to help protect others from infection.  Your information has been given to our [facility] program and to the outpatient MAB (antibody treatment) clinic, who will contact you regarding additional care and treatments.

## 2020-09-13 NOTE — ED Provider Notes (Signed)
[facility]-EMERGENCY DEPT Provider Note   CSN: [id number] Arrival date & time: 09/12/20  1538     History Chief Complaint  Patient presents with  . Headache    Zachary [name] is a 60 y.o. male.  Patient to ED for evaluation of headache, cough, congestion x several days. He also reports that his known inguinal right hernia is increasing in size. He continues to have regular bowel movements. He denies significant pain associated with the hernia. No fever, vomiting, bloody stools or abdominal pain.   The history is provided by the patient. No language interpreter was used.  Headache Associated symptoms: congestion, cough, fatigue and myalgias   Associated symptoms: no abdominal pain, no fever, no vomiting and no weakness        Past Medical History:  Diagnosis Date  . Asthma   . Knee pain     Patient Active Problem List   Diagnosis Date Noted  . Dermatitis 07/05/2020  . Inguinal hernia of right side without obstruction or gangrene 03/17/2020  . Periodontal disease 03/17/2020  . Dental caries 03/17/2020  . Mild intermittent asthma without complication 03/17/2020  . Gastroesophageal reflux disease without esophagitis 03/17/2020  . Chronic pain of right knee 03/17/2020    Past Surgical History:  Procedure Laterality Date  . KNEE ARTHROSCOPY     x 2       Family History  Problem Relation Age of Onset  . Hypertension Mother   . Cancer Father   . Cancer Other     Social History   Tobacco Use  . Smoking status: Former Games developer  . Smokeless tobacco: Never Used  Vaping Use  . Vaping Use: Never used  Substance Use Topics  . Alcohol use: No  . Drug use: No    Home Medications Prior to Admission medications   Medication Sig Start Date End Date Taking? Authorizing Provider  albuterol (VENTOLIN HFA) 108 (90 Base) MCG/ACT inhaler Inhale 2 puffs into the lungs every 2 (two) hours as needed for wheezing or shortness of breath (cough). 03/17/20    Storm Frisk, MD  benzonatate (TESSALON) 100 MG capsule Take 1 to 2 capsules every 8 hours as needed for cough.  Do not take more than 6 capsules in a 24-hour. 08/13/20   McDonald, Mia A, PA-C  fluticasone (FLONASE) 50 MCG/ACT nasal spray Place 1 spray into both nostrils daily. 08/15/20   Petrucelli, Samantha R, PA-C  lidocaine (XYLOCAINE) 2 % solution Use as directed 15 mLs in the mouth or throat every 6 (six) hours as needed for mouth pain (sore throat/burning, coughing). 08/15/20   Petrucelli, Samantha R, PA-C  nystatin ointment (MYCOSTATIN) Apply 1 application topically 2 (two) times daily. To area of rash x 10 days then as needed 06/02/20   Fulp, Cammie, MD  triamcinolone cream (KENALOG) 0.1 % Apply 1 application topically 2 (two) times daily. 07/05/20   Storm Frisk, MD  famotidine (PEPCID) 20 MG tablet Take 1 tablet (20 mg total) by mouth 2 (two) times daily. Patient not taking: Reported on 01/09/2019 06/12/18 01/19/20  Gilda Crease, MD    Allergies    Patient has no known allergies.  Review of Systems   Review of Systems  Constitutional: Positive for chills and fatigue. Negative for activity change, appetite change and fever.  HENT: Positive for congestion.   Respiratory: Positive for cough. Negative for shortness of breath.   Cardiovascular: Negative.  Negative for chest pain.  Gastrointestinal: Negative for abdominal  pain, blood in stool, constipation and vomiting.  Genitourinary: Negative.   Musculoskeletal: Positive for myalgias.  Skin: Negative.  Negative for color change and rash.  Neurological: Positive for headaches. Negative for weakness.    Physical Exam Updated Vital Signs BP (!) 146/89   Pulse 76   Temp 98.7 F (37.1 C) (Oral)   Resp 16   Ht 5\' 6"  (1.676 m)   Wt 68 kg   SpO2 96%   BMI 24.21 kg/m   Physical Exam Constitutional:      Appearance: He is well-developed and well-nourished.  HENT:     Head: Normocephalic.  Cardiovascular:      Rate and Rhythm: Normal rate and regular rhythm.  Pulmonary:     Effort: Pulmonary effort is normal.     Breath sounds: Normal breath sounds. No wheezing, rhonchi or rales.  Abdominal:     General: Bowel sounds are normal.     Palpations: Abdomen is soft.     Tenderness: There is no abdominal tenderness. There is no guarding or rebound.     Comments: Right inguinal hernia is soft, nontender.   Musculoskeletal:        General: Normal range of motion.     Cervical back: Normal range of motion and neck supple.  Skin:    General: Skin is warm and dry.     Findings: No rash.  Neurological:     Mental Status: He is alert and oriented to person, place, and time.     GCS: GCS eye subscore is 4. GCS verbal subscore is 5. GCS motor subscore is 6.     Cranial Nerves: No dysarthria.     Sensory: No sensory deficit.     Motor: No weakness.     Coordination: Coordination normal.     Gait: Gait normal.  Psychiatric:        Mood and Affect: Mood and affect and mood normal.     ED Results / Procedures / Treatments   Labs (all labs ordered are listed, but only abnormal results are displayed) Labs Reviewed  POC SARS CORONAVIRUS 2 AG -  ED    EKG None  Radiology No results found.  Procedures Procedures (including critical care time)  Medications Ordered in ED Medications - No data to display  ED Course  I have reviewed the triage vital signs and the nursing notes.  Pertinent labs & imaging results that were available during my care of the patient were reviewed by me and considered in my medical decision making (see chart for details).    MDM Rules/Calculators/A&P                          Patient to ED with ss/sxs as per HPI.   He is overall well appearing. VSS, no hypoxia or tachycardia. Lung exam without concern. He is COVID vaccinated (02/2020). With symptoms of COVID a test was performed and found to be positive. Decadron provided. He is a candidate for discharge home. Will  send information to the [facility] program and to the [facility] infusion clinic.   He is encouraged to work with his PCP for outpatient management of the inguinal hernia. No evidence of incarceration tonight, no emergent treatment felt indicated.   Final Clinical Impression(s) / ED Diagnoses Final diagnoses:  None   1. COVID  2. Right inguinal hernia without incarceration  Rx / DC Orders ED Discharge Orders    None  Charlann Lange, PA-C 09/13/20 0036    Molpus, Jenny Reichmann, MD 09/13/20 [phone]

## 2020-09-14 ENCOUNTER — Encounter: Payer: Self-pay | Admitting: Infectious Diseases

## 2020-09-14 NOTE — Progress Notes (Signed)
Called to discuss with patient about COVID-19 symptoms and the use of one of the available treatments for those with mild to moderate Covid symptoms and at a high risk of hospitalization.  Pt appears to qualify for outpatient treatment due to co-morbid conditions and/or a member of an at-risk group in accordance with the FDA Emergency Use Authorization.    Symptom onset: "Several days" - cough and congestion and headache on 09/12/20 encounter  Vaccinated: Yes with last dose Pfizer 03/29/2020 Qualifiers: Elevated SVI, mild intermittent asthma  Patient was not contacted, chart review conducted.  He has received primary vaccination in the last 6 months and not many medical qualifiers/risk predictors listed. Due to medication shortages we cannot offer the pt treatment at this time. This decision was based on clinical judgement as well as the the NIH Covid 19 treatment guidelines for treatment prioritization and equitable access.   Rexene Alberts, NP  09/14/2020 11:17 AM

## 2020-09-15 ENCOUNTER — Ambulatory Visit: Payer: HRSA Program | Attending: Critical Care Medicine | Admitting: Critical Care Medicine

## 2020-09-15 ENCOUNTER — Encounter: Payer: Self-pay | Admitting: Critical Care Medicine

## 2020-09-15 ENCOUNTER — Other Ambulatory Visit: Payer: Self-pay

## 2020-09-15 VITALS — BP 130/89 | HR 59 | Temp 98.5°F | Resp 16

## 2020-09-15 DIAGNOSIS — U071 COVID-19: Secondary | ICD-10-CM

## 2020-09-15 DIAGNOSIS — K4090 Unilateral inguinal hernia, without obstruction or gangrene, not specified as recurrent: Secondary | ICD-10-CM

## 2020-09-15 HISTORY — DX: COVID-19: U07.1

## 2020-09-15 MED ORDER — ALBUTEROL SULFATE HFA 108 (90 BASE) MCG/ACT IN AERS: 2.0000 | INHALATION_SPRAY | Freq: Four times a day (QID) | RESPIRATORY_TRACT | 0 refills | Status: DC | PRN

## 2020-09-15 NOTE — Assessment & Plan Note (Signed)
Inguinal hernia stable we will have him see our general surgeon who volunteers at our clinic to evaluate

## 2020-09-15 NOTE — Assessment & Plan Note (Signed)
Covid viral infection which is a breakthrough infection as the patient has been vaccinated previously but yet to receive a booster  Patient is rapidly improving and does not require any other interventions other than supportive care  Patient recommended to begin vitamin D vitamin C and zinc supplementation  Patient to use albuterol as needed and benzonatate as needed for cough

## 2020-09-15 NOTE — Patient Instructions (Signed)
Take albuterol 2 puffs every 6 hours as needed for shortness of breath  Start vitamin D 1000 units daily and vitamin C 500 mg daily  You can use the benzonatate that was sent to your pharmacy as needed for cough  Hydrate yourself   Keep in isolation for 7 days  An appointment with Dr. Lindie Spruce to evaluate your hernia will be made in the next month  We will give you in financial assistance application to apply for the orange and blue card for dental appointment  Return to Dr. Delford Field 2 months

## 2020-09-15 NOTE — Progress Notes (Signed)
Subjective:    Patient ID: Zachary [name], male    DOB: 06-15-1961, 60 y.o.   MRN: [id number]  03/17/20 This is a pleasant 60 year old male who had been seen in the emergency room for chest discomfort in late May.  Work-up for cardiac disorder was negative.  Chest x-ray negative.  Patient was given prescription for Protonix and he states this is resolved his chest pain.  He also has history of mild intermittent asthma and had an albuterol inhaler in the past but no longer has this.  Patient also complains of a right inguinal hernia but does not have current pain from this.  No prior history of known hypertension or diabetes.  No other significant complaints at this time.  Patient does have prior history of right knee pain with prior arthroscopy knee pain is tolerable at this moment.  Patient does have dental caries and has remaining teeth in the lower front jaw and the left posterior jaw that need to be removed other teeth been extracted  07/05/2020 This patient is seen in return follow-up and was seen recently for tinea cruris which has resolved using antifungal therapy.  He states he does have occasional chest tightness but shortness of breath is at baseline with his asthma.  He complains that his right inguinal hernia is slightly larger.  He is yet to see a dentist for his severe dental caries.  He states proton pump inhibitor does help his reflux symptoms.  He does wish to avoid a flu vaccine at this time.  However he is up-to-date on his Covid vaccine.  His frequent urination situation has resolved itself and he does not have diabetes on blood screen  At the last visit colon cancer screen was negative and HCV was negative  09/15/2020 This patient comes in for a face-to-face visit after having been to the emergency room over the [place] holiday with symptoms of fever cough shortness of breath muscle aches and pains Patient has history of asthma dental caries periodontal disease inguinal  hernia on the right without obstruction  Patient was found to be Covid positive at the urgent care and he is here today for return follow-up  He states his symptoms are rapidly improving and on arrival he is afebrile and oxygen saturation stable The patient was seen in full PPE protection per office Covid protocol  Past Medical History:  Diagnosis Date  . Asthma   . Knee pain      Family History  Problem Relation Age of Onset  . Hypertension Mother   . Cancer Father   . Cancer Other      Social History   Socioeconomic History  . Marital status: Single    Spouse name: Not on file  . Number of children: Not on file  . Years of education: Not on file  . Highest education level: Not on file  Occupational History  . Not on file  Tobacco Use  . Smoking status: Former Research scientist (life sciences)  . Smokeless tobacco: Never Used  Vaping Use  . Vaping Use: Never used  Substance and Sexual Activity  . Alcohol use: No  . Drug use: No  . Sexual activity: Yes  Other Topics Concern  . Not on file  Social History Narrative  . Not on file   Social Determinants of Health   Financial Resource Strain: Not on file  Food Insecurity: Not on file  Transportation Needs: Not on file  Physical Activity: Not on file  Stress: Not  on file  Social Connections: Not on file  Intimate Partner Violence: Not on file     No Known Allergies   Outpatient Medications Prior to Visit  Medication Sig Dispense Refill  . benzonatate (TESSALON) 100 MG capsule Take 1 to 2 capsules every 8 hours as needed for cough.  Do not take more than 6 capsules in a 24-hour. 21 capsule 0  . nystatin ointment (MYCOSTATIN) Apply 1 application topically 2 (two) times daily. To area of rash x 10 days then as needed 60 g 0  . triamcinolone cream (KENALOG) 0.1 % Apply 1 application topically 2 (two) times daily. 30 g 0  . albuterol (VENTOLIN HFA) 108 (90 Base) MCG/ACT inhaler Inhale 2 puffs into the lungs every 2 (two) hours as needed for  wheezing or shortness of breath (cough). 18 g 0  . azithromycin (ZITHROMAX) 250 MG tablet Take 250 mg by mouth as directed.    . fluticasone (FLONASE) 50 MCG/ACT nasal spray Place 1 spray into both nostrils daily. 16 g 0  . lidocaine (XYLOCAINE) 2 % solution Use as directed 15 mLs in the mouth or throat every 6 (six) hours as needed for mouth pain (sore throat/burning, coughing). 100 mL 0  . predniSONE (DELTASONE) 20 MG tablet Take 20 mg by mouth daily.     No facility-administered medications prior to visit.       Review of Systems  Constitutional: Negative for fever.  HENT: Negative for dental problem, ear discharge, ear pain, postnasal drip, rhinorrhea, sinus pressure, sinus pain, sneezing, sore throat and trouble swallowing.   Eyes: Negative.   Respiratory: Positive for chest tightness. Negative for cough, choking, shortness of breath, wheezing and stridor.   Cardiovascular: Negative for chest pain.  Gastrointestinal: Negative.   Endocrine: Negative for polydipsia, polyphagia and polyuria.  Genitourinary: Negative.   Musculoskeletal: Negative for arthralgias, back pain, gait problem, joint swelling, myalgias, neck pain and neck stiffness.       Right knee surgery , chronic pain R knee  Skin: Negative.   Neurological: Negative.   Hematological: Negative.   Psychiatric/Behavioral: Negative.        Objective:   Physical Exam Vitals:   09/15/20 1141  BP: 130/89  Pulse: (!) 59  Resp: 16  Temp: 98.5 F (36.9 C)  SpO2: 97%    Gen: Pleasant, well-nourished, in no distress,  normal affect  ENT: Poor dentition is seen in the lower jaw area,  mouth clear,  oropharynx clear, no postnasal drip  Neck: No JVD, no TMG, no carotid bruits  Lungs: No use of accessory muscles, no dullness to percussion, clear without rales or rhonchi  Cardiovascular: RRR, heart sounds normal, no murmur or gallops, no peripheral edema  Abdomen: soft and NT, no HSM,  BS normal   right inguinal  hernia is slightly enlarged but is not reducible but does not appear to contain any bowel  Musculoskeletal: No deformities, no cyanosis or clubbing  Neuro: alert, non focal  Skin: Warm, chronic dermatitis of the posterior scalp and neck is noted    Assessment & Plan:  I personally reviewed all images and lab data in the [facility] system as well as any outside material available during this office visit and agree with the  radiology impressions.   COVID-19 virus infection Covid viral infection which is a breakthrough infection as the patient has been vaccinated previously but yet to receive a booster  Patient is rapidly improving and does not require any other interventions other than  supportive care  Patient recommended to begin vitamin D vitamin C and zinc supplementation  Patient to use albuterol as needed and benzonatate as needed for cough    Inguinal hernia of right side without obstruction or gangrene Inguinal hernia stable we will have him see our general surgeon who volunteers at our clinic to evaluate   Diagnoses and all orders for this visit:  COVID-19 virus infection  Inguinal hernia of right side without obstruction or gangrene  Other orders -     albuterol (VENTOLIN HFA) 108 (90 Base) MCG/ACT inhaler; Inhale 2 puffs into the lungs every 6 (six) hours as needed for wheezing or shortness of breath (cough).

## 2020-10-14 ENCOUNTER — Ambulatory Visit: Payer: Self-pay | Admitting: General Surgery

## 2020-11-04 ENCOUNTER — Ambulatory Visit: Payer: Self-pay | Admitting: General Surgery

## 2020-11-13 DIAGNOSIS — Z8616 Personal history of COVID-19: Secondary | ICD-10-CM | POA: Insufficient documentation

## 2020-11-13 DIAGNOSIS — J4520 Mild intermittent asthma, uncomplicated: Secondary | ICD-10-CM | POA: Insufficient documentation

## 2020-11-13 DIAGNOSIS — R0789 Other chest pain: Secondary | ICD-10-CM | POA: Insufficient documentation

## 2020-11-13 DIAGNOSIS — Z87891 Personal history of nicotine dependence: Secondary | ICD-10-CM | POA: Insufficient documentation

## 2020-11-14 ENCOUNTER — Encounter (HOSPITAL_COMMUNITY): Payer: Self-pay | Admitting: Emergency Medicine

## 2020-11-14 ENCOUNTER — Emergency Department (HOSPITAL_COMMUNITY): Payer: Self-pay

## 2020-11-14 ENCOUNTER — Emergency Department (HOSPITAL_COMMUNITY)
Admission: EM | Admit: 2020-11-14 | Discharge: 2020-11-14 | Disposition: A | Discharge status: Home / Self Care | Payer: Self-pay | Attending: Emergency Medicine | Admitting: Emergency Medicine

## 2020-11-14 DIAGNOSIS — R0789 Other chest pain: Secondary | ICD-10-CM

## 2020-11-14 LAB — CBC
HCT: 41.1 % (ref 39.0–52.0)
Hemoglobin: 13.9 g/dL (ref 13.0–17.0)
MCH: 30.6 pg (ref 26.0–34.0)
MCHC: 33.8 g/dL (ref 30.0–36.0)
MCV: 90.5 fL (ref 80.0–100.0)
Platelets: 250 10*3/uL (ref 150–400)
RBC: 4.54 MIL/uL (ref 4.22–5.81)
RDW: 12.5 % (ref 11.5–15.5)
WBC: 5 10*3/uL (ref 4.0–10.5)
nRBC: 0 % (ref 0.0–0.2)

## 2020-11-14 LAB — TROPONIN I (HIGH SENSITIVITY)
Troponin I (High Sensitivity): 3 ng/L (ref <18)
Troponin I (High Sensitivity): 5 ng/L (ref <18)

## 2020-11-14 LAB — BASIC METABOLIC PANEL
Anion gap: 8 (ref 5–15)
BUN: 12 mg/dL (ref 6–20)
CO2: 27 mmol/L (ref 22–32)
Calcium: 9.6 mg/dL (ref 8.9–10.3)
Chloride: 102 mmol/L (ref 98–111)
Creatinine, Ser: 0.87 mg/dL (ref 0.61–1.24)
GFR, Estimated: >60 mL/min (ref >60)
Glucose, Bld: 139 mg/dL — ABNORMAL HIGH (ref 70–99)
Potassium: 3.5 mmol/L (ref 3.5–5.1)
Sodium: 137 mmol/L (ref 135–145)

## 2020-11-14 MED ORDER — CYCLOBENZAPRINE HCL 10 MG PO TABS: 10.0000 mg | ORAL_TABLET | Freq: Three times a day (TID) | ORAL | 0 refills | Status: DC | PRN

## 2020-11-14 MED ORDER — FAMOTIDINE 20 MG PO TABS: 20.0000 mg | ORAL_TABLET | Freq: Two times a day (BID) | ORAL | 0 refills | Status: DC

## 2020-11-14 NOTE — ED Triage Notes (Signed)
Pt c/o L sided chest heaviness, L arm heaviness, sinus headache, burning in esophagus and stomach while eating and drinking. +nausea.

## 2020-11-14 NOTE — Progress Notes (Unsigned)
Subjective:    Patient ID: Zachary [name], male    DOB: November 30, 1960, 60 y.o.   MRN: [id number]  03/17/20 This is a pleasant 60 year old male who had been seen in the emergency room for chest discomfort in late May.  Work-up for cardiac disorder was negative.  Chest x-ray negative.  Patient was given prescription for Protonix and he states this is resolved his chest pain.  He also has history of mild intermittent asthma and had an albuterol inhaler in the past but no longer has this.  Patient also complains of a right inguinal hernia but does not have current pain from this.  No prior history of known hypertension or diabetes.  No other significant complaints at this time.  Patient does have prior history of right knee pain with prior arthroscopy knee pain is tolerable at this moment.  Patient does have dental caries and has remaining teeth in the lower front jaw and the left posterior jaw that need to be removed other teeth been extracted  07/05/2020 This patient is seen in return follow-up and was seen recently for tinea cruris which has resolved using antifungal therapy.  He states he does have occasional chest tightness but shortness of breath is at baseline with his asthma.  He complains that his right inguinal hernia is slightly larger.  He is yet to see a dentist for his severe dental caries.  He states proton pump inhibitor does help his reflux symptoms.  He does wish to avoid a flu vaccine at this time.  However he is up-to-date on his Covid vaccine.  His frequent urination situation has resolved itself and he does not have diabetes on blood screen  At the last visit colon cancer screen was negative and HCV was negative  09/15/2020 This patient comes in for a face-to-face visit after having been to the emergency room over the [place] holiday with symptoms of fever cough shortness of breath muscle aches and pains Patient has history of asthma dental caries periodontal disease inguinal  hernia on the right without obstruction  Patient was found to be Covid positive at the urgent care and he is here today for return follow-up  He states his symptoms are rapidly improving and on arrival he is afebrile and oxygen saturation stable The patient was seen in full PPE protection per office Covid protocol  11/15/20 Patient was seen in return follow-up complaining of nasal congestion drainage and sinus pressure  He otherwise is completely resolved all of his COVID-19 viral infection symptoms Patient did go to the emergency room yesterday because of chest pain after heavy lifting.  EKG and troponins were negative and he was thought to have musculoskeletal pain anterior chest wall.  He states the pain is better today.  No other complaints at this time.  He does have severe dental caries and wishes a dental referral  Patient with inguinal hernia has upcoming appointment with surgery  Past Medical History:  Diagnosis Date  . Asthma   . COVID-19 virus infection 09/15/2020  . Knee pain      Family History  Problem Relation Age of Onset  . Hypertension Mother   . Cancer Father   . Cancer Other      Social History   Socioeconomic History  . Marital status: Single    Spouse name: Not on file  . Number of children: Not on file  . Years of education: Not on file  . Highest education level: Not on file  Occupational History  . Not on file  Tobacco Use  . Smoking status: Former Research scientist (life sciences)  . Smokeless tobacco: Never Used  Vaping Use  . Vaping Use: Never used  Substance and Sexual Activity  . Alcohol use: No  . Drug use: No  . Sexual activity: Yes  Other Topics Concern  . Not on file  Social History Narrative  . Not on file   Social Determinants of Health   Financial Resource Strain: Not on file  Food Insecurity: Not on file  Transportation Needs: Not on file  Physical Activity: Not on file  Stress: Not on file  Social Connections: Not on file  Intimate Partner  Violence: Not on file     No Known Allergies   Outpatient Medications Prior to Visit  Medication Sig Dispense Refill  . albuterol (VENTOLIN HFA) 108 (90 Base) MCG/ACT inhaler Inhale 2 puffs into the lungs every 6 (six) hours as needed for wheezing or shortness of breath (cough). 18 g 0  . cyclobenzaprine (FLEXERIL) 10 MG tablet Take 1 tablet (10 mg total) by mouth 3 (three) times daily as needed for muscle spasms. 20 tablet 0  . famotidine (PEPCID) 20 MG tablet Take 1 tablet (20 mg total) by mouth 2 (two) times daily. (Patient taking differently: Take 20 mg by mouth daily as needed.) 60 tablet 0  . vitamin B-12 (CYANOCOBALAMIN) 1000 MCG tablet Take 1,000 mcg by mouth daily.    . vitamin C (ASCORBIC ACID) 500 MG tablet Take 500 mg by mouth daily.    Marland Kitchen Dextromethorphan-guaiFENesin (MUCINEX DM PO) Take 1 Dose by mouth 2 (two) times daily as needed (cough/congestion). (Patient not taking: Reported on 11/15/2020)     No facility-administered medications prior to visit.       Review of Systems  Constitutional: Negative for fever.  HENT: Positive for rhinorrhea, sinus pressure, sneezing and sore throat. Negative for dental problem, ear discharge, ear pain, postnasal drip, sinus pain and trouble swallowing.   Eyes: Negative.   Respiratory: Negative for cough, choking, chest tightness, shortness of breath, wheezing and stridor.   Cardiovascular: Negative for chest pain.  Gastrointestinal: Negative.   Endocrine: Negative for polydipsia, polyphagia and polyuria.  Genitourinary: Negative.   Musculoskeletal: Negative for arthralgias, back pain, gait problem, joint swelling, myalgias, neck pain and neck stiffness.       Right knee surgery , chronic pain R knee  Skin: Negative.   Neurological: Positive for headaches.  Hematological: Negative.   Psychiatric/Behavioral: Negative.        Objective:   Physical Exam Vitals:   11/15/20 0948  BP: 117/77  Pulse: 64  SpO2: 97%  Weight: 143 lb 9.6  oz (65.1 kg)  Height: 5\' 6"  (1.676 m)    Gen: Pleasant, well-nourished, in no distress,  normal affect  ENT: Poor dentition is seen in the lower jaw area,  mouth clear,  oropharynx clear, no postnasal drip, mild nasal turbinate edema and inflammation  Neck: No JVD, no TMG, no carotid bruits  Lungs: No use of accessory muscles, no dullness to percussion, clear without rales or rhonchi  Cardiovascular: RRR, heart sounds normal, no murmur or gallops, no peripheral edema  Abdomen: soft and NT, no HSM,  BS normal   right inguinal hernia is slightly enlarged but is not reducible but does not appear to contain any bowel  Musculoskeletal: No deformities, no cyanosis or clubbing  Neuro: alert, non focal  Skin: Warm, chronic dermatitis of the posterior scalp and neck is noted  Assessment & Plan:  I personally reviewed all images and lab data in the [facility] system as well as any outside material available during this office visit and agree with the  radiology impressions.   Allergic rhinitis Seasonal allergic rhinitis  Begin cetirizine and Flonase  Mild intermittent asthma without complication Continue as needed albuterol  Dental caries Dental resources given  COVID-19 virus infection Symptoms resolved may discontinue vitamin B 12 and vitamin C  Inguinal hernia of right side without obstruction or gangrene Follow-up per general surgery   Nikitas was seen today for headache.  Diagnoses and all orders for this visit:  Seasonal allergic rhinitis due to pollen  Mild intermittent asthma without complication  Dental caries  COVID-19 virus infection  Inguinal hernia of right side without obstruction or gangrene  Other orders -     cetirizine (ZYRTEC) 10 MG tablet; Take 1 tablet (10 mg total) by mouth daily. -     fluticasone (FLONASE) 50 MCG/ACT nasal spray; Place 2 sprays into both nostrils daily.

## 2020-11-14 NOTE — ED Provider Notes (Signed)
[facility] EMERGENCY DEPARTMENT Provider Note   CSN: [id number] Arrival date & time: 11/13/20  2319     History Chief Complaint  Patient presents with  . Chest Pain    Zachary [name] is a 60 y.o. male.  Patient presents to the emergency department for evaluation of chest pain.  Patient reports that symptoms have been present all day.  He thinks that it started after lifting a heavy object.  Patient reports that the central portion of his chest is tender to the touch and he does have worsening of pain with movement.  He did have a burning sensation in the center of his chest and stomach area earlier.  The symptoms seem to be worsened by eating.        Past Medical History:  Diagnosis Date  . Asthma   . Knee pain     Patient Active Problem List   Diagnosis Date Noted  . COVID-19 virus infection 09/15/2020  . Dermatitis 07/05/2020  . Inguinal hernia of right side without obstruction or gangrene 03/17/2020  . Periodontal disease 03/17/2020  . Dental caries 03/17/2020  . Mild intermittent asthma without complication 44/81/8563  . Gastroesophageal reflux disease without esophagitis 03/17/2020  . Chronic pain of right knee 03/17/2020    Past Surgical History:  Procedure Laterality Date  . KNEE ARTHROSCOPY     x 2       Family History  Problem Relation Age of Onset  . Hypertension Mother   . Cancer Father   . Cancer Other     Social History   Tobacco Use  . Smoking status: Former Research scientist (life sciences)  . Smokeless tobacco: Never Used  Vaping Use  . Vaping Use: Never used  Substance Use Topics  . Alcohol use: No  . Drug use: No    Home Medications Prior to Admission medications   Medication Sig Start Date End Date Taking? Authorizing Provider  albuterol (VENTOLIN HFA) 108 (90 Base) MCG/ACT inhaler Inhale 2 puffs into the lungs every 6 (six) hours as needed for wheezing or shortness of breath (cough). 09/15/20  Yes Elsie Stain, MD   Dextromethorphan-guaiFENesin [facility] DM PO) Take 1 Dose by mouth 2 (two) times daily as needed (cough/congestion).   Yes [provider]  vitamin B-12 (CYANOCOBALAMIN) 1000 MCG tablet Take 1,000 mcg by mouth daily.   Yes [provider]  vitamin C (ASCORBIC ACID) 500 MG tablet Take 500 mg by mouth daily.   Yes [provider]  famotidine (PEPCID) 20 MG tablet Take 1 tablet (20 mg total) by mouth 2 (two) times daily. Patient not taking: Reported on 01/09/2019 06/12/18 01/19/20  Orpah Greek, MD    Allergies    Patient has no known allergies.  Review of Systems   Review of Systems  Cardiovascular: Positive for chest pain.  Gastrointestinal: Positive for abdominal pain.  All other systems reviewed and are negative.   Physical Exam Updated Vital Signs BP (!) 145/95   Pulse (!) 58   Temp 97.7 F (36.5 C) (Oral)   Resp 16   Ht 5\' 6"  (1.676 m)   Wt 65.8 kg   SpO2 96%   BMI 23.40 kg/m   Physical Exam Vitals and nursing note reviewed.  Constitutional:      General: He is not in acute distress.    Appearance: Normal appearance. He is well-developed and well-nourished.  HENT:     Head: Normocephalic and atraumatic.     Right Ear: Hearing  normal.     Left Ear: Hearing normal.     Nose: Nose normal.     Mouth/Throat:     Mouth: Oropharynx is clear and moist and mucous membranes are normal.  Eyes:     Extraocular Movements: EOM normal.     Conjunctiva/sclera: Conjunctivae normal.     Pupils: Pupils are equal, round, and reactive to light.  Cardiovascular:     Rate and Rhythm: Regular rhythm.     Heart sounds: S1 normal and S2 normal. No murmur heard. No friction rub. No gallop.   Pulmonary:     Effort: Pulmonary effort is normal. No respiratory distress.     Breath sounds: Normal breath sounds.  Chest:     Chest wall: Tenderness present.    Abdominal:     General: Bowel sounds are normal.     Palpations: Abdomen is soft. There is no  hepatosplenomegaly.     Tenderness: There is no abdominal tenderness. There is no guarding or rebound. Negative signs include Murphy's sign and McBurney's sign.     Hernia: No hernia is present.  Musculoskeletal:        General: Normal range of motion.     Cervical back: Normal range of motion and neck supple.  Skin:    General: Skin is warm, dry and intact.     Findings: No rash.     Nails: There is no cyanosis.  Neurological:     Mental Status: He is alert and oriented to person, place, and time.     GCS: GCS eye subscore is 4. GCS verbal subscore is 5. GCS motor subscore is 6.     Cranial Nerves: No cranial nerve deficit.     Sensory: No sensory deficit.     Coordination: Coordination normal.     Deep Tendon Reflexes: Strength normal.  Psychiatric:        Mood and Affect: Mood and affect normal.        Speech: Speech normal.        Behavior: Behavior normal.        Thought Content: Thought content normal.     ED Results / Procedures / Treatments   Labs (all labs ordered are listed, but only abnormal results are displayed) Labs Reviewed  BASIC METABOLIC PANEL - Abnormal; Notable for the following components:      Result Value   Glucose, Bld 139 (*)    All other components within normal limits  CBC  TROPONIN I (HIGH SENSITIVITY)  TROPONIN I (HIGH SENSITIVITY)    EKG None  Radiology DG Chest 2 View  Result Date: 11/14/2020 CLINICAL DATA:  Chest pain EXAM: CHEST - 2 VIEW COMPARISON:  08/15/2020 FINDINGS: The heart size and mediastinal contours are within normal limits. Both lungs are clear. The visualized skeletal structures are unremarkable. IMPRESSION: No active cardiopulmonary disease. Electronically Signed   By: Fidela Salisbury MD   On: 11/14/2020 01:44    Procedures Procedures   Medications Ordered in ED Medications - No data to display  ED Course  I have reviewed the triage vital signs and the nursing notes.  Pertinent labs & imaging results that were  available during my care of the patient were reviewed by me and considered in my medical decision making (see chart for details).    MDM Rules/Calculators/A&P                          Patient presents to the  emergency department for evaluation of chest pain.  Patient has some features that seem to suggest musculoskeletal pain.  Symptoms began after lifting a heavy object and he does have some reproducibility with palpation as well as movement of the chest wall.  There was also, however, some burning sensation in the central lower chest that seem to be coincident with eating.  Abdominal exam is benign.  Cardiac work-up is negative.  Patient is felt to be low risk for cardiac etiology.  Treat for musculoskeletal chest pain and add treatment for reflux.  Final Clinical Impression(s) / ED Diagnoses Final diagnoses:  Chest wall pain    Rx / DC Orders ED Discharge Orders    None       Orpah Greek, MD 11/14/20 [phone]

## 2020-11-14 NOTE — ED Notes (Signed)
Pt ambulatory to 023. Vitals taken. Pt reports chest pain x couple of days with weakness in left arm. A&Ox4. Respirations regular/unlabored. Pt able to speak full sentences. Connected to cardiac monitor, bp, pulse ox. Stretcher low, wheels locked, call bell within reach.

## 2020-11-15 ENCOUNTER — Other Ambulatory Visit: Payer: Self-pay

## 2020-11-15 ENCOUNTER — Encounter: Payer: Self-pay | Admitting: Critical Care Medicine

## 2020-11-15 ENCOUNTER — Ambulatory Visit: Payer: Self-pay | Attending: Critical Care Medicine | Admitting: Critical Care Medicine

## 2020-11-15 ENCOUNTER — Other Ambulatory Visit: Payer: Self-pay | Admitting: Critical Care Medicine

## 2020-11-15 VITALS — BP 117/77 | HR 64 | Ht 66.0 in | Wt 143.6 lb

## 2020-11-15 DIAGNOSIS — J309 Allergic rhinitis, unspecified: Secondary | ICD-10-CM | POA: Insufficient documentation

## 2020-11-15 DIAGNOSIS — J4520 Mild intermittent asthma, uncomplicated: Secondary | ICD-10-CM

## 2020-11-15 DIAGNOSIS — J301 Allergic rhinitis due to pollen: Secondary | ICD-10-CM

## 2020-11-15 DIAGNOSIS — K4090 Unilateral inguinal hernia, without obstruction or gangrene, not specified as recurrent: Secondary | ICD-10-CM

## 2020-11-15 DIAGNOSIS — Z8616 Personal history of COVID-19: Secondary | ICD-10-CM

## 2020-11-15 DIAGNOSIS — U071 COVID-19: Secondary | ICD-10-CM

## 2020-11-15 DIAGNOSIS — K029 Dental caries, unspecified: Secondary | ICD-10-CM

## 2020-11-15 MED ORDER — FLUTICASONE PROPIONATE 50 MCG/ACT NA SUSP: 2.0000 | Freq: Every day | NASAL | 6 refills | Status: DC

## 2020-11-15 MED ORDER — CETIRIZINE HCL 10 MG PO TABS: 10.0000 mg | ORAL_TABLET | Freq: Every day | ORAL | 11 refills | Status: DC

## 2020-11-15 MED FILL — CETIRIZINE HCL 10 MG TABS: 10 | 30 days supply | Qty: 30 | Fill #0

## 2020-11-15 NOTE — Assessment & Plan Note (Signed)
Seasonal allergic rhinitis  Begin cetirizine and Flonase

## 2020-11-15 NOTE — Assessment & Plan Note (Signed)
Continue as needed albuterol. ? ?

## 2020-11-15 NOTE — Patient Instructions (Signed)
Begin cetirizine daily 10 mg orally and Flonase 2 sprays each nostril daily for your allergies  You can discontinue the B12 and vitamin C  Use albuterol as needed  Use the Flexeril as needed for muscle spasm on your chest wall  Please remember to obtain your COVID booster vaccine below is where you can find a COVID booster  Keep your upcoming appointment with Dr. Hulen Skains for your hernia  Return to see Dr. Joya [name] 4 months  COVID-19 Vaccine Information can be found at: ShippingScam.co.uk For questions related to vaccine distribution or appointments, please email [email or web address] or call 4503333081.

## 2020-11-15 NOTE — Assessment & Plan Note (Signed)
Follow-up per general surgery

## 2020-11-15 NOTE — Assessment & Plan Note (Signed)
Dental resources given

## 2020-11-15 NOTE — Progress Notes (Signed)
Having sinus headaches.

## 2020-11-15 NOTE — Assessment & Plan Note (Addendum)
Symptoms resolved may discontinue vitamin B 12 and vitamin C

## 2020-11-16 ENCOUNTER — Ambulatory Visit: Payer: Self-pay | Attending: General Surgery | Admitting: General Surgery

## 2020-11-16 ENCOUNTER — Encounter: Payer: Self-pay | Admitting: General Surgery

## 2020-11-16 VITALS — BP 120/82 | HR 74 | Temp 98.3°F | Resp 18 | Ht 66.0 in | Wt 142.2 lb

## 2020-11-16 DIAGNOSIS — K4090 Unilateral inguinal hernia, without obstruction or gangrene, not specified as recurrent: Secondary | ICD-10-CM

## 2020-11-16 NOTE — Patient Instructions (Signed)
No further work up at this time.  If he needs surgical exploration, then it can be fixed at that time.

## 2020-11-16 NOTE — Progress Notes (Signed)
Acute Office Visit  Subjective:    Patient ID: Zachary [name], male    DOB: [birth year], 60 y.o.   MRN: [id number]  Chief Complaint  Patient presents with  . Hernia    Has shad right groin "hernia" for over five years.  I put this in quotes because it may not be a hernia, possible lipoma  Groin Pain The patient's primary symptoms include genital lesions. This is a chronic problem. The current episode started more than 1 year ago. The problem has been unchanged. The patient is experiencing no pain. Nothing aggravates the symptoms. He has tried nothing for the symptoms. No, his partner does not have an STD.   Patient is in today for evaluation of a right inguinal hernia.  Patient states that he has had this "hernia" for over five years.  Once it appeared it has never gone away.  Asymptomatic.  Does not bother him when he lifts or works in any way.    No obstructive symptoms.  Past Medical History:  Diagnosis Date  . Asthma   . COVID-19 virus infection 09/15/2020  . Knee pain     Past Surgical History:  Procedure Laterality Date  . KNEE ARTHROSCOPY     x 2    Family History  Problem Relation Age of Onset  . Hypertension Mother   . Cancer Father   . Cancer Other     Social History   Socioeconomic History  . Marital status: Single    Spouse name: Not on file  . Number of children: Not on file  . Years of education: Not on file  . Highest education level: Not on file  Occupational History  . Not on file  Tobacco Use  . Smoking status: Former Research scientist (life sciences)  . Smokeless tobacco: Never Used  Vaping Use  . Vaping Use: Never used  Substance and Sexual Activity  . Alcohol use: No  . Drug use: No  . Sexual activity: Yes  Other Topics Concern  . Not on file  Social History Narrative  . Not on file   Social Determinants of Health   Financial Resource Strain: Not on file  Food Insecurity: Not on file  Transportation Needs: Not on file  Physical Activity: Not on file   Stress: Not on file  Social Connections: Not on file  Intimate Partner Violence: Not on file    Outpatient Medications Prior to Visit  Medication Sig Dispense Refill  . albuterol (VENTOLIN HFA) 108 (90 Base) MCG/ACT inhaler Inhale 2 puffs into the lungs every 6 (six) hours as needed for wheezing or shortness of breath (cough). 18 g 0  . cetirizine (ZYRTEC) 10 MG tablet Take 1 tablet (10 mg total) by mouth daily. 30 tablet 11  . cyclobenzaprine (FLEXERIL) 10 MG tablet Take 1 tablet (10 mg total) by mouth 3 (three) times daily as needed for muscle spasms. 20 tablet 0  . Dextromethorphan-guaiFENesin (MUCINEX DM PO) Take 1 Dose by mouth 2 (two) times daily as needed (cough/congestion).    . famotidine (PEPCID) 20 MG tablet Take 1 tablet (20 mg total) by mouth 2 (two) times daily. (Patient taking differently: Take 20 mg by mouth daily as needed.) 60 tablet 0  . fluticasone (FLONASE) 50 MCG/ACT nasal spray Place 2 sprays into both nostrils daily. 16 g 6   No facility-administered medications prior to visit.    No Known Allergies  Review of Systems  All other systems reviewed and are negative.  Objective:    Physical Exam Constitutional:      General: He is not in acute distress.    Appearance: Normal appearance. He is normal weight.  Abdominal:     Hernia: There is no hernia in the right inguinal area (I could not palpate inguinal canal pulse with coughing).  Genitourinary:    Testes:        Right: Mass (in the area of a direct hernia, but was not reducible at all.) and swelling present. Tenderness not present.    Neurological:     Mental Status: He is alert.     BP 120/82 (BP Location: Right Arm, Patient Position: Sitting, Cuff Size: Normal)   Pulse 74   Temp 98.3 F (36.8 C) (Oral)   Resp 18   Ht 5\' 6"  (1.676 m)   Wt 142 lb 3.2 oz (64.5 kg)   SpO2 97%   BMI 22.95 kg/m  Wt Readings from Last 3 Encounters:  11/16/20 142 lb 3.2 oz (64.5 kg)  11/15/20 143 lb 9.6 oz  (65.1 kg)  11/14/20 145 lb (65.8 kg)    Health Maintenance Due  Topic Date Due  . COVID-19 Vaccine (3 - Booster for Moderna series) 09/29/2020    There are no preventive care reminders to display for this patient.   No results found for: TSH Lab Results  Component Value Date   WBC 5.0 11/14/2020   HGB 13.9 11/14/2020   HCT 41.1 11/14/2020   MCV 90.5 11/14/2020   PLT 250 11/14/2020   Lab Results  Component Value Date   NA 137 11/14/2020   K 3.5 11/14/2020   CO2 27 11/14/2020   GLUCOSE 139 (H) 11/14/2020   BUN 12 11/14/2020   CREATININE 0.87 11/14/2020   BILITOT 0.9 05/18/2020   ALKPHOS 67 05/18/2020   AST 20 05/18/2020   ALT 27 05/18/2020   PROT 8.0 05/18/2020   ALBUMIN 4.2 05/18/2020   CALCIUM 9.6 11/14/2020   ANIONGAP 8 11/14/2020   Lab Results  Component Value Date   CHOL 201 (H) 07/15/2007   Lab Results  Component Value Date   HDL 40 07/15/2007   Lab Results  Component Value Date   LDLCALC 119 (H) 07/15/2007   Lab Results  Component Value Date   TRIG 209 (H) 07/15/2007   Lab Results  Component Value Date   CHOLHDL 5.0 Ratio 07/15/2007   No results found for: HGBA1C     Assessment & Plan:  1.  Right inguina/groin mass, possible incarcerated direct hernia (no bowel sounds, no bowel, minimal symptoms), possible right groin lipoma  The only way to for sure decide whatis present is with an ultrasound, CT or surgicla exploration.  With it being completely asyptomatic I would wait until he has some funding to do further work up.  If it is surgically explored and it turns out to be a lipoma or hernia, both can be fixed at that time. Problem List Items Addressed This Visit    Inguinal hernia of right side without obstruction or gangrene - Primary       No orders of the defined types were placed in this encounter.    Judeth Horn, MD

## 2021-01-07 ENCOUNTER — Emergency Department (HOSPITAL_COMMUNITY): Payer: Self-pay

## 2021-01-07 ENCOUNTER — Emergency Department (HOSPITAL_COMMUNITY)
Admission: EM | Admit: 2021-01-07 | Discharge: 2021-01-08 | Disposition: A | Discharge status: Home / Self Care | Payer: Self-pay | Attending: Emergency Medicine | Admitting: Emergency Medicine

## 2021-01-07 ENCOUNTER — Other Ambulatory Visit: Payer: Self-pay

## 2021-01-07 DIAGNOSIS — M7041 Prepatellar bursitis, right knee: Secondary | ICD-10-CM | POA: Insufficient documentation

## 2021-01-07 DIAGNOSIS — Z7951 Long term (current) use of inhaled steroids: Secondary | ICD-10-CM | POA: Insufficient documentation

## 2021-01-07 DIAGNOSIS — X58XXXA Exposure to other specified factors, initial encounter: Secondary | ICD-10-CM | POA: Insufficient documentation

## 2021-01-07 DIAGNOSIS — J4520 Mild intermittent asthma, uncomplicated: Secondary | ICD-10-CM | POA: Insufficient documentation

## 2021-01-07 DIAGNOSIS — Z87891 Personal history of nicotine dependence: Secondary | ICD-10-CM | POA: Insufficient documentation

## 2021-01-07 DIAGNOSIS — Y9389 Activity, other specified: Secondary | ICD-10-CM | POA: Insufficient documentation

## 2021-01-07 DIAGNOSIS — Z8616 Personal history of COVID-19: Secondary | ICD-10-CM | POA: Insufficient documentation

## 2021-01-07 DIAGNOSIS — Y990 Civilian activity done for income or pay: Secondary | ICD-10-CM | POA: Insufficient documentation

## 2021-01-08 MED ORDER — IBUPROFEN 400 MG PO TABS: 600.0000 mg | ORAL_TABLET | Freq: Once | ORAL | Status: DC

## 2021-01-08 MED ORDER — IBUPROFEN 600 MG PO TABS: 600.0000 mg | ORAL_TABLET | Freq: Four times a day (QID) | ORAL | 0 refills | Status: DC | PRN

## 2021-01-08 NOTE — ED Provider Notes (Signed)
[facility] EMERGENCY DEPARTMENT Provider Note   CSN: [id number] Arrival date & time: 01/07/21  2237     History Chief Complaint  Patient presents with  . Joint Swelling    Pt c/o right knee swelling x 2 months which is now in his left calf. Hx of knee replacement, no injury.    Zachary [name] is a 60 y.o. male.  The patient presents for evaluation of painful swelling to the front of his right knee that started 2 months ago and has gotten progressively worse. No fever, wound, drainage. 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 No language interpreter was used.       Past Medical History:  Diagnosis Date  . Asthma   . COVID-19 virus infection 09/15/2020  . Knee pain     Patient Active Problem List   Diagnosis Date Noted  . Allergic rhinitis 11/15/2020  . Dermatitis 07/05/2020  . Inguinal hernia of right side without obstruction or gangrene 03/17/2020  . Periodontal disease 03/17/2020  . Dental caries 03/17/2020  . Mild intermittent asthma without complication 23/76/2831  . Gastroesophageal reflux disease without esophagitis 03/17/2020  . Chronic pain of right knee 03/17/2020    Past Surgical History:  Procedure Laterality Date  . KNEE ARTHROSCOPY     x 2       Family History  Problem Relation Age of Onset  . Hypertension Mother   . Cancer Father   . Cancer Other     Social History   Tobacco Use  . Smoking status: Former Research scientist (life sciences)  . Smokeless tobacco: Never Used  Vaping Use  . Vaping Use: Never used  Substance Use Topics  . Alcohol use: No  . Drug use: No    Home Medications Prior to Admission medications   Medication Sig Start Date End Date Taking? Authorizing Provider  albuterol (VENTOLIN HFA) 108 (90 Base) MCG/ACT inhaler Inhale 2 puffs into the lungs every 6 (six) hours as needed for wheezing or shortness of breath  (cough). 09/15/20   Elsie Stain, MD  cetirizine (ZYRTEC) 10 MG tablet TAKE 1 TABLET (10 MG TOTAL) BY MOUTH DAILY. 11/15/20 11/15/21  Elsie Stain, MD  cyclobenzaprine (FLEXERIL) 10 MG tablet Take 1 tablet (10 mg total) by mouth 3 (three) times daily as needed for muscle spasms. 11/14/20   Orpah Greek, MD  Dextromethorphan-guaiFENesin (MUCINEX DM PO) Take 1 Dose by mouth 2 (two) times daily as needed (cough/congestion).    [provider]  famotidine (PEPCID) 20 MG tablet Take 1 tablet (20 mg total) by mouth 2 (two) times daily. Patient taking differently: Take 20 mg by mouth daily as needed. 11/14/20   Orpah Greek, MD  fluticasone (FLONASE) 50 MCG/ACT nasal spray PLACE 2 SPRAYS INTO BOTH NOSTRILS DAILY. 11/15/20 11/15/21  Elsie Stain, MD    Allergies    Patient has no known allergies.  Review of Systems   Review of Systems  Musculoskeletal:       See HPI  Skin: Negative.  Negative for color change.  Neurological: Negative.  Negative for numbness.    Physical Exam Updated Vital Signs BP 130/80 (BP Location: Left Arm)   Pulse 81   Temp 98 F (36.7 C) (Oral)   Resp 16   Ht 5\' 6"  (1.676 m)   Wt 65.8 kg   SpO2 99%  BMI 23.40 kg/m   Physical Exam Vitals and nursing note reviewed.  Constitutional:      Appearance: He is well-developed.  Pulmonary:     Effort: Pulmonary effort is normal.  Musculoskeletal:        General: Normal range of motion.     Cervical back: Normal range of motion.     Comments: Right knee swollen anteriorly. Fluctuant without redness, warmth, blistering. Moderately tender. Joint stable. No calf or thigh tenderness.  Skin:    General: Skin is warm and dry.  Neurological:     Mental Status: He is alert and oriented to person, place, and time.     ED Results / Procedures / Treatments   Labs (all labs ordered are listed, but only abnormal results are displayed) Labs Reviewed - No data to  display  EKG None  Radiology DG Knee 2 Views Right  Result Date: 01/07/2021 CLINICAL DATA:  Right knee pain and swelling. EXAM: RIGHT KNEE - 1-2 VIEW COMPARISON:  March 09, 2014 FINDINGS: No evidence of fracture, dislocation, or joint effusion. Moderate to marked severity medial tibiofemoral compartment space narrowing is seen. Mild lateral tibiofemoral compartment space narrowing is also noted. Moderate to marked severity focal soft tissue swelling is seen inferior to the right patella and along the anterior aspect of the proximal right tibia. IMPRESSION: Anterior soft tissue swelling, as described above, without an acute osseous abnormality. Electronically Signed   By: Virgina Norfolk M.D.   On: 01/07/2021 23:36    Procedures Procedures   Medications Ordered in ED Medications - No data to display  ED Course  I have reviewed the triage vital signs and the nursing notes.  Pertinent labs & imaging results that were available during my care of the patient were reviewed by me and considered in my medical decision making (see chart for details).    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   Discussed care and precautions when he returns to work. Will Rx ibuprofen. Recommend PCP follow up prn.   Final Clinical Impression(s) / ED Diagnoses Final diagnoses:  None   1. Prepatellar bursitis  Rx / DC Orders ED Discharge Orders    None       Charlann Lange, PA-C 01/08/21 0251    Veryl Speak, MD 01/08/21 [id number]

## 2021-01-08 NOTE — Discharge Instructions (Signed)
Take ibuprofen as directed every 6 hours for pain and inflammation. Rest the knee for the next several days. Apply ice to reduce swelling.   When you return to work, it is important to use your knee pads.   Follow up with your doctor as needed for persistent symptoms.

## 2021-03-17 ENCOUNTER — Other Ambulatory Visit: Payer: Self-pay

## 2021-03-17 ENCOUNTER — Ambulatory Visit: Payer: Self-pay | Attending: Critical Care Medicine | Admitting: Critical Care Medicine

## 2021-03-17 ENCOUNTER — Encounter: Payer: Self-pay | Admitting: Critical Care Medicine

## 2021-03-17 VITALS — BP 114/80 | HR 65 | Temp 98.3°F | Resp 16 | Ht 65.98 in | Wt 141.8 lb

## 2021-03-17 DIAGNOSIS — K4090 Unilateral inguinal hernia, without obstruction or gangrene, not specified as recurrent: Secondary | ICD-10-CM

## 2021-03-17 DIAGNOSIS — J0101 Acute recurrent maxillary sinusitis: Secondary | ICD-10-CM

## 2021-03-17 DIAGNOSIS — K056 Periodontal disease, unspecified: Secondary | ICD-10-CM

## 2021-03-17 MED ORDER — METHYLPREDNISOLONE SODIUM SUCC 125 MG IJ SOLR
125.0000 mg | Freq: Once | INTRAMUSCULAR | Status: AC
  Administered 2021-03-17: 125 mg via INTRAMUSCULAR

## 2021-03-17 MED ORDER — IBUPROFEN 600 MG PO TABS
600.0000 mg | ORAL_TABLET | Freq: Four times a day (QID) | ORAL | 0 refills | Status: DC | PRN
Start: 2021-03-17 — End: 2021-08-23
  Filled 2021-03-17: qty 30, 8d supply, fill #0

## 2021-03-17 MED ORDER — DOXYCYCLINE HYCLATE 100 MG PO TABS
100.0000 mg | ORAL_TABLET | Freq: Two times a day (BID) | ORAL | 0 refills | Status: AC
Start: 2021-03-17 — End: 2021-03-24
  Filled 2021-03-17: qty 14, 7d supply, fill #0

## 2021-03-17 MED ORDER — FLUTICASONE PROPIONATE 50 MCG/ACT NA SUSP
2.0000 | Freq: Every day | NASAL | 6 refills | Status: DC
  Filled 2021-03-17: qty 16, 30d supply, fill #0

## 2021-03-17 NOTE — Progress Notes (Signed)
Pt presents for sinus pressure, symptoms include fatigue and pounding headache for 2 weeks

## 2021-03-17 NOTE — Assessment & Plan Note (Signed)
Per general surgery note in March we are watching this hernia for now it may actually end up being a lipoma but see how it develops

## 2021-03-17 NOTE — Assessment & Plan Note (Signed)
Severe periodontal disease and dental caries in the lower jaw I asked the patient to pick up a financial assistance forms to get on the orange card and get into a dental free clinic

## 2021-03-17 NOTE — Progress Notes (Signed)
Subjective:    Patient ID: Zachary [name], male    DOB: [birth year], 60 y.o.   MRN: [id number]  03/17/20 This is a pleasant 60 year old male who had been seen in the emergency room for chest discomfort in late May.  Work-up for cardiac disorder was negative.  Chest x-ray negative.  Patient was given prescription for Protonix and he states this is resolved his chest pain.  He also has history of mild intermittent asthma and had an albuterol inhaler in the past but no longer has this.  Patient also complains of a right inguinal hernia but does not have current pain from this.  No prior history of known hypertension or diabetes.  No other significant complaints at this time.  Patient does have prior history of right knee pain with prior arthroscopy knee pain is tolerable at this moment.  Patient does have dental caries and has remaining teeth in the lower front jaw and the left posterior jaw that need to be removed other teeth been extracted  07/05/2020 This patient is seen in return follow-up and was seen recently for tinea cruris which has resolved using antifungal therapy.  He states he does have occasional chest tightness but shortness of breath is at baseline with his asthma.  He complains that his right inguinal hernia is slightly larger.  He is yet to see a dentist for his severe dental caries.  He states proton pump inhibitor does help his reflux symptoms.  He does wish to avoid a flu vaccine at this time.  However he is up-to-date on his Covid vaccine.  His frequent urination situation has resolved itself and he does not have diabetes on blood screen  At the last visit colon cancer screen was negative and HCV was negative  09/15/2020 This patient comes in for a face-to-face visit after having been to the emergency room over the [place] holiday with symptoms of fever cough shortness of breath muscle aches and pains Patient has history of asthma dental caries periodontal disease inguinal  hernia on the right without obstruction  Patient was found to be Covid positive at the urgent care and he is here today for return follow-up  He states his symptoms are rapidly improving and on arrival he is afebrile and oxygen saturation stable The patient was seen in full PPE protection per office Covid protocol  11/15/20 Patient was seen in return follow-up complaining of nasal congestion drainage and sinus pressure  He otherwise is completely resolved all of his COVID-19 viral infection symptoms Patient did go to the emergency room yesterday because of chest pain after heavy lifting.  EKG and troponins were negative and he was thought to have musculoskeletal pain anterior chest wall.  He states the pain is better today.  No other complaints at this time.  He does have severe dental caries and wishes a dental referral  Patient with inguinal hernia has upcoming appointment with surgery  03/17/2021 Patient seen in return follow-up complaining of increased sinus pressure sinus congestion increased difficulty.  Patient has thick yellow mucus coming out he denies fever see sinus assessment below  Sinus Problem This is a recurrent problem. The current episode started more than 1 year ago. The problem has been gradually improving since onset. There has been no fever. His pain is at a severity of 8/10. The pain is moderate. Associated symptoms include congestion, coughing, headaches, a hoarse voice, sinus pressure and sneezing. Pertinent negatives include no chills, diaphoresis, ear pain, shortness of breath,  sore throat or swollen glands. Past treatments include spray decongestants. The treatment provided mild relief.  Past Medical History:  Diagnosis Date   Asthma    COVID-19 virus infection 09/15/2020   Knee pain      Family History  Problem Relation Age of Onset   Hypertension Mother    Cancer Father    Cancer Other      Social History   Socioeconomic History   Marital status: Single     Spouse name: Not on file   Number of children: Not on file   Years of education: Not on file   Highest education level: Not on file  Occupational History   Not on file  Tobacco Use   Smoking status: Former    Pack years: 0.00   Smokeless tobacco: Never  Vaping Use   Vaping Use: Never used  Substance and Sexual Activity   Alcohol use: No   Drug use: No   Sexual activity: Yes  Other Topics Concern   Not on file  Social History Narrative   Not on file   Social Determinants of Health   Financial Resource Strain: Not on file  Food Insecurity: Not on file  Transportation Needs: Not on file  Physical Activity: Not on file  Stress: Not on file  Social Connections: Not on file  Intimate Partner Violence: Not on file     No Known Allergies   Outpatient Medications Prior to Visit  Medication Sig Dispense Refill   albuterol (VENTOLIN HFA) 108 (90 Base) MCG/ACT inhaler Inhale 2 puffs into the lungs every 6 (six) hours as needed for wheezing or shortness of breath (cough). 18 g 0   cetirizine (ZYRTEC) 10 MG tablet TAKE 1 TABLET (10 MG TOTAL) BY MOUTH DAILY. 30 tablet 11   famotidine (PEPCID) 20 MG tablet Take 1 tablet (20 mg total) by mouth 2 (two) times daily. (Patient taking differently: Take 20 mg by mouth daily as needed.) 60 tablet 0   cyclobenzaprine (FLEXERIL) 10 MG tablet Take 1 tablet (10 mg total) by mouth 3 (three) times daily as needed for muscle spasms. (Patient not taking: Reported on 03/17/2021) 20 tablet 0   Dextromethorphan-guaiFENesin (MUCINEX DM PO) Take 1 Dose by mouth 2 (two) times daily as needed (cough/congestion). (Patient not taking: Reported on 03/17/2021)     fluticasone (FLONASE) 50 MCG/ACT nasal spray PLACE 2 SPRAYS INTO BOTH NOSTRILS DAILY. (Patient not taking: Reported on 03/17/2021) 16 g 6   ibuprofen (ADVIL) 600 MG tablet Take 1 tablet (600 mg total) by mouth every 6 (six) hours as needed. (Patient not taking: Reported on 03/17/2021) 30 tablet 0   No  facility-administered medications prior to visit.       Review of Systems  Constitutional:  Negative for chills and diaphoresis.  HENT:  Positive for congestion, hoarse voice, rhinorrhea, sinus pressure and sneezing. Negative for ear pain and sore throat.   Respiratory:  Positive for cough. Negative for chest tightness and shortness of breath.   Neurological:  Positive for headaches.      Objective:   Physical Exam Vitals:   03/17/21 1044  BP: 114/80  Pulse: 65  Resp: 16  Temp: 98.3 F (36.8 C)  SpO2: 98%  Weight: 141 lb 12.8 oz (64.3 kg)  Height: 5' 5.98" (1.676 m)    Gen: Pleasant, well-nourished, in no distress,  normal affect  ENT: Poor dentition is seen in the lower jaw area,  mouth clear,  oropharynx clear, 3+ postnasal drip, severe  nasal turbinate edema and inflammation with purulence bilaterally  Neck: No JVD, no TMG, no carotid bruits  Lungs: No use of accessory muscles, no dullness to percussion, clear without rales or rhonchi  Cardiovascular: RRR, heart sounds normal, no murmur or gallops, no peripheral edema  Abdomen: soft and NT, no HSM,  BS normal    Musculoskeletal: No deformities, no cyanosis or clubbing  Neuro: alert, non focal  Skin: Warm no rash    Assessment & Plan:  I personally reviewed all images and lab data in the [facility] system as well as any outside material available during this office visit and agree with the  radiology impressions.   Acute recurrent maxillary sinusitis Acute recurrent maxillary sinusitis plan we will patient receive Solu-Medrol 125 mg IM and a 7-day course of doxycycline 100 mg twice daily we also given NeilMed sinus rinse twice daily continue Flonase daily hold cetirizine for 10 days and resume  Periodontal disease Severe periodontal disease and dental caries in the lower jaw I asked the patient to pick up a financial assistance forms to get on the orange card and get into a dental free clinic  Inguinal hernia of right  side without obstruction or gangrene Per general surgery note in March we are watching this hernia for now it may actually end up being a lipoma but see how it develops   [place] was seen today for sinus problem.  Diagnoses and all orders for this visit:  Acute recurrent maxillary sinusitis -     methylPREDNISolone sodium succinate (SOLU-MEDROL) 125 mg/2 mL injection 125 mg  Periodontal disease  Inguinal hernia of right side without obstruction or gangrene  Other orders -     doxycycline (VIBRA-TABS) 100 MG tablet; Take 1 tablet (100 mg total) by mouth 2 (two) times daily for 7 days. -     fluticasone (FLONASE) 50 MCG/ACT nasal spray; PLACE 2 SPRAYS INTO BOTH NOSTRILS DAILY. -     ibuprofen (ADVIL) 600 MG tablet; Take 1 tablet (600 mg total) by mouth every 6 (six) hours as needed.

## 2021-03-17 NOTE — Patient Instructions (Signed)
A Solu-Medrol steroid injection was given  Begin doxycycline 1 twice daily for 7 days  Hold the cetirizine for 10 days and then resume  Resume Flonase 2 sprays each nostril daily  Use the NeilMed sinus rinse system we gave you a sample put distilled water or bottled water to the dotted line room temperature with a packet of medication in the bottle and rinse both sinuses out leaning forward over the sink when you do this  Keep yourself well-hydrated during this heat  Return to see Dr. Joya [name] 2 months

## 2021-03-17 NOTE — Assessment & Plan Note (Signed)
Acute recurrent maxillary sinusitis plan we will patient receive Solu-Medrol 125 mg IM and a 7-day course of doxycycline 100 mg twice daily we also given NeilMed sinus rinse twice daily continue Flonase daily hold cetirizine for 10 days and resume

## 2021-08-23 ENCOUNTER — Encounter: Payer: Self-pay | Admitting: Critical Care Medicine

## 2021-08-23 ENCOUNTER — Ambulatory Visit: Payer: Self-pay | Attending: Critical Care Medicine | Admitting: Critical Care Medicine

## 2021-08-23 ENCOUNTER — Other Ambulatory Visit: Payer: Self-pay

## 2021-08-23 VITALS — BP 136/90 | HR 68 | Resp 16 | Wt 146.6 lb

## 2021-08-23 DIAGNOSIS — K029 Dental caries, unspecified: Secondary | ICD-10-CM

## 2021-08-23 DIAGNOSIS — J0101 Acute recurrent maxillary sinusitis: Secondary | ICD-10-CM

## 2021-08-23 DIAGNOSIS — Z1211 Encounter for screening for malignant neoplasm of colon: Secondary | ICD-10-CM

## 2021-08-23 DIAGNOSIS — K4090 Unilateral inguinal hernia, without obstruction or gangrene, not specified as recurrent: Secondary | ICD-10-CM

## 2021-08-23 DIAGNOSIS — J4520 Mild intermittent asthma, uncomplicated: Secondary | ICD-10-CM

## 2021-08-23 MED ORDER — AZITHROMYCIN 250 MG PO TABS
ORAL_TABLET | ORAL | 0 refills | Status: DC
  Filled 2021-08-23: qty 6, 5d supply, fill #0

## 2021-08-23 NOTE — Assessment & Plan Note (Signed)
Plan 5-day course of azithromycin

## 2021-08-23 NOTE — Assessment & Plan Note (Signed)
Continue as needed albuterol

## 2021-08-23 NOTE — Assessment & Plan Note (Signed)
Severe dental caries periodontal disease patient needs to obtain the orange card so can send him to a dental clinic

## 2021-08-23 NOTE — Progress Notes (Signed)
Established Patient Office Visit  Subjective:  Patient ID: Zachary [name], male    DOB: 02/24/1961  Age: 60 y.o. MRN: [id number]  CC:  Chief Complaint  Patient presents with   Hernia    HPI LECIL TAPP presents for primary care follow-up with history of hypertension and chronic recurrent sinus infections.  On arrival blood pressure is 136/90.  Patient also continues to complain of right lower hernia in the inguinal space.  Patient still coughing up thick green mucus at this time.  He continues with Zyrtec.   Patient declines all vaccinations at this time   Past Medical History:  Diagnosis Date   Asthma    COVID-19 virus infection 09/15/2020   Knee pain     Past Surgical History:  Procedure Laterality Date   KNEE ARTHROSCOPY     x 2    Family History  Problem Relation Age of Onset   Hypertension Mother    Cancer Father    Cancer Other     Social History   Socioeconomic History   Marital status: Single    Spouse name: Not on file   Number of children: Not on file   Years of education: Not on file   Highest education level: Not on file  Occupational History   Not on file  Tobacco Use   Smoking status: Former   Smokeless tobacco: Never  Vaping Use   Vaping Use: Never used  Substance and Sexual Activity   Alcohol use: No   Drug use: No   Sexual activity: Yes  Other Topics Concern   Not on file  Social History Narrative   Not on file   Social Determinants of Health   Financial Resource Strain: Not on file  Food Insecurity: Not on file  Transportation Needs: Not on file  Physical Activity: Not on file  Stress: Not on file  Social Connections: Not on file  Intimate Partner Violence: Not on file    Outpatient Medications Prior to Visit  Medication Sig Dispense Refill   albuterol (VENTOLIN HFA) 108 (90 Base) MCG/ACT inhaler Inhale 2 puffs into the lungs every 6 (six) hours as needed for wheezing or shortness of breath (cough). 18 g 0    cetirizine (ZYRTEC) 10 MG tablet TAKE 1 TABLET (10 MG TOTAL) BY MOUTH DAILY. 30 tablet 11   fluticasone (FLONASE) 50 MCG/ACT nasal spray PLACE 2 SPRAYS INTO BOTH NOSTRILS DAILY. 16 g 6   ibuprofen (ADVIL) 600 MG tablet Take 1 tablet (600 mg total) by mouth every 6 (six) hours as needed. 30 tablet 0   No facility-administered medications prior to visit.    No Known Allergies  ROS Review of Systems  Constitutional:  Negative for chills, diaphoresis and fever.  HENT:  Positive for congestion, dental problem, postnasal drip, rhinorrhea, sinus pressure and sinus pain. Negative for ear discharge, ear pain, hearing loss, nosebleeds, sore throat and tinnitus.   Eyes:  Negative for photophobia and redness.  Respiratory:  Positive for cough, shortness of breath and wheezing. Negative for stridor.   Cardiovascular:  Negative for chest pain, palpitations and leg swelling.  Gastrointestinal:  Negative for abdominal pain, blood in stool, constipation, diarrhea, nausea and vomiting.  Endocrine: Negative for polydipsia.  Genitourinary:  Negative for dysuria, flank pain, frequency, hematuria and urgency.  Musculoskeletal:  Negative for back pain, myalgias and neck pain.  Skin:  Negative for rash.  Allergic/Immunologic: Negative for environmental allergies.  Neurological:  Negative for dizziness, tremors, seizures, weakness  and headaches.  Hematological:  Does not bruise/bleed easily.  Psychiatric/Behavioral:  Negative for suicidal ideas. The patient is not nervous/anxious.      Objective:    Physical Exam Vitals reviewed.  Constitutional:      Appearance: Normal appearance. He is well-developed. He is not diaphoretic.  HENT:     Head: Normocephalic and atraumatic.     Nose: Congestion and rhinorrhea present. No nasal deformity, septal deviation or mucosal edema.     Right Sinus: No maxillary sinus tenderness or frontal sinus tenderness.     Left Sinus: No maxillary sinus tenderness or frontal  sinus tenderness.     Comments: Bilateral nasal purulence turbinate edema    Mouth/Throat:     Mouth: Mucous membranes are moist.     Pharynx: Oropharynx is clear. No oropharyngeal exudate.  Eyes:     General: No scleral icterus.    Conjunctiva/sclera: Conjunctivae normal.     Pupils: Pupils are equal, round, and reactive to light.  Neck:     Thyroid: No thyromegaly.     Vascular: No carotid bruit or JVD.     Trachea: Trachea normal. No tracheal tenderness or tracheal deviation.  Cardiovascular:     Rate and Rhythm: Normal rate and regular rhythm.     Chest Wall: PMI is not displaced.     Pulses: Normal pulses. No decreased pulses.     Heart sounds: Normal heart sounds, S1 normal and S2 normal. Heart sounds not distant. No murmur heard. No systolic murmur is present.  No diastolic murmur is present.    No friction rub. No gallop. No S3 or S4 sounds.  Pulmonary:     Effort: Pulmonary effort is normal. No tachypnea, accessory muscle usage or respiratory distress.     Breath sounds: Normal breath sounds. No stridor. No decreased breath sounds, wheezing, rhonchi or rales.  Chest:     Chest wall: No tenderness.  Abdominal:     General: Bowel sounds are normal. There is no distension.     Palpations: Abdomen is soft. Abdomen is not rigid.     Tenderness: There is no abdominal tenderness. There is no guarding or rebound.     Hernia: A hernia is present.     Comments: Right inguinal hernia with fat-containing areas.  No evidence of incarceration or acute tenderness  Musculoskeletal:        General: Normal range of motion.     Cervical back: Normal range of motion and neck supple. No edema, erythema or rigidity. No muscular tenderness. Normal range of motion.  Lymphadenopathy:     Head:     Right side of head: No submental or submandibular adenopathy.     Left side of head: No submental or submandibular adenopathy.     Cervical: No cervical adenopathy.  Skin:    General: Skin is warm  and dry.     Coloration: Skin is not pale.     Findings: No rash.     Nails: There is no clubbing.  Neurological:     Mental Status: He is alert and oriented to person, place, and time.     Sensory: No sensory deficit.  Psychiatric:        Speech: Speech normal.        Behavior: Behavior normal.    BP 136/90   Pulse 68   Resp 16   Wt 146 lb 9.6 oz (66.5 kg)   SpO2 97%   BMI 23.67 kg/m  Wt Readings from  Last 3 Encounters:  08/23/21 146 lb 9.6 oz (66.5 kg)  03/17/21 141 lb 12.8 oz (64.3 kg)  01/07/21 145 lb (65.8 kg)     Health Maintenance Due  Topic Date Due   COLON CANCER SCREENING ANNUAL FOBT  03/18/2021    There are no preventive care reminders to display for this patient.  No results found for: TSH Lab Results  Component Value Date   WBC 5.0 11/14/2020   HGB 13.9 11/14/2020   HCT 41.1 11/14/2020   MCV 90.5 11/14/2020   PLT 250 11/14/2020   Lab Results  Component Value Date   NA 137 11/14/2020   K 3.5 11/14/2020   CO2 27 11/14/2020   GLUCOSE 139 (H) 11/14/2020   BUN 12 11/14/2020   CREATININE 0.87 11/14/2020   BILITOT 0.9 05/18/2020   ALKPHOS 67 05/18/2020   AST 20 05/18/2020   ALT 27 05/18/2020   PROT 8.0 05/18/2020   ALBUMIN 4.2 05/18/2020   CALCIUM 9.6 11/14/2020   ANIONGAP 8 11/14/2020   Lab Results  Component Value Date   CHOL 201 (H) 07/15/2007   Lab Results  Component Value Date   HDL 40 07/15/2007   Lab Results  Component Value Date   LDLCALC 119 (H) 07/15/2007   Lab Results  Component Value Date   TRIG 209 (H) 07/15/2007   Lab Results  Component Value Date   CHOLHDL 5.0 Ratio 07/15/2007   No results found for: HGBA1C    Assessment & Plan:   Problem List Items Addressed This Visit       Respiratory   Mild intermittent asthma without complication    Continue as needed albuterol      Acute recurrent maxillary sinusitis - Primary    Plan 5-day course of azithromycin      Relevant Medications   azithromycin  (ZITHROMAX) 250 MG tablet     Digestive   Dental caries    Severe dental caries periodontal disease patient needs to obtain the orange card so can send him to a dental clinic      Relevant Medications   azithromycin (ZITHROMAX) 250 MG tablet     Other   Inguinal hernia of right side without obstruction or gangrene    Continued observation of the inguinal hernia is advised      Other Visit Diagnoses     Colon cancer screening       Relevant Orders   Fecal occult blood, imunochemical       Meds ordered this encounter  Medications   azithromycin (ZITHROMAX) 250 MG tablet    Sig: Take two tablets  by mouth day 1 then 1 tablet daily days 2-5    Dispense:  6 tablet    Refill:  0  Will obtain colon cancer screening with fecal occult kit  Follow-up: Return in about 5 months (around 01/21/2022).    Asencion Noble, MD

## 2021-08-23 NOTE — Patient Instructions (Signed)
Take azithromycin as directed for 5 days for your sinus infection  Stay on your albuterol as needed and Zyrtec daily  Try to get the orange card [facility] discount approved so we can send you to general surgery to evaluate your hernia  Continue to follow a healthy diet your blood pressure is borderline high however not high enough to receive medication  Return to see Dr. Joya [name] 5 months  Pick up a fecal occult stool kit and process it again as you did last year to screen you for colon cancer this is an annual need

## 2021-08-23 NOTE — Assessment & Plan Note (Signed)
Continued observation of the inguinal hernia is advised

## 2021-09-23 ENCOUNTER — Other Ambulatory Visit: Payer: Self-pay

## 2021-09-23 ENCOUNTER — Ambulatory Visit: Payer: Self-pay | Attending: Nurse Practitioner | Admitting: Nurse Practitioner

## 2021-09-23 DIAGNOSIS — J208 Acute bronchitis due to other specified organisms: Secondary | ICD-10-CM

## 2021-09-23 MED ORDER — PROMETHAZINE-DM 6.25-15 MG/5ML PO SYRP: 5.0000 mL | ORAL_SOLUTION | Freq: Four times a day (QID) | ORAL | 0 refills | Status: DC | PRN

## 2021-09-23 NOTE — Progress Notes (Signed)
Virtual Visit via Telephone Note Due to national recommendations of social distancing due to [place] 19, telehealth visit is felt to be most appropriate for this patient at this time.  I discussed the limitations, risks, security and privacy concerns of performing an evaluation and management service by telephone and the availability of in person appointments. I also discussed with the patient that there may be a patient responsible charge related to this service. The patient expressed understanding and agreed to proceed.    I connected with [name] on 09/23/21  at   4:10 PM EST  EDT by telephone and verified that I am speaking with the correct person using two identifiers.  Location of Patient: Private Residence   Location of Provider: [place] participating in Telemedicine visit: [name]-BC KENNEN STAMMER    History of Present Illness: Telemedicine visit for: Cough  Mr Dansereau scheduled a virtual visit due to a cough he was experiencing. Today he states the cough and congestion have significantly improved. There is some residual cough and he is amenable to a cough syrup today. Denies fever, purulent sputum, shortness of breath or wheezing. He has no other URI symptoms.   Past Medical History:  Diagnosis Date   Asthma    COVID-19 virus infection 09/15/2020   Knee pain     Past Surgical History:  Procedure Laterality Date   KNEE ARTHROSCOPY     x 2    Family History  Problem Relation Age of Onset   Hypertension Mother    Cancer Father    Cancer Other     Social History   Socioeconomic History   Marital status: Single    Spouse name: Not on file   Number of children: Not on file   Years of education: Not on file   Highest education level: Not on file  Occupational History   Not on file  Tobacco Use   Smoking status: Former   Smokeless tobacco: Never  Vaping Use   Vaping Use: Never used  Substance  and Sexual Activity   Alcohol use: No   Drug use: No   Sexual activity: Yes  Other Topics Concern   Not on file  Social History Narrative   Not on file   Social Determinants of Health   Financial Resource Strain: Not on file  Food Insecurity: Not on file  Transportation Needs: Not on file  Physical Activity: Not on file  Stress: Not on file  Social Connections: Not on file     Observations/Objective: Awake, alert and oriented x 3   Review of Systems  Constitutional:  Negative for fever, malaise/fatigue and weight loss.  HENT: Negative.  Negative for nosebleeds.   Eyes: Negative.  Negative for blurred vision, double vision and photophobia.  Respiratory:  Positive for cough. Negative for sputum production, shortness of breath and wheezing.   Cardiovascular: Negative.  Negative for chest pain, palpitations and leg swelling.  Gastrointestinal: Negative.  Negative for heartburn, nausea and vomiting.  Musculoskeletal: Negative.  Negative for myalgias.  Neurological: Negative.  Negative for dizziness, focal weakness, seizures and headaches.  Psychiatric/Behavioral: Negative.  Negative for suicidal ideas.    Assessment and Plan: Diagnoses and all orders for this visit:  Acute viral bronchitis -     promethazine-dextromethorphan (PROMETHAZINE-DM) 6.25-15 MG/5ML syrup; Take 5 mLs by mouth 4 (four) times daily as needed for cough. NEEDS PASS     Follow Up Instructions Return if  symptoms worsen or fail to improve.     I discussed the assessment and treatment plan with the patient. The patient was provided an opportunity to ask questions and all were answered. The patient agreed with the plan and demonstrated an understanding of the instructions.   The patient was advised to call back or seek an in-person evaluation if the symptoms worsen or if the condition fails to improve as anticipated.  I provided 11 minutes of non-face-to-face time during this encounter including median  intraservice time, reviewing previous notes, labs, imaging, medications and explaining diagnosis and management.  Gildardo Pounds, FNP-BC

## 2021-09-25 ENCOUNTER — Encounter: Payer: Self-pay | Admitting: Nurse Practitioner

## 2021-09-30 ENCOUNTER — Encounter (HOSPITAL_COMMUNITY): Payer: Self-pay | Admitting: Emergency Medicine

## 2021-09-30 ENCOUNTER — Emergency Department (HOSPITAL_COMMUNITY): Payer: Self-pay

## 2021-09-30 ENCOUNTER — Other Ambulatory Visit: Payer: Self-pay

## 2021-09-30 ENCOUNTER — Emergency Department (HOSPITAL_COMMUNITY)
Admission: EM | Admit: 2021-09-30 | Discharge: 2021-10-01 | Disposition: A | Discharge status: Home / Self Care | Payer: Self-pay | Attending: Emergency Medicine | Admitting: Emergency Medicine

## 2021-09-30 DIAGNOSIS — K219 Gastro-esophageal reflux disease without esophagitis: Secondary | ICD-10-CM

## 2021-09-30 DIAGNOSIS — Z20822 Contact with and (suspected) exposure to covid-19: Secondary | ICD-10-CM | POA: Insufficient documentation

## 2021-09-30 DIAGNOSIS — J45909 Unspecified asthma, uncomplicated: Secondary | ICD-10-CM | POA: Insufficient documentation

## 2021-09-30 DIAGNOSIS — R051 Acute cough: Secondary | ICD-10-CM

## 2021-09-30 LAB — RESP PANEL BY RT-PCR (FLU A&B, COVID) ARPGX2
Influenza A by PCR: NEGATIVE
Influenza B by PCR: NEGATIVE
SARS Coronavirus 2 by RT PCR: NEGATIVE

## 2021-09-30 NOTE — ED Triage Notes (Signed)
Patient reports feeling bloated , abdominal fullness with productive cough onset this week .

## 2021-09-30 NOTE — ED Provider Triage Note (Signed)
Emergency Medicine Provider Triage Evaluation Note  Zachary [name] , a 61 y.o. male  was evaluated in triage.  Pt complains of cough and congestion for a week  Review of Systems  Positive: cough Negative: fever  Physical Exam  Ht 5\' 6"  (1.676 m)    Wt 75 kg    BMI 26.69 kg/m  Gen:   Awake, no distress   Resp:  Normal effort  MSK:   Moves extremities without difficulty  Other:    Medical Decision Making  Medically screening exam initiated at 8:29 PM.  Appropriate orders placed.  Zachary [name] was informed that the remainder of the evaluation will be completed by another provider, this initial triage assessment does not replace that evaluation, and the importance of remaining in the ED until their evaluation is complete.     Zachary [name], [place] 09/30/21 2030

## 2021-10-01 MED ORDER — SUCRALFATE 1 G PO TABS: 1.0000 g | ORAL_TABLET | Freq: Three times a day (TID) | ORAL | 0 refills | Status: DC

## 2021-10-01 MED ORDER — PANTOPRAZOLE SODIUM 20 MG PO TBEC: 20.0000 mg | DELAYED_RELEASE_TABLET | Freq: Every day | ORAL | 0 refills | Status: DC

## 2021-10-01 NOTE — Discharge Instructions (Addendum)
I have prescribed you 2 medications, versus Protonix which is a daily medicine you can take to reduce your acid reflux symptoms.  Additionally I have prescribed a medicine called Carafate that you can take with eating to decrease the feeling of burning you have after meals. I recommend avoiding foods that may worsen your acid reflux symptoms.  For your cough for the last 3 days, your chest x-ray, COVID and flu test were negative, your symptoms are consistent with an upper respiratory infection of viral origin, continue to use cough syrup, Mucinex as needed for congestion, and your symptoms should improve in the next few days.  I recommend that you follow-up with your primary care provider for further evaluation, and recheck.

## 2021-10-01 NOTE — ED Provider Notes (Signed)
[facility] EMERGENCY DEPARTMENT Provider Note   CSN: [id number] Arrival date & time: 09/30/21  1919     History  Chief Complaint  Patient presents with   Cough /Bloated    Zachary [name] is a 61 y.o. male with past medical history significant for GERD, mild intermittent asthma who presents with 3 days of cough, with some increase in bloating, abdominal fullness, acid reflux symptoms.  Patient denies significant abdominal pain, chest pain, shortness of breath.  Patient endorses some productive cough with clear to yellowish sputum.  Patient reports occasional feeling of fever chills, with no objective fever.  Patient denies any vomiting, but has felt minimally nauseous.  Patient reports that he has been using promethazine cough syrup, Mucinex which has relieved his symptoms to some extent.   HPI     Home Medications Prior to Admission medications   Medication Sig Start Date End Date Taking? Authorizing Provider  pantoprazole (PROTONIX) 20 MG tablet Take 1 tablet (20 mg total) by mouth daily. 10/01/21  Yes Haydn Hutsell H, PA-C  sucralfate (CARAFATE) 1 g tablet Take 1 tablet (1 g total) by mouth 4 (four) times daily -  with meals and at bedtime. 10/01/21  Yes Siarra Gilkerson H, PA-C  albuterol (VENTOLIN HFA) 108 (90 Base) MCG/ACT inhaler Inhale 2 puffs into the lungs every 6 (six) hours as needed for wheezing or shortness of breath (cough). 09/15/20   Elsie Stain, MD  cetirizine (ZYRTEC) 10 MG tablet TAKE 1 TABLET (10 MG TOTAL) BY MOUTH DAILY. 11/15/20 11/15/21  Elsie Stain, MD  fluticasone (FLONASE) 50 MCG/ACT nasal spray PLACE 2 SPRAYS INTO BOTH NOSTRILS DAILY. 03/17/21 03/17/22  Elsie Stain, MD  promethazine-dextromethorphan (PROMETHAZINE-DM) 6.25-15 MG/5ML syrup Take 5 mLs by mouth 4 (four) times daily as needed for cough. NEEDS PASS 09/23/21   Gildardo Pounds, NP      Allergies    Patient has no known allergies.    Review of Systems    Review of Systems  Respiratory:  Positive for cough.   All other systems reviewed and are negative.  Physical Exam Updated Vital Signs BP (!) 148/96 (BP Location: Right Arm)    Pulse 96    Temp 97.6 F (36.4 C) (Oral)    Resp 16    Ht 5\' 6"  (1.676 m)    Wt 75 kg    SpO2 99%    BMI 26.69 kg/m  Physical Exam Vitals and nursing note reviewed.  Constitutional:      General: He is not in acute distress.    Appearance: Normal appearance.  HENT:     Head: Normocephalic and atraumatic.  Eyes:     General:        Right eye: No discharge.        Left eye: No discharge.  Cardiovascular:     Rate and Rhythm: Normal rate and regular rhythm.     Heart sounds: No murmur heard.   No friction rub. No gallop.  Pulmonary:     Effort: Pulmonary effort is normal.     Breath sounds: Normal breath sounds.     Comments: Clear lung sounds throughout, no wheezing, no rhonchi Abdominal:     General: Bowel sounds are normal.     Palpations: Abdomen is soft.     Comments: No visible distention, generalized tenderness to palpation in the epigastric region, no rebound, rigidity, guarding.  No focal tenderness throughout abdomen.  Normal bowel sounds throughout.  No  Murphy sign.  Skin:    General: Skin is warm and dry.     Capillary Refill: Capillary refill takes less than 2 seconds.  Neurological:     Mental Status: He is alert and oriented to person, place, and time.  Psychiatric:        Mood and Affect: Mood normal.        Behavior: Behavior normal.    ED Results / Procedures / Treatments   Labs (all labs ordered are listed, but only abnormal results are displayed) Labs Reviewed  RESP PANEL BY RT-PCR (FLU A&B, COVID) [id number]    EKG None  Radiology DG Chest 2 View  Result Date: 09/30/2021 CLINICAL DATA:  Cough. EXAM: CHEST - 2 VIEW COMPARISON:  Chest x-ray 11/14/2020. FINDINGS: The heart size and mediastinal contours are within normal limits. Both lungs are clear. The visualized skeletal  structures are unremarkable. IMPRESSION: No active cardiopulmonary disease. Electronically Signed   By: Ronney Asters M.D.   On: 09/30/2021 21:03    Procedures Procedures    Medications Ordered in ED Medications - No data to display  ED Course/ Medical Decision Making/ A&P                           Medical Decision Making Amount and/or Complexity of Data Reviewed Radiology: ordered.  Risk Prescription drug management.   Is an overall well-appearing patient who presents with 3 days of cough, feeling of abdominal fullness, and burning in throat with eating.  Differential diagnosis includes upper respiratory infection, acute bronchitis, asthma exacerbation, pneumonia.  Additionally considered gastroparesis, gastroenteritis, delayed emptying, small bowel obstruction, less clinical concern for acute mesenteric ischemia versus other.  Patient with negative respiratory virus panel.  I personally reviewed his lab findings. Patient with negative chest x-ray.  No intrathoracic abnormality.  I agree with radiologist interpretation.  My physical exam patient has clear lung sounds, no significant tenderness to palpation of the abdomen.  His story is consistent with upper respiratory infection, as well as acid reflux symptoms.  He is having active bowel movements without nausea, vomiting, diarrhea.  Minimal clinical concern for bowel obstruction, or delayed gastric emptying.  I recommend supportive care, Protonix, Carafate for acid reflux, and follow-up with PCP.  Patient understands and agrees to plan, discharged stable condition at this time. Final Clinical Impression(s) / ED Diagnoses Final diagnoses:  Acute cough  Gastroesophageal reflux disease without esophagitis    Rx / DC Orders ED Discharge Orders          Ordered    pantoprazole (PROTONIX) 20 MG tablet  Daily        10/01/21 0646    sucralfate (CARAFATE) 1 g tablet  3 times daily with meals & bedtime        10/01/21 0646               Anselmo Pickler, PA-C 10/01/21 1610    Ripley Fraise, MD 10/01/21 [phone]

## 2021-10-01 NOTE — ED Notes (Signed)
Ere due to cold like symptoms x 1 week

## 2021-10-10 NOTE — Progress Notes (Deleted)
Established Patient Office Visit  Subjective:  Patient ID: Zachary [name], male    DOB: December 24, 1960  Age: 61 y.o. MRN: [id number]  CC: No chief complaint on file.   HPI AKI ABALOS presents for  In ED 1/20 for acute cough  FIT Past Medical History:  Diagnosis Date   Asthma    COVID-19 virus infection 09/15/2020   Knee pain     Past Surgical History:  Procedure Laterality Date   KNEE ARTHROSCOPY     x 2    Family History  Problem Relation Age of Onset   Hypertension Mother    Cancer Father    Cancer Other     Social History   Socioeconomic History   Marital status: Single    Spouse name: Not on file   Number of children: Not on file   Years of education: Not on file   Highest education level: Not on file  Occupational History   Not on file  Tobacco Use   Smoking status: Former   Smokeless tobacco: Never  Vaping Use   Vaping Use: Never used  Substance and Sexual Activity   Alcohol use: No   Drug use: No   Sexual activity: Yes  Other Topics Concern   Not on file  Social History Narrative   Not on file   Social Determinants of Health   Financial Resource Strain: Not on file  Food Insecurity: Not on file  Transportation Needs: Not on file  Physical Activity: Not on file  Stress: Not on file  Social Connections: Not on file  Intimate Partner Violence: Not on file    Outpatient Medications Prior to Visit  Medication Sig Dispense Refill   albuterol (VENTOLIN HFA) 108 (90 Base) MCG/ACT inhaler Inhale 2 puffs into the lungs every 6 (six) hours as needed for wheezing or shortness of breath (cough). 18 g 0   cetirizine (ZYRTEC) 10 MG tablet TAKE 1 TABLET (10 MG TOTAL) BY MOUTH DAILY. 30 tablet 11   fluticasone (FLONASE) 50 MCG/ACT nasal spray PLACE 2 SPRAYS INTO BOTH NOSTRILS DAILY. 16 g 6   pantoprazole (PROTONIX) 20 MG tablet Take 1 tablet (20 mg total) by mouth daily. 30 tablet 0   promethazine-dextromethorphan (PROMETHAZINE-DM) 6.25-15  MG/5ML syrup Take 5 mLs by mouth 4 (four) times daily as needed for cough. NEEDS PASS 118 mL 0   sucralfate (CARAFATE) 1 g tablet Take 1 tablet (1 g total) by mouth 4 (four) times daily -  with meals and at bedtime. 30 tablet 0   No facility-administered medications prior to visit.    No Known Allergies  ROS Review of Systems    Objective:    Physical Exam  There were no vitals taken for this visit. Wt Readings from Last 3 Encounters:  09/30/21 165 lb 5.5 oz (75 kg)  08/23/21 146 lb 9.6 oz (66.5 kg)  03/17/21 141 lb 12.8 oz (64.3 kg)     Health Maintenance Due  Topic Date Due   COLON CANCER SCREENING ANNUAL FOBT  03/18/2021    There are no preventive care reminders to display for this patient.  No results found for: TSH Lab Results  Component Value Date   WBC 5.0 11/14/2020   HGB 13.9 11/14/2020   HCT 41.1 11/14/2020   MCV 90.5 11/14/2020   PLT 250 11/14/2020   Lab Results  Component Value Date   NA 137 11/14/2020   K 3.5 11/14/2020   CO2 27 11/14/2020   GLUCOSE  139 (H) 11/14/2020   BUN 12 11/14/2020   CREATININE 0.87 11/14/2020   BILITOT 0.9 05/18/2020   ALKPHOS 67 05/18/2020   AST 20 05/18/2020   ALT 27 05/18/2020   PROT 8.0 05/18/2020   ALBUMIN 4.2 05/18/2020   CALCIUM 9.6 11/14/2020   ANIONGAP 8 11/14/2020   Lab Results  Component Value Date   CHOL 201 (H) 07/15/2007   Lab Results  Component Value Date   HDL 40 07/15/2007   Lab Results  Component Value Date   LDLCALC 119 (H) 07/15/2007   Lab Results  Component Value Date   TRIG 209 (H) 07/15/2007   Lab Results  Component Value Date   CHOLHDL 5.0 Ratio 07/15/2007   No results found for: HGBA1C    Assessment & Plan:   Problem List Items Addressed This Visit   None   No orders of the defined types were placed in this encounter.   Follow-up: No follow-ups on file.    Asencion Noble, MD

## 2021-10-11 ENCOUNTER — Other Ambulatory Visit: Payer: Self-pay

## 2021-10-11 ENCOUNTER — Ambulatory Visit: Payer: Self-pay | Attending: Critical Care Medicine | Admitting: Critical Care Medicine

## 2021-10-11 ENCOUNTER — Encounter: Payer: Self-pay | Admitting: Critical Care Medicine

## 2021-10-11 ENCOUNTER — Ambulatory Visit
Admission: RE | Admit: 2021-10-11 | Discharge: 2021-10-11 | Disposition: A | Discharge status: Home / Self Care | Payer: Self-pay | Source: Ambulatory Visit | Attending: Critical Care Medicine | Admitting: Critical Care Medicine

## 2021-10-11 VITALS — BP 122/79 | HR 99 | Resp 16 | Wt 149.4 lb

## 2021-10-11 DIAGNOSIS — K219 Gastro-esophageal reflux disease without esophagitis: Secondary | ICD-10-CM

## 2021-10-11 DIAGNOSIS — J4520 Mild intermittent asthma, uncomplicated: Secondary | ICD-10-CM

## 2021-10-11 DIAGNOSIS — J0101 Acute recurrent maxillary sinusitis: Secondary | ICD-10-CM

## 2021-10-11 DIAGNOSIS — J301 Allergic rhinitis due to pollen: Secondary | ICD-10-CM

## 2021-10-11 DIAGNOSIS — K029 Dental caries, unspecified: Secondary | ICD-10-CM

## 2021-10-11 DIAGNOSIS — K056 Periodontal disease, unspecified: Secondary | ICD-10-CM

## 2021-10-11 HISTORY — PX: ESOPHAGOGASTRODUODENOSCOPY ENDOSCOPY: SHX5814

## 2021-10-11 MED ORDER — PREDNISONE 10 MG PO TABS
ORAL_TABLET | ORAL | 0 refills | Status: DC
  Filled 2021-10-11: qty 20, 5d supply, fill #0

## 2021-10-11 MED ORDER — DOXYCYCLINE HYCLATE 100 MG PO TABS
100.0000 mg | ORAL_TABLET | Freq: Two times a day (BID) | ORAL | 0 refills | Status: DC
  Filled 2021-10-11: qty 20, 10d supply, fill #0

## 2021-10-11 NOTE — Patient Instructions (Addendum)
We have sent an antibiotic, doxycycline, to the pharmacy for your sinus infection. Take 1 tablet by mouth twice daily.   We also sent prednisone to the pharmacy to help with your chest tightness and the inflammation. Take 4 tablets daily for five days.  Continue using Flonase for nasal congestion.  An order for an X-ray of your sinuses was placed. Please have this done at your earliest convenience.  We have placed a referral for a dentist. Once as you get your orange card application finished you should be able to see them.

## 2021-10-11 NOTE — Assessment & Plan Note (Signed)
Patient has another acute presentation of maxillary sinusitis. Another course of antibiotics initiated, doxycycline, 100 mg twice daily for 10 days. Additionally, course of oral prednisone 4, 10-mg tablets for 5 days to treat the marked inflammation of the sinuses as well as his chest tightness.  Due to the recurrent nature of his sinusitis, order for X-rays of his maxillary sinuses have been ordered to assess for etiology or pathology leading to these recurrent sinus infections.

## 2021-10-11 NOTE — Assessment & Plan Note (Signed)
Patient will continue pantoprazole and carafate to manage GERD symptoms.

## 2021-10-11 NOTE — Assessment & Plan Note (Signed)
Patient has severe periodontal disease. He is working on getting his orange card application completed. Referral placed to dentistry so that he can see a dentist as soon as he obtains the orange card.

## 2021-10-11 NOTE — Assessment & Plan Note (Signed)
Patient's asthma is stable with occasional mild symptoms and occasional use of albuterol inhaler. Continue with albuterol as needed.

## 2021-10-11 NOTE — Assessment & Plan Note (Signed)
Continue Flonase and cetirizine to treat allergic rhinitis; may also help with the sinusitis symptoms he is currently experiencing.

## 2021-10-11 NOTE — Progress Notes (Signed)
Established Patient Office Visit  Subjective:  Patient ID: Zachary [name], male    DOB: 16-Sep-1960  Age: 61 y.o. MRN: [id number]  CC:  Chief Complaint  Patient presents with   Hospitalization Follow-up    HPI Zachary [name] presents for follow up after ED visit for cough. This problem has persisted for a long time, but has been worse over the past two weeks. He last saw Dr. Joya [name] in this office on 08/23/2021 and was diagnosed with acute recurrent maxillary sinusitis which was treated with a 5 day course of azithromycin. This helped with his symptoms for awhile but the problem returned.   He saw [name] for this issue on 09/23/2021 via telemedicine visit. At that time, he was diagnosed with acute bronchitis and prescribed promethazine cough syrup. He then presented to the ED on 09/30/21 for productive cough, bloating, abdominal fullness, and reflux symptoms. ED workup revealed no additional findings. At that time, supportive care was recommended for the cough, and treatment of GERD symptoms with Protonix and Carafate.   He reports that today he is still experiencing productive cough, shortness of breath, chest tightness, and some wheezing. He still has congestion, sinus pain, rhinorrhea, and sinus pressure. His chest hurts from the amount of coughing he has been doing. He is unaware of fevers, but he has been waking up with sweating at night occasionally for the past few days. He also has headaches throughout the day. He has been feeling a lot of nausea that he thinks is related to the cough. He has used cough syrup as prescribed as well as Mucinex, Flonase, and cetirizine daily. He thinks that these help, but only for a short amount of time. This issue has left him feeling very fatigued, but otherwise his mood is fine.  Past Medical History:  Diagnosis Date   Asthma    COVID-19 virus infection 09/15/2020   Knee pain     Past Surgical History:  Procedure Laterality Date    KNEE ARTHROSCOPY     x 2    Family History  Problem Relation Age of Onset   Hypertension Mother    Cancer Father    Cancer Other     Social History   Socioeconomic History   Marital status: Single    Spouse name: Not on file   Number of children: Not on file   Years of education: Not on file   Highest education level: Not on file  Occupational History   Not on file  Tobacco Use   Smoking status: Former   Smokeless tobacco: Never  Vaping Use   Vaping Use: Never used  Substance and Sexual Activity   Alcohol use: No   Drug use: No   Sexual activity: Yes  Other Topics Concern   Not on file  Social History Narrative   Not on file   Social Determinants of Health   Financial Resource Strain: Not on file  Food Insecurity: Not on file  Transportation Needs: Not on file  Physical Activity: Not on file  Stress: Not on file  Social Connections: Not on file  Intimate Partner Violence: Not on file    Outpatient Medications Prior to Visit  Medication Sig Dispense Refill   albuterol (VENTOLIN HFA) 108 (90 Base) MCG/ACT inhaler Inhale 2 puffs into the lungs every 6 (six) hours as needed for wheezing or shortness of breath (cough). 18 g 0   cetirizine (ZYRTEC) 10 MG tablet TAKE 1 TABLET (10 MG TOTAL)  BY MOUTH DAILY. 30 tablet 11   fluticasone (FLONASE) 50 MCG/ACT nasal spray PLACE 2 SPRAYS INTO BOTH NOSTRILS DAILY. 16 g 6   pantoprazole (PROTONIX) 20 MG tablet Take 1 tablet (20 mg total) by mouth daily. 30 tablet 0   promethazine-dextromethorphan (PROMETHAZINE-DM) 6.25-15 MG/5ML syrup Take 5 mLs by mouth 4 (four) times daily as needed for cough. NEEDS PASS 118 mL 0   sucralfate (CARAFATE) 1 g tablet Take 1 tablet (1 g total) by mouth 4 (four) times daily -  with meals and at bedtime. 30 tablet 0   No facility-administered medications prior to visit.    No Known Allergies  ROS Review of Systems  Constitutional:  Positive for diaphoresis and fatigue. Negative for fever.   HENT:  Positive for congestion, rhinorrhea, sinus pressure and sinus pain. Negative for ear pain, postnasal drip and sore throat.   Eyes:  Positive for visual disturbance.  Respiratory:  Positive for cough, chest tightness, shortness of breath and wheezing.   Cardiovascular:  Positive for chest pain.  Gastrointestinal:  Positive for nausea. Negative for constipation and diarrhea.  Endocrine: Negative.   Genitourinary: Negative.   Musculoskeletal:  Positive for myalgias.  Skin: Negative.   Neurological:  Positive for headaches.  Psychiatric/Behavioral:  Positive for sleep disturbance. Negative for dysphoric mood.      Objective:    Physical Exam Vitals reviewed.  Constitutional:      General: He is not in acute distress.    Appearance: Normal appearance. He is normal weight.  HENT:     Head: Normocephalic and atraumatic.     Right Ear: Tympanic membrane, ear canal and external ear normal. Impacted cerumen: non-impacted cerumen present.     Left Ear: Tympanic membrane, ear canal and external ear normal. Impacted cerumen: non-impacted cerumen present.     Nose:     Right Turbinates: Swollen (pus visualized).     Left Turbinates: Swollen.     Right Sinus: Frontal sinus tenderness present.     Left Sinus: Frontal sinus tenderness present.     Mouth/Throat:     Mouth: Mucous membranes are moist.     Dentition: Abnormal dentition.     Pharynx: Oropharyngeal exudate (significant postnasal drip visualized) present.     Tonsils: No tonsillar exudate.  Eyes:     General: No scleral icterus. Cardiovascular:     Rate and Rhythm: Normal rate and regular rhythm.     Heart sounds: No murmur heard.   No friction rub. No gallop.  Pulmonary:     Effort: Pulmonary effort is normal.     Breath sounds: No wheezing, rhonchi or rales.  Musculoskeletal:     Cervical back: Neck supple. No tenderness.  Lymphadenopathy:     Cervical: No cervical adenopathy.  Skin:    General: Skin is warm and  dry.  Neurological:     General: No focal deficit present.     Mental Status: He is alert and oriented to person, place, and time.  Psychiatric:        Mood and Affect: Mood normal.        Behavior: Behavior normal.        Thought Content: Thought content normal.        Judgment: Judgment normal.    BP 122/79    Pulse 99    Resp 16    Wt 67.8 kg    SpO2 97%    BMI 24.11 kg/m  Wt Readings from Last 3 Encounters:  10/11/21  67.8 kg  09/30/21 75 kg  08/23/21 66.5 kg     Health Maintenance Due  Topic Date Due   COLON CANCER SCREENING ANNUAL FOBT  03/18/2021    There are no preventive care reminders to display for this patient.  No results found for: TSH Lab Results  Component Value Date   WBC 5.0 11/14/2020   HGB 13.9 11/14/2020   HCT 41.1 11/14/2020   MCV 90.5 11/14/2020   PLT 250 11/14/2020   Lab Results  Component Value Date   NA 137 11/14/2020   K 3.5 11/14/2020   CO2 27 11/14/2020   GLUCOSE 139 (H) 11/14/2020   BUN 12 11/14/2020   CREATININE 0.87 11/14/2020   BILITOT 0.9 05/18/2020   ALKPHOS 67 05/18/2020   AST 20 05/18/2020   ALT 27 05/18/2020   PROT 8.0 05/18/2020   ALBUMIN 4.2 05/18/2020   CALCIUM 9.6 11/14/2020   ANIONGAP 8 11/14/2020   Lab Results  Component Value Date   CHOL 201 (H) 07/15/2007   Lab Results  Component Value Date   HDL 40 07/15/2007   Lab Results  Component Value Date   LDLCALC 119 (H) 07/15/2007   Lab Results  Component Value Date   TRIG 209 (H) 07/15/2007   Lab Results  Component Value Date   CHOLHDL 5.0 Ratio 07/15/2007   No results found for: HGBA1C    Assessment & Plan:   Problem List Items Addressed This Visit       Respiratory   Mild intermittent asthma without complication    Patient's asthma is stable with occasional mild symptoms and occasional use of albuterol inhaler. Continue with albuterol as needed.      Relevant Medications   predniSONE (DELTASONE) 10 MG tablet   Allergic rhinitis     Continue Flonase and cetirizine to treat allergic rhinitis; may also help with the sinusitis symptoms he is currently experiencing.       Acute recurrent maxillary sinusitis - Primary    Patient has another acute presentation of maxillary sinusitis. Another course of antibiotics initiated, doxycycline, 100 mg twice daily for 10 days. Additionally, course of oral prednisone 4, 10-mg tablets for 5 days to treat the marked inflammation of the sinuses as well as his chest tightness.  Due to the recurrent nature of his sinusitis, order for X-rays of his maxillary sinuses have been ordered to assess for etiology or pathology leading to these recurrent sinus infections.       Relevant Medications   doxycycline (VIBRA-TABS) 100 MG tablet   predniSONE (DELTASONE) 10 MG tablet   Other Relevant Orders   DG Sinuses Complete     Digestive   Periodontal disease    Patient has severe periodontal disease. He is working on getting his orange card application completed. Referral placed to dentistry so that he can see a dentist as soon as he obtains the orange card.       Relevant Orders   Ambulatory referral to Dentistry   Dental caries   Relevant Orders   Ambulatory referral to Dentistry   Gastroesophageal reflux disease without esophagitis    Patient will continue pantoprazole and carafate to manage GERD symptoms.        Meds ordered this encounter  Medications   doxycycline (VIBRA-TABS) 100 MG tablet    Sig: Take 1 tablet (100 mg total) by mouth 2 (two) times daily.    Dispense:  20 tablet    Refill:  0   predniSONE (DELTASONE) 10 MG  tablet    Sig: Take 4 tablets daily for 5 days then stop    Dispense:  20 tablet    Refill:  0    Follow-up: No follow-ups on file.    Fontaine No, Student-PA

## 2021-10-12 ENCOUNTER — Telehealth: Payer: Self-pay

## 2021-10-12 NOTE — Telephone Encounter (Signed)
Pt was called and is aware of results, DOB was confirmed.  ?

## 2021-10-12 NOTE — Telephone Encounter (Signed)
-----   Message from Elsie Stain, MD sent at 10/12/2021  1:05 PM EST ----- Let pt know sinus films NORMAL  so finish current antibiotics and treatments and return for follow up in may, sooner if needed.  ENT referral not needed

## 2021-10-14 ENCOUNTER — Other Ambulatory Visit: Payer: Self-pay

## 2021-10-14 ENCOUNTER — Emergency Department (HOSPITAL_COMMUNITY): Payer: Self-pay

## 2021-10-14 ENCOUNTER — Ambulatory Visit: Payer: Self-pay

## 2021-10-14 ENCOUNTER — Emergency Department (HOSPITAL_COMMUNITY)
Admission: EM | Admit: 2021-10-14 | Discharge: 2021-10-14 | Disposition: A | Discharge status: Home / Self Care | Payer: Self-pay | Attending: Emergency Medicine | Admitting: Emergency Medicine

## 2021-10-14 ENCOUNTER — Encounter (HOSPITAL_COMMUNITY): Payer: Self-pay | Admitting: Emergency Medicine

## 2021-10-14 DIAGNOSIS — R1084 Generalized abdominal pain: Secondary | ICD-10-CM | POA: Insufficient documentation

## 2021-10-14 LAB — COMPREHENSIVE METABOLIC PANEL
ALT: 24 U/L (ref 0–44)
AST: 21 U/L (ref 15–41)
Albumin: 3.9 g/dL (ref 3.5–5.0)
Alkaline Phosphatase: 59 U/L (ref 38–126)
Anion gap: 6 (ref 5–15)
BUN: 12 mg/dL (ref 6–20)
CO2: 28 mmol/L (ref 22–32)
Calcium: 9 mg/dL (ref 8.9–10.3)
Chloride: 105 mmol/L (ref 98–111)
Creatinine, Ser: 1.21 mg/dL (ref 0.61–1.24)
GFR, Estimated: >60 mL/min (ref >60)
Glucose, Bld: 75 mg/dL (ref 70–99)
Potassium: 4 mmol/L (ref 3.5–5.1)
Sodium: 139 mmol/L (ref 135–145)
Total Bilirubin: 0.6 mg/dL (ref 0.3–1.2)
Total Protein: 7.2 g/dL (ref 6.5–8.1)

## 2021-10-14 LAB — CBC WITH DIFFERENTIAL/PLATELET
Abs Immature Granulocytes: 0.01 10*3/uL (ref 0.00–0.07)
Basophils Absolute: 0 10*3/uL (ref 0.0–0.1)
Basophils Relative: 0 %
Eosinophils Absolute: 0.1 10*3/uL (ref 0.0–0.5)
Eosinophils Relative: 2 %
HCT: 40.6 % (ref 39.0–52.0)
Hemoglobin: 13.9 g/dL (ref 13.0–17.0)
Immature Granulocytes: 0 %
Lymphocytes Relative: 53 %
Lymphs Abs: 2.5 10*3/uL (ref 0.7–4.0)
MCH: 30.6 pg (ref 26.0–34.0)
MCHC: 34.2 g/dL (ref 30.0–36.0)
MCV: 89.4 fL (ref 80.0–100.0)
Monocytes Absolute: 0.6 10*3/uL (ref 0.1–1.0)
Monocytes Relative: 13 %
Neutro Abs: 1.5 10*3/uL — ABNORMAL LOW (ref 1.7–7.7)
Neutrophils Relative %: 32 %
Platelets: 259 10*3/uL (ref 150–400)
RBC: 4.54 MIL/uL (ref 4.22–5.81)
RDW: 12.7 % (ref 11.5–15.5)
WBC: 4.8 10*3/uL (ref 4.0–10.5)
nRBC: 0 % (ref 0.0–0.2)

## 2021-10-14 LAB — LIPASE, BLOOD: Lipase: 48 U/L (ref 11–51)

## 2021-10-14 LAB — URINALYSIS, ROUTINE W REFLEX MICROSCOPIC
Bilirubin Urine: NEGATIVE
Glucose, UA: NEGATIVE mg/dL
Hgb urine dipstick: NEGATIVE
Ketones, ur: NEGATIVE mg/dL
Leukocytes,Ua: NEGATIVE
Nitrite: NEGATIVE
Protein, ur: NEGATIVE mg/dL
Specific Gravity, Urine: 1.025 (ref 1.005–1.030)
pH: 6 (ref 5.0–8.0)

## 2021-10-14 MED ORDER — PANTOPRAZOLE SODIUM 40 MG IV SOLR
40.0000 mg | Freq: Once | INTRAVENOUS | Status: AC
  Administered 2021-10-14: 40 mg via INTRAVENOUS
  Filled 2021-10-14: qty 40

## 2021-10-14 MED ORDER — DICYCLOMINE HCL 20 MG PO TABS: 20.0000 mg | ORAL_TABLET | Freq: Two times a day (BID) | ORAL | 0 refills | Status: DC

## 2021-10-14 MED ORDER — ALUM & MAG HYDROXIDE-SIMETH 200-200-20 MG/5ML PO SUSP
30.0000 mL | Freq: Once | ORAL | Status: AC
  Administered 2021-10-14: 30 mL via ORAL
  Filled 2021-10-14: qty 30

## 2021-10-14 MED ORDER — LIDOCAINE VISCOUS HCL 2 % MT SOLN
15.0000 mL | Freq: Once | OROMUCOSAL | Status: AC
  Administered 2021-10-14: 15 mL via ORAL
  Filled 2021-10-14: qty 15

## 2021-10-14 NOTE — ED Provider Notes (Signed)
[facility] EMERGENCY DEPARTMENT Provider Note   CSN: [id number] Arrival date & time: 10/14/21  1851     History  Chief Complaint  Patient presents with   Abdominal Pain    Gastric Reflux    Zachary [name] is a 61 y.o. male.   Abdominal Pain  This patient is a 61 year old male, he denies any prior abdominal surgical history.  He has been prescribed a medication such as doxycycline for presumed sinusitis, Protonix for possible acid reflux at his last ER visit from about 2 weeks ago and a course of prednisone which she has been on for the last 3 or 4 days.  He has also been diagnosed with possible acid reflux and gastritis and given Carafate, he states that these medications are not helping that much.  He denies any other chronic medical conditions, he denies having any vomiting or diarrhea but has had this mid abdominal discomfort which seems to radiate outwards, seems to be rather persistent, he is tired of hurting and does not know what the causes.  Home Medications Prior to Admission medications   Medication Sig Start Date End Date Taking? Authorizing Provider  dicyclomine (BENTYL) 20 MG tablet Take 1 tablet (20 mg total) by mouth 2 (two) times daily. 10/14/21  Yes Noemi Chapel, MD  albuterol (VENTOLIN HFA) 108 (90 Base) MCG/ACT inhaler Inhale 2 puffs into the lungs every 6 (six) hours as needed for wheezing or shortness of breath (cough). 09/15/20   Elsie Stain, MD  cetirizine (ZYRTEC) 10 MG tablet TAKE 1 TABLET (10 MG TOTAL) BY MOUTH DAILY. 11/15/20 11/15/21  Elsie Stain, MD  doxycycline (VIBRA-TABS) 100 MG tablet Take 1 tablet (100 mg total) by mouth 2 (two) times daily. 10/11/21   Elsie Stain, MD  fluticasone (FLONASE) 50 MCG/ACT nasal spray PLACE 2 SPRAYS INTO BOTH NOSTRILS DAILY. 03/17/21 03/17/22  Elsie Stain, MD  pantoprazole (PROTONIX) 20 MG tablet Take 1 tablet (20 mg total) by mouth daily. 10/01/21   Prosperi, Christian H, PA-C  predniSONE  (DELTASONE) 10 MG tablet Take 4 tablets daily for 5 days then stop 10/11/21   Elsie Stain, MD  promethazine-dextromethorphan (PROMETHAZINE-DM) 6.25-15 MG/5ML syrup Take 5 mLs by mouth 4 (four) times daily as needed for cough. NEEDS PASS 09/23/21   Gildardo Pounds, NP  sucralfate (CARAFATE) 1 g tablet Take 1 tablet (1 g total) by mouth 4 (four) times daily -  with meals and at bedtime. 10/01/21   Prosperi, Christian H, PA-C      Allergies    Patient has no known allergies.    Review of Systems   Review of Systems  Gastrointestinal:  Positive for abdominal pain.  All other systems reviewed and are negative.  Physical Exam Updated Vital Signs BP (!) 137/95    Pulse (!) 59    Temp 97.7 F (36.5 C) (Oral)    Resp 13    Ht 1.676 m (5\' 6" )    Wt 75 kg    SpO2 97%    BMI 26.69 kg/m  Physical Exam Vitals and nursing note reviewed.  Constitutional:      General: He is not in acute distress.    Appearance: He is well-developed.  HENT:     Head: Normocephalic and atraumatic.     Mouth/Throat:     Pharynx: No oropharyngeal exudate.  Eyes:     General: No scleral icterus.       Right eye: No discharge.  Left eye: No discharge.     Conjunctiva/sclera: Conjunctivae normal.     Pupils: Pupils are equal, round, and reactive to light.  Neck:     Thyroid: No thyromegaly.     Vascular: No JVD.  Cardiovascular:     Rate and Rhythm: Normal rate and regular rhythm.     Heart sounds: Normal heart sounds. No murmur heard.   No friction rub. No gallop.  Pulmonary:     Effort: Pulmonary effort is normal. No respiratory distress.     Breath sounds: Normal breath sounds. No wheezing or rales.  Abdominal:     General: Bowel sounds are normal. There is no distension.     Palpations: Abdomen is soft. There is no mass.     Tenderness: There is abdominal tenderness.     Comments: There is no tympanitic sounds to percussion, the bowel sounds appear slightly slowed but within normal limits, he  does not appear distended and has minimal mid abdominal tenderness without any guarding or peritoneal signs, no Murphy sign, no tenderness at McBurney's point  Musculoskeletal:        General: No tenderness. Normal range of motion.     Cervical back: Normal range of motion and neck supple.  Lymphadenopathy:     Cervical: No cervical adenopathy.  Skin:    General: Skin is warm and dry.     Findings: No erythema or rash.  Neurological:     Mental Status: He is alert.     Coordination: Coordination normal.     Comments: Moves all 4 extremities, speech is clear, no distress, normal coordination  Psychiatric:        Behavior: Behavior normal.    ED Results / Procedures / Treatments   Labs (all labs ordered are listed, but only abnormal results are displayed) Labs Reviewed  CBC WITH DIFFERENTIAL/PLATELET - Abnormal; Notable for the following components:      Result Value   Neutro Abs 1.5 (*)    All other components within normal limits  COMPREHENSIVE METABOLIC PANEL  LIPASE, BLOOD  URINALYSIS, ROUTINE W REFLEX MICROSCOPIC    EKG None  Radiology No results found.  Procedures Procedures    Medications Ordered in ED Medications  pantoprazole (PROTONIX) injection 40 mg (40 mg Intravenous Given 10/14/21 2154)  alum & mag hydroxide-simeth (MAALOX/MYLANTA) 200-200-20 MG/5ML suspension 30 mL (30 mLs Oral Given 10/14/21 2155)    And  lidocaine (XYLOCAINE) 2 % viscous mouth solution 15 mL (15 mLs Oral Given 10/14/21 2155)    ED Course/ Medical Decision Making/ A&P                           Medical Decision Making Amount and/or Complexity of Data Reviewed Radiology: ordered.  Risk OTC drugs. Prescription drug management.   This patient presents to the ED for concern of abdominal discomfort, this involves an extensive number of treatment options, and is a complaint that carries with it a high risk of complications and morbidity.  The differential diagnosis includes pancreatitis,  cholecystitis, bowel obstruction, chronic constipation, gastritis, peptic ulcer disease, tumor   Co morbidities that complicate the patient evaluation  None   Additional history obtained:  Additional history obtained from electronic medical record External records from outside source obtained and reviewed including prior x-rays, outpatient visits with PCP   Lab Tests:  I Ordered, and personally interpreted labs.  The pertinent results include: CBC and metabolic panel are unremarkable   Imaging  Studies ordered:  I ordered a CT scan of the abdomen and pelvis but the patient refused stating that he was not wanting the study and was too claustrophobic, did not want any other medications   Cardiac Monitoring:  The patient was maintained on a cardiac monitor.  I personally viewed and interpreted the cardiac monitored which showed an underlying rhythm of: Normal sinus rhythm   Medicines ordered and prescription drug management:  I ordered medication including Protonix, GI cocktail for abdominal pain Reevaluation of the patient after these medicines showed that the patient improved I have reviewed the patients home medicines and have made adjustments as needed   Test Considered:  CT scan, patient refused   Critical Interventions:  Medications for peptic ulcer disease or gastritis, significant improvement   Reevaluation:  After the interventions noted above, I reevaluated the patient and found that they have :improved   Social Determinants of Health:  None   Dispostion:  After consideration of the diagnostic results and the patients response to treatment, I feel that the patent would benefit from discharge home with close follow-up, patient agreeable, home with dicyclomine.          Final Clinical Impression(s) / ED Diagnoses Final diagnoses:  Generalized abdominal pain    Rx / DC Orders ED Discharge Orders          Ordered    dicyclomine (BENTYL) 20  MG tablet  2 times daily        10/14/21 2325              Noemi Chapel, MD 10/14/21 2327

## 2021-10-14 NOTE — ED Triage Notes (Signed)
Patient reports generalized abdominal pain/bloated with gastric reflux for several days , denies emesis or diarrhea.

## 2021-10-14 NOTE — Discharge Instructions (Signed)
Please make sure that you are taking all of the medications that you have been prescribed by Dr. Joya [name] in the emergency department.  Please return to the emergency department for severe or worsening symptoms.  You should follow-up with your family doctor in the next 2 days

## 2021-10-14 NOTE — ED Provider Triage Note (Signed)
Emergency Medicine Provider Triage Evaluation Note  Zachary [name] , a 61 y.o. male  was evaluated in triage.  Pt complains of abdominal pain for the past couple days.  Has a history of gastric reflux.  Denies chest pain, shortness of breath, nausea, vomiting, diarrhea, constipation, fever, chills.  He has a history of heartburn and took his Prilosec last night.  He does not take his Prilosec consistently.  He also tried Pepto-Bismol with no relief in his symptoms.  Review of Systems  Positive: As per HPI above. Negative: Chest pain, shortness of breath  Physical Exam  BP 128/85 (BP Location: Right Arm)    Pulse 66    Temp 97.7 F (36.5 C) (Oral)    Resp 16    Ht 5\' 6"  (1.676 m)    Wt 75 kg    SpO2 98%    BMI 26.69 kg/m  Gen:   Awake, no distress   Resp:  Normal effort MSK:   Moves extremities without difficulty  Other:  Mild tenderness to palpation to upper abdomen.  Medical Decision Making  Medically screening exam initiated at 7:24 PM.  Appropriate orders placed.  Zachary [name] was informed that the remainder of the evaluation will be completed by another provider, this initial triage assessment does not replace that evaluation, and the importance of remaining in the ED until their evaluation is complete.   Zachary [name] A, PA-C 10/14/21 1930

## 2021-10-14 NOTE — Telephone Encounter (Signed)
°  Chief Complaint: abdominal pain Symptoms: abd pain that is constant and radiates into back at times Frequency: several days  Pertinent Negatives: Patient denies vomiting or diarrhea Disposition: [] ED /[x] Urgent Care (no appt availability in office) / [] Appointment(In office/virtual)/ []  [place] [facility]/ [] Home Care/ [] Refused Recommended Disposition /[] [place] [facility]/ []  Follow-up with PCP Additional Notes: pt had OV with Dr Joya [name] on 10/11/21, was prescribed pantoprazole and carafate and pt states that those meds dont help symptoms. He can take pepto at times and it will ease off for a hr or so and come right back. Advised pt no office appts and mobile unit will be back Tuesday but if symptoms get worse he can go to UC over the weekend and f/up with office on Monday. Pt agreed with plan.    Reason for Disposition  [1] MODERATE pain (e.g., interferes with normal activities) AND [2] pain comes and goes (cramps) AND [3] present > 24 hours  (Exception: pain with Vomiting or Diarrhea - see that Guideline)  Answer Assessment - Initial Assessment Questions 1. LOCATION: "Where does it hurt?"  Middle of stomach 2. RADIATION: "Does the pain shoot anywhere else?" (e.g., chest, back)  Yes to back 3. ONSET: "When did the pain begin?" (e.g., minutes, hours or days ago)  Several days 4. SEVERITY: "How bad is the pain?"  (e.g., Scale 1-10; mild, moderate, or severe)    - MILD (1-3): doesn't interfere with normal activities, abdomen soft and not tender to touch    - MODERATE (4-7): interferes with normal activities or awakens from sleep, abdomen tender to touch    - SEVERE (8-10): excruciating pain, doubled over, unable to do any normal activities  9 5. OTHER SYMPTOMS: "Do you have any other symptoms?" (e.g., back pain, diarrhea, fever, urination pain, vomiting)  Protocols used: Abdominal Pain - Male-A-AH

## 2021-10-17 NOTE — Telephone Encounter (Signed)
Fyi, called pt and he said he had gotten some medication and is feeling better but his hernia is bothering him (pulling sensation)

## 2021-10-17 NOTE — Telephone Encounter (Signed)
He MUST obtain the CT Abdomen I ordered  , he went to ED this weekend and they wanted to do it then but he declined imaging

## 2021-10-17 NOTE — Telephone Encounter (Signed)
Called pt and left vm to give me a call back

## 2021-10-18 ENCOUNTER — Telehealth: Payer: Self-pay

## 2021-10-18 NOTE — Telephone Encounter (Signed)
Copied from [place] [id number]. Topic: General - Other >> Oct 17, 2021  2:36 PM Tessa Lerner A wrote: Reason for CRM: The patient has returned a missed call from the practice  Please contact further when possible      Called pt to talk about doctors note on his abdominal pain. Pt says the ED gave him medication that has been working and he is not interested in doing the ct scan as recommended by Dr.Wright.

## 2021-11-04 ENCOUNTER — Ambulatory Visit: Payer: Self-pay | Admitting: *Deleted

## 2021-11-04 NOTE — Telephone Encounter (Signed)
°  Chief Complaint: sinus congestion Symptoms: sinus pain, green nasal discharge Frequency: 1 week Pertinent Negatives: Patient denies fever, sore throat Disposition: [] ED /[x] Urgent Care (no appt availability in office) / [] Appointment(In office/virtual)/ []  [facility] [facility]/ [] Home Care/ [] Refused Recommended Disposition /[] [place] [facility]/ []  Follow-up with PCP Additional Notes: Patient advised no open appointment and provider not in office today for medication request. Advised UC/UC virtual

## 2021-11-04 NOTE — Telephone Encounter (Signed)
Summary: Request medication for sinus congestion   Patient called in to inquire of Dr Joya [name] if he can send an Rx to the pharmacy for him say that he is having issues with sinus congestion mainly in his head with sinus pressure above his eyes and nasal cavity all in his face. Per patient he have had sinus issues for years and Dr Joya [name] is aware. Please call  Ph# [phone]      Reason for Disposition  [1] Using nasal washes and pain medicine > 24 hours AND [2] sinus pain (around cheekbone or eye) persists  Answer Assessment - Initial Assessment Questions 1. LOCATION: "Where does it hurt?"      Head congestion and pressure 2. ONSET: "When did the sinus pain start?"  (e.g., hours, days)      Started last week- patient has been dealing with pain 3. SEVERITY: "How bad is the pain?"   (Scale 1-10; mild, moderate or severe)   - MILD (1-3): doesn't interfere with normal activities    - MODERATE (4-7): interferes with normal activities (e.g., work or school) or awakens from sleep   - SEVERE (8-10): excruciating pain and patient unable to do any normal activities        moderate 4. RECURRENT SYMPTOM: "Have you ever had sinus problems before?" If Yes, ask: "When was the last time?" and "What happened that time?"      Yes- frequent- nasal spray 5. NASAL CONGESTION: "Is the nose blocked?" If Yes, ask: "Can you open it or must you breathe through your mouth?"     Yes- feels like face is swollen 6. NASAL DISCHARGE: "Do you have discharge from your nose?" If so ask, "What color?"     green 7. FEVER: "Do you have a fever?" If Yes, ask: "What is it, how was it measured, and when did it start?"      no 8. OTHER SYMPTOMS: "Do you have any other symptoms?" (e.g., sore throat, cough, earache, difficulty breathing)     no 9. PREGNANCY: "Is there any chance you are pregnant?" "When was your last menstrual period?"     *No Answer*  Protocols used: Sinus Pain or Congestion-A-AH

## 2021-11-07 NOTE — Telephone Encounter (Signed)
Attempt to call patient to advise patient of next steps regarding an appt.   Please see if patient is willing to be scheduled at Primary Care at [facility] for an appt tomorrow or this week.   Otherwise a.m. virtual apt with provider at [facility].

## 2021-11-30 ENCOUNTER — Encounter (HOSPITAL_COMMUNITY): Payer: Self-pay | Admitting: Emergency Medicine

## 2021-11-30 ENCOUNTER — Other Ambulatory Visit: Payer: Self-pay

## 2021-11-30 ENCOUNTER — Emergency Department (HOSPITAL_COMMUNITY)
Admission: EM | Admit: 2021-11-30 | Discharge: 2021-12-01 | Discharge status: Against Medical Advice | Payer: 59 | Attending: Emergency Medicine | Admitting: Emergency Medicine

## 2021-11-30 ENCOUNTER — Telehealth: Payer: Self-pay | Admitting: *Deleted

## 2021-11-30 DIAGNOSIS — R748 Abnormal levels of other serum enzymes: Secondary | ICD-10-CM | POA: Diagnosis not present

## 2021-11-30 DIAGNOSIS — R079 Chest pain, unspecified: Secondary | ICD-10-CM | POA: Insufficient documentation

## 2021-11-30 DIAGNOSIS — R1084 Generalized abdominal pain: Secondary | ICD-10-CM | POA: Insufficient documentation

## 2021-11-30 DIAGNOSIS — Z5329 Procedure and treatment not carried out because of patient's decision for other reasons: Secondary | ICD-10-CM

## 2021-11-30 LAB — COMPREHENSIVE METABOLIC PANEL
ALT: 25 U/L (ref 0–44)
AST: 21 U/L (ref 15–41)
Albumin: 3.8 g/dL (ref 3.5–5.0)
Alkaline Phosphatase: 62 U/L (ref 38–126)
Anion gap: 5 (ref 5–15)
BUN: 10 mg/dL (ref 6–20)
CO2: 28 mmol/L (ref 22–32)
Calcium: 9.1 mg/dL (ref 8.9–10.3)
Chloride: 108 mmol/L (ref 98–111)
Creatinine, Ser: 0.87 mg/dL (ref 0.61–1.24)
GFR, Estimated: >60 mL/min (ref >60)
Glucose, Bld: 123 mg/dL — ABNORMAL HIGH (ref 70–99)
Potassium: 4.1 mmol/L (ref 3.5–5.1)
Sodium: 141 mmol/L (ref 135–145)
Total Bilirubin: 0.8 mg/dL (ref 0.3–1.2)
Total Protein: 6.9 g/dL (ref 6.5–8.1)

## 2021-11-30 LAB — CBC WITH DIFFERENTIAL/PLATELET
Abs Immature Granulocytes: 0.01 10*3/uL (ref 0.00–0.07)
Basophils Absolute: 0 10*3/uL (ref 0.0–0.1)
Basophils Relative: 1 %
Eosinophils Absolute: 0.1 10*3/uL (ref 0.0–0.5)
Eosinophils Relative: 2 %
HCT: 41 % (ref 39.0–52.0)
Hemoglobin: 13.8 g/dL (ref 13.0–17.0)
Immature Granulocytes: 0 %
Lymphocytes Relative: 60 %
Lymphs Abs: 2.6 10*3/uL (ref 0.7–4.0)
MCH: 30.6 pg (ref 26.0–34.0)
MCHC: 33.7 g/dL (ref 30.0–36.0)
MCV: 90.9 fL (ref 80.0–100.0)
Monocytes Absolute: 0.6 10*3/uL (ref 0.1–1.0)
Monocytes Relative: 15 %
Neutro Abs: 1 10*3/uL — ABNORMAL LOW (ref 1.7–7.7)
Neutrophils Relative %: 22 %
Platelets: 218 10*3/uL (ref 150–400)
RBC: 4.51 MIL/uL (ref 4.22–5.81)
RDW: 12.6 % (ref 11.5–15.5)
WBC: 4.3 10*3/uL (ref 4.0–10.5)
nRBC: 0 % (ref 0.0–0.2)

## 2021-11-30 LAB — LIPASE, BLOOD: Lipase: 56 U/L — ABNORMAL HIGH (ref 11–51)

## 2021-11-30 NOTE — ED Provider Triage Note (Cosign Needed)
Emergency Medicine Provider Triage Evaluation Note ? ?Zachary [name] , a 61 y.o. male  was evaluated in triage.  Pt complains of upper abdominal pain.  Patient has had this intermittently over the past couple of months and was seen in the emergency department early February with reassuring labs.  Discharged home on Bentyl which she took for several days but it did not help.  He has been taking Pepto-Bismol which does seem to help.  No associated vomiting.  Pain radiates to the chest, no radiation to the back.  No urinary symptoms.  No diarrhea or constipation. ? ?Review of Systems  ?Positive: Abdominal pain, chest pain ?Negative: Cough ? ?Physical Exam  ?BP 116/78   Pulse 75   Temp (!) 97.5 ?F (36.4 ?C) (Oral)   Resp 16   Ht '5\' 6"'$  (1.676 m)   Wt 75 kg   SpO2 96%   BMI 26.69 kg/m?  ?Gen:   Awake, no distress   ?Resp:  Normal effort  ?MSK:   Moves extremities without difficulty  ?Other:  Mild tenderness without rebound or guarding in the epigastrium ? ?Medical Decision Making  ?Medically screening exam initiated at 10:37 PM.  Appropriate orders placed.  Zachary [name] was informed that the remainder of the evaluation will be completed by another provider, this initial triage assessment does not replace that evaluation, and the importance of remaining in the ED until their evaluation is complete. ? ? ?  ?Carlisle Cater, PA-C ?11/30/21 2238 ? ?

## 2021-11-30 NOTE — Telephone Encounter (Signed)
This pt must come for OV next mon or Tuesday add him on ? ?I cannot suggest anything without a direct exam  also tell pt he will need an Abdominal CT, he had refused this in ED but this MUST occur ?

## 2021-11-30 NOTE — Telephone Encounter (Signed)
Called pt and he said the medication they gave him causes him to have a upset stomach and feel bad. Has been taking Pepto-Bismol which he said has been helping. ?

## 2021-11-30 NOTE — Telephone Encounter (Signed)
Copied from [place] [phone]. Topic: General - Inquiry ?>> Nov 28, 2021  9:04 AM Robina Ade, Helene Kelp D wrote: ?Reason for CRM: Patient called and would like to speak with Dr. Joya [name] or his CMA about medication that was given to him at the hospital last month. ?

## 2021-11-30 NOTE — ED Triage Notes (Signed)
Pt arrive POV from home with abd pain radiating to his chest that started today. Hx of ulcers ? ?

## 2021-12-01 ENCOUNTER — Emergency Department (HOSPITAL_COMMUNITY): Payer: 59

## 2021-12-01 ENCOUNTER — Other Ambulatory Visit: Payer: Self-pay | Admitting: Critical Care Medicine

## 2021-12-01 ENCOUNTER — Other Ambulatory Visit: Payer: Self-pay

## 2021-12-01 LAB — TROPONIN I (HIGH SENSITIVITY)
Troponin I (High Sensitivity): 3 ng/L (ref <18)
Troponin I (High Sensitivity): 4 ng/L (ref <18)

## 2021-12-01 MED ORDER — LORAZEPAM 2 MG/ML IJ SOLN
1.0000 mg | Freq: Once | INTRAMUSCULAR | Status: DC
  Filled 2021-12-01: qty 1

## 2021-12-01 MED ORDER — PANTOPRAZOLE SODIUM 40 MG PO TBEC
40.0000 mg | DELAYED_RELEASE_TABLET | Freq: Two times a day (BID) | ORAL | 3 refills | Status: DC
  Filled 2021-12-01: qty 60, 30d supply, fill #0

## 2021-12-01 MED ORDER — MORPHINE SULFATE (PF) 4 MG/ML IV SOLN
4.0000 mg | Freq: Once | INTRAVENOUS | Status: AC
  Administered 2021-12-01: 4 mg via INTRAVENOUS
  Filled 2021-12-01: qty 1

## 2021-12-01 MED ORDER — ONDANSETRON HCL 4 MG/2ML IJ SOLN
4.0000 mg | Freq: Once | INTRAMUSCULAR | Status: AC
  Administered 2021-12-01: 4 mg via INTRAVENOUS
  Filled 2021-12-01: qty 2

## 2021-12-01 NOTE — Telephone Encounter (Signed)
Pt was scheduled for Tues at 8:30 ?

## 2021-12-01 NOTE — Discharge Instructions (Addendum)
Today I recommended the CT scan.  I offered you a medication called Ativan to help with anxiety and claustrophobia which you refused to try and you refused the CT scan.  We discussed at length that with this I am unable to rule out possible life-threatening causes of your symptoms today.  You are at risk of death, disability that may be severe, worsening pain and worsening condition and others.  ? ?You stated your understanding of this risk and continued to refuse to try the anxiety medication or obtain the CT scan.   ? ?You made this decision knowing it was AGAINST MEDICAL ADVICE. ? ?Please do not hesitate to return to the emergency room if you change your mind or your symptoms change.   ? ?Today you received medications that may make you sleepy or impair your ability to make decisions.  For the next 24 hours please do not drive, operate heavy machinery, care for a small child with out another adult present, or perform any activities that may cause harm to you or someone else if you were to fall asleep or be impaired.  ? ? ?

## 2021-12-01 NOTE — ED Provider Notes (Signed)
?[facility] ?Provider Note ? ? ?CSN: [id number] ?Arrival date & time: 11/30/21  2225 ? ?  ? ?History ? ?Chief Complaint  ?Patient presents with  ? Abdominal Pain  ? ? ?Zachary [name] is a 61 y.o. male who presents today for evaluation of abdominal pain radiating into his chest.  He states that he has been having ongoing issues with abdominal pain however it flared today.  He was seen previously in the emergency room and refused CT scan at that time.  He states he has been taking Pepto-Bismol with mild relief.  He states he is having a bowel movement every day that is normal.  The pain into his chest is new today.  No fevers.  He is nauseated today.  He denies any vomiting. ? ?He was given Bentyl at previous ED visit which he feels is making his symptoms worse. ? ?HPI ? ?  ? ?Home Medications ?Prior to Admission medications   ?Medication Sig Start Date End Date Taking? Authorizing Provider  ?albuterol (VENTOLIN HFA) 108 (90 Base) MCG/ACT inhaler Inhale 2 puffs into the lungs every 6 (six) hours as needed for wheezing or shortness of breath (cough). 09/15/20   Elsie Stain, MD  ?cetirizine (ZYRTEC) 10 MG tablet TAKE 1 TABLET (10 MG TOTAL) BY MOUTH DAILY. 11/15/20 11/15/21  Elsie Stain, MD  ?dicyclomine (BENTYL) 20 MG tablet Take 1 tablet (20 mg total) by mouth 2 (two) times daily. 10/14/21   Noemi Chapel, MD  ?doxycycline (VIBRA-TABS) 100 MG tablet Take 1 tablet (100 mg total) by mouth 2 (two) times daily. 10/11/21   Elsie Stain, MD  ?fluticasone (FLONASE) 50 MCG/ACT nasal spray PLACE 2 SPRAYS INTO BOTH NOSTRILS DAILY. 03/17/21 03/17/22  Elsie Stain, MD  ?pantoprazole (PROTONIX) 20 MG tablet Take 1 tablet (20 mg total) by mouth daily. 10/01/21   Prosperi, Christian H, PA-C  ?predniSONE (DELTASONE) 10 MG tablet Take 4 tablets daily for 5 days then stop 10/11/21   Elsie Stain, MD  ?promethazine-dextromethorphan (PROMETHAZINE-DM) 6.25-15 MG/5ML syrup Take 5 mLs by  mouth 4 (four) times daily as needed for cough. NEEDS PASS 09/23/21   Gildardo Pounds, NP  ?sucralfate (CARAFATE) 1 g tablet Take 1 tablet (1 g total) by mouth 4 (four) times daily -  with meals and at bedtime. 10/01/21   Prosperi, Darrick Meigs H, PA-C  ?   ? ?Allergies    ?Patient has no known allergies.   ? ?Review of Systems   ?Review of Systems ? ?Physical Exam ?Updated Vital Signs ?BP 132/71 (BP Location: Right Arm)   Pulse 69   Temp (!) 97.5 ?F (36.4 ?C) (Oral)   Resp 18   Ht '5\' 6"'$  (1.676 m)   Wt 75 kg   SpO2 100%   BMI 26.69 kg/m?  ?Physical Exam ?Vitals and nursing note reviewed.  ?Constitutional:   ?   General: He is not in acute distress. ?   Appearance: He is not diaphoretic.  ?HENT:  ?   Head: Normocephalic and atraumatic.  ?Eyes:  ?   General: No scleral icterus.    ?   Right eye: No discharge.     ?   Left eye: No discharge.  ?   Conjunctiva/sclera: Conjunctivae normal.  ?Cardiovascular:  ?   Rate and Rhythm: Normal rate and regular rhythm.  ?   Heart sounds: Normal heart sounds.  ?Pulmonary:  ?   Effort: Pulmonary effort is normal. No respiratory distress.  ?  Breath sounds: No stridor.  ?Abdominal:  ?   General: Bowel sounds are increased. There is no distension.  ?   Palpations: Abdomen is soft.  ?   Tenderness: There is abdominal tenderness. There is guarding and rebound.  ?Musculoskeletal:     ?   General: No deformity.  ?   Cervical back: Normal range of motion.  ?Skin: ?   General: Skin is warm and dry.  ?Neurological:  ?   General: No focal deficit present.  ?   Mental Status: He is alert.  ?   Motor: No abnormal muscle tone.  ?Psychiatric:     ?   Behavior: Behavior normal.  ? ? ?ED Results / Procedures / Treatments   ?Labs ?(all labs ordered are listed, but only abnormal results are displayed) ?Labs Reviewed  ?COMPREHENSIVE METABOLIC PANEL - Abnormal; Notable for the following components:  ?    Result Value  ? Glucose, Bld 123 (*)   ? All other components within normal limits  ?CBC WITH  DIFFERENTIAL/PLATELET - Abnormal; Notable for the following components:  ? Neutro Abs 1.0 (*)   ? All other components within normal limits  ?LIPASE, BLOOD - Abnormal; Notable for the following components:  ? Lipase 56 (*)   ? All other components within normal limits  ?URINALYSIS, ROUTINE W REFLEX MICROSCOPIC  ?TROPONIN I (HIGH SENSITIVITY)  ?TROPONIN I (HIGH SENSITIVITY)  ? ? ?EKG ?None ? ?Radiology ?DG Chest 2 View ? ?Result Date: 12/01/2021 ?CLINICAL DATA:  Chest pain EXAM: CHEST - 2 VIEW COMPARISON:  09/30/2021 FINDINGS: The heart size and mediastinal contours are within normal limits. Both lungs are clear. The visualized skeletal structures are unremarkable. IMPRESSION: No active cardiopulmonary disease. Electronically Signed   By: Ulyses Jarred M.D.   On: 12/01/2021 02:22  ? ?US Abdomen Complete ? ?Result Date: 12/01/2021 ?CLINICAL DATA:  61 year old male with history of abdominal pain. EXAM: ABDOMEN ULTRASOUND COMPLETE COMPARISON:  No priors. FINDINGS: Gallbladder: No gallstones or wall thickening visualized. No sonographic Murphy sign noted by sonographer. Common bile duct: Diameter: 3.3 mm Liver: No focal lesion identified. Within normal limits in parenchymal echogenicity. Portal vein is patent on color Doppler imaging with normal direction of blood flow towards the liver. IVC: No abnormality visualized. Pancreas: Visualized portion unremarkable. Spleen: Size and appearance within normal limits. Right Kidney: Length: 10.5 cm. Echogenicity within normal limits. No mass or hydronephrosis visualized. Left Kidney: Length: 9.8 cm. Echogenicity within normal limits. No mass or hydronephrosis visualized. Abdominal aorta: No aneurysm visualized. Other findings: None. IMPRESSION: 1. No acute abnormality to account for the patient's symptoms. Normal abdominal ultrasound. Electronically Signed   By: Vinnie Langton M.D.   On: 12/01/2021 05:17   ? ?Procedures ?Procedures  ? ? ?Medications Ordered in ED ?Medications   ?morphine (PF) 4 MG/ML injection 4 mg (4 mg Intravenous Given 12/01/21 0159)  ?ondansetron [facility]) injection 4 mg (4 mg Intravenous Given 12/01/21 0159)  ? ? ?ED Course/ Medical Decision Making/ A&P ?Clinical Course as of 12/01/21 0648  ?Thu Dec 01, 2021  ?0230 I was informed by tech that patient refused to go in scanner ? [EH]  ?0421 Ativan had been ordered for anxiety and claustrophobia for CT scan, however patient refused this.  I spoke with patient, he refuses to try the Ativan saying that he knows it will not work and that he needs to be " knocked out" for a CAT scan on his abdomen. ?I explained to him that general anesthesia for a  CT scan on his abdomen is not currently possible or appropriate.  He refuses to even try the Ativan to see how that makes him feel.  He will allow me to obtain a ultrasound on his abdomen. ?We had a extensive discussion that even with a ultrasound on his abdomen I am unable to rule out life threatening causes including those that may cause pain, worsening of condition, disability that may be severe and other complications and he states his understanding. [EH]  ?0531 Patient continues to refuse CT scan or to attempt to try the ativan.  I asked him if there was anything I could do to allow him to obtain the CT scan today after we discussed the purpose of the CT scan and he stated that unless I was able to fully knock him out that there was nothing I can do.  He insists that he will be back later today for the scan.  When I asked him what will change between now and then he tells me "I just need time to think about it." [EH]  ?  ?Clinical Course User Index ?[EH] Lorin Glass, PA-C  ? ?                        ?Medical Decision Making ?Patient is a 61 year old man who presents today for evaluation of abdominal pain.  On my exam his abdomen is generally tender. ? ?Labs are obtained and reviewed, please see below. ?I recommended a CT abdomen pelvis with contrast.  Patient refused  this. ?I gave him pain meds, nausea meds.  I recommended a dose of Ativan as he reports he is claustrophobic.  He did attempt to go to CT scan once however once the bed began moving he refused to continue. ?[place]

## 2021-12-01 NOTE — Progress Notes (Signed)
I spoke with the patient he left the emergency room after being going there last night [place] he had a series of tests done he has elevated lipase he has a low white count and he is not drinking alcohol nevertheless he may have low-grade pancreatitis.  The abdominal ultrasound did not fully visualize the pancreas.  He would benefit from gastroenterology referral and upper endoscopy. ? ?I wikk refill  his Protonix and told him to stop the Bentyl for now ? ?My plan is to get him into the office next week for direct exam and then refer to gastroenterology he understands these recommendations if he worsens he is to go back to the emergency room ? ?Carly work this pt in Monday or Tuesday next week ?

## 2021-12-02 ENCOUNTER — Other Ambulatory Visit: Payer: Self-pay | Admitting: Pharmacist

## 2021-12-02 ENCOUNTER — Other Ambulatory Visit: Payer: Self-pay

## 2021-12-02 MED ORDER — PANTOPRAZOLE SODIUM 40 MG PO TBEC
40.0000 mg | DELAYED_RELEASE_TABLET | Freq: Every day | ORAL | 3 refills | Status: DC
  Filled 2021-12-02: qty 30, 30d supply, fill #0

## 2021-12-06 ENCOUNTER — Ambulatory Visit: Payer: 59 | Attending: Critical Care Medicine | Admitting: Critical Care Medicine

## 2021-12-06 ENCOUNTER — Encounter: Payer: Self-pay | Admitting: Critical Care Medicine

## 2021-12-06 ENCOUNTER — Other Ambulatory Visit: Payer: Self-pay

## 2021-12-06 VITALS — BP 138/90 | HR 62 | Wt 147.0 lb

## 2021-12-06 DIAGNOSIS — R1013 Epigastric pain: Secondary | ICD-10-CM | POA: Insufficient documentation

## 2021-12-06 DIAGNOSIS — R748 Abnormal levels of other serum enzymes: Secondary | ICD-10-CM | POA: Diagnosis not present

## 2021-12-06 DIAGNOSIS — K219 Gastro-esophageal reflux disease without esophagitis: Secondary | ICD-10-CM | POA: Diagnosis not present

## 2021-12-06 NOTE — Patient Instructions (Signed)
Increase pantoprazole to twice daily take 1/2-hour before eating ? ?I have sent an urgent referral to gastroenterology they will call you were trying to get you into soon as possible you will likely need an upper endoscopy ? ?Follow reflux diet as attached ? ?No other change in medications ? ?Return to see Dr. Joya [name] 3 months for primary care ? ? ?

## 2021-12-06 NOTE — Progress Notes (Signed)
? ?Established Patient Office Visit ? ?Subjective:  ?Patient ID: Zachary [name], male    DOB: [birth year]  Age: 61 y.o. MRN: [id number] ? ?CC:  ?Chief Complaint  ?Patient presents with  ? Hospitalization Follow-up  ? ? ?HPI ?10/11/21 ?Zachary [name] presents for follow up after ED visit for cough. This problem has persisted for a long time, but has been worse over the past two weeks. He last saw Dr. Joya [name] in this office on 08/23/2021 and was diagnosed with acute recurrent maxillary sinusitis which was treated with a 5 day course of azithromycin. This helped with his symptoms for awhile but the problem returned.  ? ?He saw [name] for this issue on 09/23/2021 via telemedicine visit. At that time, he was diagnosed with acute bronchitis and prescribed promethazine cough syrup. He then presented to the ED on 09/30/21 for productive cough, bloating, abdominal fullness, and reflux symptoms. ED workup revealed no additional findings. At that time, supportive care was recommended for the cough, and treatment of GERD symptoms with Protonix and Carafate.  ? ?He reports that today he is still experiencing productive cough, shortness of breath, chest tightness, and some wheezing. He still has congestion, sinus pain, rhinorrhea, and sinus pressure. His chest hurts from the amount of coughing he has been doing. He is unaware of fevers, but he has been waking up with sweating at night occasionally for the past few days. He also has headaches throughout the day. He has been feeling a lot of nausea that he thinks is related to the cough. He has used cough syrup as prescribed as well as Mucinex, Flonase, and cetirizine daily. He thinks that these help, but only for a short amount of time. This issue has left him feeling very fatigued, but otherwise his mood is fine. ? ?12/06/21 ?Patient returns after having been in the emergency room recently for abdominal pain below is documentation from the ER visit ?In ED for  Abdominal pain  ? I was informed by tech that patient refused to go in scanner ? [EH]  ?0421 Ativan had been ordered for anxiety and claustrophobia for CT scan, however patient refused this.  I spoke with patient, he refuses to try the Ativan saying that he knows it will not work and that he needs to be " knocked out" for a CAT scan on his abdomen. ?I explained to him that general anesthesia for a CT scan on his abdomen is not currently possible or appropriate.  He refuses to even try the Ativan to see how that makes him feel.  He will allow me to obtain a ultrasound on his abdomen. ?We had a extensive discussion that even with a ultrasound on his abdomen I am unable to rule out life threatening causes including those that may cause pain, worsening of condition, disability that may be severe and other complications and he states his understanding. [EH]  ?0531 Patient continues to refuse CT scan or to attempt to try the ativan.  I asked him if there was anything I could do to allow him to obtain the CT scan today after we discussed the purpose of the CT scan and he stated that unless I was able to fully knock him out that there was nothing I can do.  He insists that he will be back later today for the scan.  When I asked him what will change between now and then he tells me "I just need time to think about it." [EH]  ?   ?  Clinical Course User Index ?[EH] Lorin Glass, PA-C  ?  ?                        ?Medical Decision Making ?Patient is a 61 year old man who presents today for evaluation of abdominal pain.  On my exam his abdomen is generally tender. ?  ?Labs are obtained and reviewed, please see below. ?I recommended a CT abdomen pelvis with contrast.  Patient refused this. ?I gave him pain meds, nausea meds.  I recommended a dose of Ativan as he reports he is claustrophobic.  He did attempt to go to CT scan once however once the bed began moving he refused to continue. ?Patient refused to try Ativan.  He  told me multiple times that it would not work despite never having it before. ?Please see ED course for further details. ?Ultimately patient continued to refuse to try anxiolytics or CT scan. ?We discussed risks of this and that he is making this decision [place].  We discussed that I am unable to rule out life-threatening or other serious potential causes of his symptoms and abdominal pain.  He states his understanding of this.  There is no family at bedside for me to try to have convince patient to stay. ?  ?He states that there is nothing I can do to allow him to have the CT scan short of general anesthetic which I informed him is not something I am able to provide and do not think it is appropriate especially as he will not even try anxiolytics. ?  ?I have attempted to treat and evaluate patient with in the limits of what he will allow.  ?  ?Patient will sign out AMA.  ?  ?Amount and/or Complexity of Data Reviewed ?Labs: ordered. ?   Details: CBC and CMP are unremarkable.  Lipase is minimally elevated at 56.  Troponin x2 is not elevated. ?Radiology: ordered. ?   Details: I ordered a CT abdomen pelvis with contrast that patient refused, please see ED course.  Chest x-ray without consolidation pneumothorax or other abnormalities.  Abdominal ultrasound obtained without cause for his symptoms found. ?ECG/medicine tests: ordered. ?  ?Risk ?Prescription drug management. ?Parenteral controlled substances. ?Diagnosis or treatment significantly limited by social determinants of health. ?Risk Details: Patient refused imaging, left AMA.  ?  ?Note the patient left AGAINST MEDICAL ADVICE his labs showed elevated lipase and ultrasound of the abdomen did not show specific cause of his symptoms.  He had significant epigastric abdominal pain with nausea and occasional emesis.  He would regurgitate some bile colored material.  He does not drink alcohol is not currently smoking cigarettes.  The abdominal pain has  subsided somewhat with the use of pantoprazole.  Unfortunately his insurance would only pay for once daily pantoprazole.  He denies constipation or diarrhea.  There is no blood in the stool no blood in the emesis. ? ?Patient comes to the office today after having been on the pantoprazole for further follow-up and evaluations.  Note his prior visit in January revolved around acute maxillary sinusitis recurrent which now has resolved. ?Past Medical History:  ?Diagnosis Date  ? Asthma   ? COVID-19 virus infection 09/15/2020  ? Knee pain   ? ? ?Past Surgical History:  ?Procedure Laterality Date  ? KNEE ARTHROSCOPY    ? x 2  ? ? ?Family History  ?Problem Relation Age of Onset  ? Hypertension Mother   ? Cancer  Father   ? Cancer Other   ? ? ?Social History  ? ?Socioeconomic History  ? Marital status: Single  ?  Spouse name: Not on file  ? Number of children: Not on file  ? Years of education: Not on file  ? Highest education level: Not on file  ?Occupational History  ? Not on file  ?Tobacco Use  ? Smoking status: Former  ? Smokeless tobacco: Never  ?Vaping Use  ? Vaping Use: Never used  ?Substance and Sexual Activity  ? Alcohol use: No  ? Drug use: No  ? Sexual activity: Yes  ?Other Topics Concern  ? Not on file  ?Social History Narrative  ? Not on file  ? ?Social Determinants of Health  ? ?Financial Resource Strain: Not on file  ?Food Insecurity: Not on file  ?Transportation Needs: Not on file  ?Physical Activity: Not on file  ?Stress: Not on file  ?Social Connections: Not on file  ?Intimate Partner Violence: Not on file  ? ? ?Outpatient Medications Prior to Visit  ?Medication Sig Dispense Refill  ? albuterol (VENTOLIN HFA) 108 (90 Base) MCG/ACT inhaler Inhale 2 puffs into the lungs every 6 (six) hours as needed for wheezing or shortness of breath (cough). 18 g 0  ? fluticasone (FLONASE) 50 MCG/ACT nasal spray PLACE 2 SPRAYS INTO BOTH NOSTRILS DAILY. 16 g 6  ? pantoprazole (PROTONIX) 40 MG tablet Take 1 tablet (40 mg total)  by mouth daily. 30 tablet 3  ? cetirizine (ZYRTEC) 10 MG tablet TAKE 1 TABLET (10 MG TOTAL) BY MOUTH DAILY. 30 tablet 11  ? ?No facility-administered medications prior to visit.  ? ? ?No Known Allergies ? ?ROS

## 2021-12-06 NOTE — Assessment & Plan Note (Signed)
As per abdominal pain assessment ?

## 2021-12-06 NOTE — Assessment & Plan Note (Signed)
Acute epigastric tenderness with elevated lipase to a mild degree ? ?Ultrasound of the abdomen was not revealing of etiology pancreas was not well seen ? ?I made an urgent referral to gastroenterology he does have health insurance confirmed that his appointment will be tomorrow on 29 March ? ?He is to stay on pantoprazole to that visit and to stay off work ?

## 2021-12-07 ENCOUNTER — Ambulatory Visit: Payer: 59 | Admitting: Internal Medicine

## 2021-12-07 ENCOUNTER — Encounter: Payer: Self-pay | Admitting: Internal Medicine

## 2021-12-12 ENCOUNTER — Other Ambulatory Visit: Payer: Self-pay

## 2021-12-12 ENCOUNTER — Encounter (HOSPITAL_COMMUNITY): Payer: Self-pay

## 2021-12-12 ENCOUNTER — Emergency Department (HOSPITAL_COMMUNITY)
Admission: EM | Admit: 2021-12-12 | Discharge: 2021-12-12 | Disposition: A | Discharge status: Home / Self Care | Payer: 59 | Attending: Emergency Medicine | Admitting: Emergency Medicine

## 2021-12-12 DIAGNOSIS — N481 Balanitis: Secondary | ICD-10-CM | POA: Diagnosis not present

## 2021-12-12 DIAGNOSIS — R21 Rash and other nonspecific skin eruption: Secondary | ICD-10-CM | POA: Diagnosis present

## 2021-12-12 MED ORDER — CLOTRIMAZOLE 1 % EX CREA: TOPICAL_CREAM | CUTANEOUS | 0 refills | Status: DC

## 2021-12-12 NOTE — ED Provider Notes (Signed)
?[facility] ?Provider Note ? ? ?CSN: [id number] ?Arrival date & time: 12/12/21  2214 ? ?  ? ?History ? ?Chief Complaint  ?Patient presents with  ? Rash  ? ? ?Zachary [name] is a 61 y.o. male. ? ?61 yo M with a chief complaints of a rash to his penis.  He noticed this going on a couple days ago.  Has had a rash like this similar.  Has little bit of itching to the area.  Denies any discharge denies any concerning sexual encounters.  The patient tells me that he had a rash like this similarly a couple years ago and was seen in the emergency department and was given a cream that seem to make it better. ? ? ?Rash ? ?  ? ?Home Medications ?Prior to Admission medications   ?Medication Sig Start Date End Date Taking? Authorizing Provider  ?clotrimazole (LOTRIMIN) 1 % cream Apply to affected area 2 times daily 12/12/21  Yes Deno Etienne, DO  ?albuterol (VENTOLIN HFA) 108 (90 Base) MCG/ACT inhaler Inhale 2 puffs into the lungs every 6 (six) hours as needed for wheezing or shortness of breath (cough). 09/15/20   Elsie Stain, MD  ?cetirizine (ZYRTEC) 10 MG tablet TAKE 1 TABLET (10 MG TOTAL) BY MOUTH DAILY. 11/15/20 11/15/21  Elsie Stain, MD  ?fluticasone (FLONASE) 50 MCG/ACT nasal spray PLACE 2 SPRAYS INTO BOTH NOSTRILS DAILY. 03/17/21 03/17/22  Elsie Stain, MD  ?pantoprazole (PROTONIX) 40 MG tablet Take 1 tablet (40 mg total) by mouth daily. 12/02/21   Elsie Stain, MD  ?   ? ?Allergies    ?Patient has no known allergies.   ? ?Review of Systems   ?Review of Systems  ?Skin:  Positive for rash.  ? ?Physical Exam ?Updated Vital Signs ?BP 126/86   Pulse 69   Temp 98.1 ?F (36.7 ?C) (Oral)   Resp 17   Ht '5\' 6"'$  (1.676 m)   Wt 68 kg   SpO2 96%   BMI 24.21 kg/m?  ?Physical Exam ?Vitals and nursing note reviewed.  ?Constitutional:   ?   Appearance: He is well-developed.  ?HENT:  ?   Head: Normocephalic and atraumatic.  ?Eyes:  ?   Pupils: Pupils are equal, round, and reactive to light.   ?Neck:  ?   Vascular: No JVD.  ?Cardiovascular:  ?   Rate and Rhythm: Normal rate and regular rhythm.  ?   Heart sounds: No murmur heard. ?  No friction rub. No gallop.  ?Pulmonary:  ?   Effort: No respiratory distress.  ?   Breath sounds: No wheezing.  ?Abdominal:  ?   General: There is no distension.  ?   Tenderness: There is no abdominal tenderness. There is no guarding or rebound.  ?Genitourinary: ?   Comments: Just proximal to the head of the penis the patient has an area of raised erythema along the shaft.  No obvious satellite lesions.  No discharge.  No lymphadenopathy.  Circumcised. ?Musculoskeletal:     ?   General: Normal range of motion.  ?   Cervical back: Normal range of motion and neck supple.  ?Skin: ?   Coloration: Skin is not pale.  ?   Findings: No rash.  ?Neurological:  ?   Mental Status: He is alert and oriented to person, place, and time.  ?Psychiatric:     ?   Behavior: Behavior normal.  ? ? ?ED Results / Procedures / Treatments   ?  Labs ?(all labs ordered are listed, but only abnormal results are displayed) ?Labs Reviewed - No data to display ? ?EKG ?None ? ?Radiology ?No results found. ? ?Procedures ?Procedures  ? ? ?Medications Ordered in ED ?Medications - No data to display ? ?ED Course/ Medical Decision Making/ A&P ?  ?                        ?Medical Decision Making ? ?61 yo M with a chief complaint of inflammation to his penis.  Has been going on for a couple days.  Exam seems consistent with balanitis.  Patient was actually seen in the ED a couple years ago for this.  I was able to review that visit.  Was started on clotrimazole cream.  We will do the same visit improved symptoms last time.  PCP follow-up. ? ?11:19 PM:  I have discussed the diagnosis/risks/treatment options with the patient.  Evaluation and diagnostic testing in the emergency department does not suggest an emergent condition requiring admission or immediate intervention beyond what has been performed at this time.   They will follow up with PCP. We also discussed returning to the ED immediately if new or worsening sx occur. We discussed the sx which are most concerning (e.g., sudden worsening pain, fever, inability to tolerate by mouth) that necessitate immediate return. Medications administered to the patient during their visit and any new prescriptions provided to the patient are listed below. ? ?Medications given during this visit ?Medications - No data to display ? ? ?The patient appears reasonably screen and/or stabilized for discharge and I doubt any other medical condition or other [facility] requiring further screening, evaluation, or treatment in the ED at this time prior to discharge.  ? ? ? ? ? ? ? ? ?Final Clinical Impression(s) / ED Diagnoses ?Final diagnoses:  ?Balanitis  ? ? ?Rx / DC Orders ?ED Discharge Orders   ? ?      Ordered  ?  clotrimazole (LOTRIMIN) 1 % cream       ? 12/12/21 2316  ? ?  ?  ? ?  ? ? ?  ?Deno Etienne, DO ?12/12/21 2319 ? ?

## 2021-12-12 NOTE — Discharge Instructions (Signed)
Follow up with your doctor in the office.  Use the cream as prescribed.  ?

## 2021-12-12 NOTE — ED Triage Notes (Signed)
Pt reports with a rash to the right side of his groin x 2 days. Pt states that this has happened before and he was given a fungal cream.   ?

## 2021-12-16 ENCOUNTER — Encounter (HOSPITAL_COMMUNITY): Payer: Self-pay

## 2021-12-16 ENCOUNTER — Ambulatory Visit (HOSPITAL_COMMUNITY)
Admission: EM | Admit: 2021-12-16 | Discharge: 2021-12-16 | Disposition: A | Discharge status: Home / Self Care | Payer: 59 | Attending: Physician Assistant | Admitting: Physician Assistant

## 2021-12-16 DIAGNOSIS — N489 Disorder of penis, unspecified: Secondary | ICD-10-CM

## 2021-12-16 LAB — HEPATITIS C ANTIBODY: HCV Ab: NONREACTIVE

## 2021-12-16 LAB — HIV ANTIBODY (ROUTINE TESTING W REFLEX): HIV Screen 4th Generation wRfx: NONREACTIVE

## 2021-12-16 NOTE — ED Provider Notes (Signed)
?[name] ? ? ? ?CSN: [id number] ?Arrival date & time: 12/16/21  1302 ? ? ?  ? ?History   ?Chief Complaint ?Chief Complaint  ?Patient presents with  ? Rash  ? ? ?HPI ?Zachary [name] is a 61 y.o. male.  ? ?Patient presents today with a weeklong history of rash and ulcerated lesion on his penis.  He was seen in the emergency room on 12/12/2021 at which point he was diagnosed with balanitis and started on clotrimazole.  He has been using this twice daily without improvement of symptoms.  He denies any changes to personal hygiene products including soaps or detergents.  Denies any significant pain but does have some irritation particularly if his underwear rubs on the lesion.  He denies any penile discharge, dysuria, fever, nausea, vomiting.  Denies episodes of similar symptoms in the past or history of HSV.  He reports having complete STI panel within the past year that was all negative but is agreeable to repeat testing today. ? ? ?Past Medical History:  ?Diagnosis Date  ? Asthma   ? COVID-19 virus infection 09/15/2020  ? Knee pain   ? ? ?Patient Active Problem List  ? Diagnosis Date Noted  ? Abdominal pain, acute, epigastric 12/06/2021  ? Elevated lipase 12/06/2021  ? Acute recurrent maxillary sinusitis 03/17/2021  ? Allergic rhinitis 11/15/2020  ? Dermatitis 07/05/2020  ? Inguinal hernia of right side without obstruction or gangrene 03/17/2020  ? Periodontal disease 03/17/2020  ? Dental caries 03/17/2020  ? Mild intermittent asthma without complication 37/85/8850  ? Gastroesophageal reflux disease without esophagitis 03/17/2020  ? Chronic pain of right knee 03/17/2020  ? ? ?Past Surgical History:  ?Procedure Laterality Date  ? KNEE ARTHROSCOPY    ? x 2  ? ? ? ? ? ?Home Medications   ? ?Prior to Admission medications   ?Medication Sig Start Date End Date Taking? Authorizing Provider  ?albuterol (VENTOLIN HFA) 108 (90 Base) MCG/ACT inhaler Inhale 2 puffs into the lungs every 6 (six) hours as needed for  wheezing or shortness of breath (cough). 09/15/20   Elsie Stain, MD  ?cetirizine (ZYRTEC) 10 MG tablet TAKE 1 TABLET (10 MG TOTAL) BY MOUTH DAILY. 11/15/20 11/15/21  Elsie Stain, MD  ?clotrimazole (LOTRIMIN) 1 % cream Apply to affected area 2 times daily 12/12/21   Deno Etienne, DO  ?fluticasone (FLONASE) 50 MCG/ACT nasal spray PLACE 2 SPRAYS INTO BOTH NOSTRILS DAILY. 03/17/21 03/17/22  Elsie Stain, MD  ?pantoprazole (PROTONIX) 40 MG tablet Take 1 tablet (40 mg total) by mouth daily. 12/02/21   Elsie Stain, MD  ? ? ?Family History ?Family History  ?Problem Relation Age of Onset  ? Hypertension Mother   ? Cancer Father   ? Cancer Other   ? ? ?Social History ?Social History  ? ?Tobacco Use  ? Smoking status: Former  ? Smokeless tobacco: Never  ?Vaping Use  ? Vaping Use: Never used  ?Substance Use Topics  ? Alcohol use: No  ? Drug use: No  ? ? ? ?Allergies   ?Patient has no known allergies. ? ? ?Review of Systems ?Review of Systems  ?Constitutional:  Negative for activity change, appetite change, fatigue and fever.  ?Respiratory:  Negative for cough and shortness of breath.   ?Cardiovascular:  Negative for chest pain.  ?Gastrointestinal:  Negative for abdominal pain, diarrhea, nausea and vomiting.  ?Genitourinary:  Positive for genital sores. Negative for dysuria, frequency, penile discharge, penile pain, penile swelling, scrotal swelling  and testicular pain.  ? ? ?Physical Exam ?Triage Vital Signs ?ED Triage Vitals [12/16/21 1340]  ?Enc Vitals Group  ?   BP (!) 150/104  ?   Pulse Rate 67  ?   Resp 16  ?   Temp 98.5 ?F (36.9 ?C)  ?   Temp Source Oral  ?   SpO2 97 %  ?   Weight   ?   Height   ?   Head Circumference   ?   Peak Flow   ?   Pain Score 0  ?   Pain Loc   ?   Pain Edu?   ?   Excl. in [place]?   ? ?No data found. ? ?Updated Vital Signs ?BP (!) 150/104 (BP Location: Right Arm)   Pulse 67   Temp 98.5 ?F (36.9 ?C) (Oral)   Resp 16   SpO2 97%  ? ?Visual Acuity ?Right Eye Distance:   ?Left Eye Distance:    ?Bilateral Distance:   ? ?Right Eye Near:   ?Left Eye Near:    ?Bilateral Near:    ? ?Physical Exam ?Vitals reviewed.  ?Constitutional:   ?   General: He is awake.  ?   Appearance: Normal appearance. He is well-developed. He is not ill-appearing.  ?   Comments: Very pleasant male appears stated age in no acute distress sitting comfortably in exam room  ?HENT:  ?   Head: Normocephalic and atraumatic.  ?   Mouth/Throat:  ?   Pharynx: No oropharyngeal exudate, posterior oropharyngeal erythema or uvula swelling.  ?Cardiovascular:  ?   Rate and Rhythm: Normal rate and regular rhythm.  ?   Heart sounds: Normal heart sounds, S1 normal and S2 normal. No murmur heard. ?Pulmonary:  ?   Effort: Pulmonary effort is normal.  ?   Breath sounds: Normal breath sounds. No stridor. No wheezing, rhonchi or rales.  ?   Comments: Clear to auscultation bilaterally ?Abdominal:  ?   General: Bowel sounds are normal.  ?   Palpations: Abdomen is soft.  ?   Tenderness: There is no abdominal tenderness.  ?Genitourinary: ?   Penis: Lesions present.   ?   Comments: Ulcerated lesion noted at right side base of glans.  No additional vesicles or lesions noted.  No penile discharge. ? ?Adriana, CMA present as chaperone during exam. ?Neurological:  ?   Mental Status: He is alert.  ?Psychiatric:     ?   Behavior: Behavior is cooperative.  ? ? ? ?UC Treatments / Results  ?Labs ?(all labs ordered are listed, but only abnormal results are displayed) ?Labs Reviewed  ?HSV CULTURE AND TYPING  ?HIV ANTIBODY (ROUTINE TESTING W REFLEX)  ?HEPATITIS C ANTIBODY  ?RPR  ?CYTOLOGY, (ORAL, ANAL, URETHRAL) ANCILLARY ONLY  ? ? ?EKG ? ? ?Radiology ?No results found. ? ?Procedures ?Procedures (including critical care time) ? ?Medications Ordered in UC ?Medications - No data to display ? ?Initial Impression / Assessment and Plan / UC Course  ?I have reviewed the triage vital signs and the nursing notes. ? ?Pertinent labs & imaging results that were available during my  care of the patient were reviewed by me and considered in my medical decision making (see chart for details). ? ?  ? ?Discussed differential diagnosis for ulcerated lesion with patient including syphilis and herpes.  STI panel obtained today-results pending.  Patient was instructed to abstain from sexual activity until results are available.  He is to keep area clean to  prevent secondary infection.  We will make additional recommendations for treatment based on laboratory results.  Discussed that if he develops any additional symptoms he needs to return for reevaluation.  Strict return precautions given to which she expressed understanding. ? ?Final Clinical Impressions(s) / UC Diagnoses  ? ?Final diagnoses:  ?Penile lesion  ? ? ? ?Discharge Instructions   ? ?  ?I will contact you if any of your lab work is abnormal and we need to arrange any treatment.  Use hypoallergenic soaps and detergents.  You can continue clotrimazole.  Keep area clean to prevent infection.  If symptoms do not improve or if at any point anything worsens please return for reevaluation.  You should avoid sexual activity until results are available. ? ? ? ? ?ED Prescriptions   ?None ?  ? ?PDMP not reviewed this encounter. ?  ?Terrilee Croak, PA-C ?12/16/21 1424 ? ?

## 2021-12-16 NOTE — Discharge Instructions (Signed)
I will contact you if any of your lab work is abnormal and we need to arrange any treatment.  Use hypoallergenic soaps and detergents.  You can continue clotrimazole.  Keep area clean to prevent infection.  If symptoms do not improve or if at any point anything worsens please return for reevaluation.  You should avoid sexual activity until results are available. ?

## 2021-12-16 NOTE — ED Triage Notes (Signed)
Pt presents with concerns of a penile rash. Pt states he believes it is from his underwear rubbing against the skin.  ?

## 2021-12-17 LAB — RPR: RPR Ser Ql: NONREACTIVE

## 2021-12-18 LAB — HSV CULTURE AND TYPING

## 2021-12-19 ENCOUNTER — Telehealth (HOSPITAL_COMMUNITY): Payer: Self-pay | Admitting: Emergency Medicine

## 2021-12-19 ENCOUNTER — Emergency Department (HOSPITAL_COMMUNITY): Payer: 59

## 2021-12-19 ENCOUNTER — Emergency Department (HOSPITAL_COMMUNITY)
Admission: EM | Admit: 2021-12-19 | Discharge: 2021-12-19 | Disposition: A | Discharge status: Home / Self Care | Payer: 59 | Attending: Emergency Medicine | Admitting: Emergency Medicine

## 2021-12-19 ENCOUNTER — Encounter (HOSPITAL_COMMUNITY): Payer: Self-pay | Admitting: Emergency Medicine

## 2021-12-19 ENCOUNTER — Other Ambulatory Visit: Payer: Self-pay

## 2021-12-19 DIAGNOSIS — R079 Chest pain, unspecified: Secondary | ICD-10-CM | POA: Insufficient documentation

## 2021-12-19 DIAGNOSIS — R531 Weakness: Secondary | ICD-10-CM | POA: Diagnosis not present

## 2021-12-19 DIAGNOSIS — Z20822 Contact with and (suspected) exposure to covid-19: Secondary | ICD-10-CM | POA: Insufficient documentation

## 2021-12-19 DIAGNOSIS — Z79899 Other long term (current) drug therapy: Secondary | ICD-10-CM | POA: Insufficient documentation

## 2021-12-19 DIAGNOSIS — H538 Other visual disturbances: Secondary | ICD-10-CM | POA: Diagnosis present

## 2021-12-19 DIAGNOSIS — H539 Unspecified visual disturbance: Secondary | ICD-10-CM

## 2021-12-19 LAB — RESP PANEL BY RT-PCR (FLU A&B, COVID) ARPGX2
Influenza A by PCR: NEGATIVE
Influenza B by PCR: NEGATIVE
SARS Coronavirus 2 by RT PCR: NEGATIVE

## 2021-12-19 LAB — COMPREHENSIVE METABOLIC PANEL
ALT: 36 U/L (ref 0–44)
AST: 23 U/L (ref 15–41)
Albumin: 4.4 g/dL (ref 3.5–5.0)
Alkaline Phosphatase: 63 U/L (ref 38–126)
Anion gap: 6 (ref 5–15)
BUN: 14 mg/dL (ref 6–20)
CO2: 26 mmol/L (ref 22–32)
Calcium: 9.7 mg/dL (ref 8.9–10.3)
Chloride: 106 mmol/L (ref 98–111)
Creatinine, Ser: 0.85 mg/dL (ref 0.61–1.24)
GFR, Estimated: >60 mL/min (ref >60)
Glucose, Bld: 108 mg/dL — ABNORMAL HIGH (ref 70–99)
Potassium: 3.7 mmol/L (ref 3.5–5.1)
Sodium: 138 mmol/L (ref 135–145)
Total Bilirubin: 0.9 mg/dL (ref 0.3–1.2)
Total Protein: 8.4 g/dL — ABNORMAL HIGH (ref 6.5–8.1)

## 2021-12-19 LAB — CBC WITH DIFFERENTIAL/PLATELET
Abs Immature Granulocytes: 0 10*3/uL (ref 0.00–0.07)
Basophils Absolute: 0 10*3/uL (ref 0.0–0.1)
Basophils Relative: 1 %
Eosinophils Absolute: 0.1 10*3/uL (ref 0.0–0.5)
Eosinophils Relative: 2 %
HCT: 45.8 % (ref 39.0–52.0)
Hemoglobin: 15.6 g/dL (ref 13.0–17.0)
Immature Granulocytes: 0 %
Lymphocytes Relative: 48 %
Lymphs Abs: 1.6 10*3/uL (ref 0.7–4.0)
MCH: 30.4 pg (ref 26.0–34.0)
MCHC: 34.1 g/dL (ref 30.0–36.0)
MCV: 89.3 fL (ref 80.0–100.0)
Monocytes Absolute: 0.4 10*3/uL (ref 0.1–1.0)
Monocytes Relative: 13 %
Neutro Abs: 1.2 10*3/uL — ABNORMAL LOW (ref 1.7–7.7)
Neutrophils Relative %: 36 %
Platelets: 225 10*3/uL (ref 150–400)
RBC: 5.13 MIL/uL (ref 4.22–5.81)
RDW: 12.3 % (ref 11.5–15.5)
WBC: 3.3 10*3/uL — ABNORMAL LOW (ref 4.0–10.5)
nRBC: 0 % (ref 0.0–0.2)

## 2021-12-19 LAB — CYTOLOGY, (ORAL, ANAL, URETHRAL) ANCILLARY ONLY
Chlamydia: NEGATIVE
Comment: NEGATIVE
Comment: NEGATIVE
Comment: NORMAL
Neisseria Gonorrhea: NEGATIVE
Trichomonas: NEGATIVE

## 2021-12-19 LAB — PROTIME-INR
INR: 1 (ref 0.8–1.2)
Prothrombin Time: 12.9 seconds (ref 11.4–15.2)

## 2021-12-19 LAB — TROPONIN I (HIGH SENSITIVITY)
Troponin I (High Sensitivity): 5 ng/L (ref <18)
Troponin I (High Sensitivity): 4 ng/L (ref <18)

## 2021-12-19 LAB — RAPID URINE DRUG SCREEN, HOSP PERFORMED
Amphetamines: NOT DETECTED
Barbiturates: NOT DETECTED
Benzodiazepines: NOT DETECTED
Cocaine: NOT DETECTED
Opiates: NOT DETECTED
Tetrahydrocannabinol: NOT DETECTED

## 2021-12-19 LAB — URINALYSIS, ROUTINE W REFLEX MICROSCOPIC
Bilirubin Urine: NEGATIVE
Glucose, UA: NEGATIVE mg/dL
Hgb urine dipstick: NEGATIVE
Ketones, ur: NEGATIVE mg/dL
Leukocytes,Ua: NEGATIVE
Nitrite: NEGATIVE
Protein, ur: NEGATIVE mg/dL
Specific Gravity, Urine: 1.002 — ABNORMAL LOW (ref 1.005–1.030)
pH: 7 (ref 5.0–8.0)

## 2021-12-19 LAB — ETHANOL: Alcohol, Ethyl (B): <10 mg/dL (ref <10)

## 2021-12-19 MED ORDER — VALACYCLOVIR HCL 1 G PO TABS: 1000.0000 mg | ORAL_TABLET | Freq: Two times a day (BID) | ORAL | 0 refills | Status: AC

## 2021-12-19 MED ORDER — LORAZEPAM 2 MG/ML IJ SOLN
1.0000 mg | Freq: Once | INTRAMUSCULAR | Status: AC
  Administered 2021-12-19: 1 mg via INTRAVENOUS
  Filled 2021-12-19: qty 1

## 2021-12-19 NOTE — ED Provider Notes (Signed)
?  Physical Exam  ?BP (!) 137/96   Pulse (!) 59   Temp 97.6 ?F (36.4 ?C) (Oral)   Resp 12   SpO2 97%  ? ?Physical Exam ?Constitutional:   ?   General: He is not in acute distress. ?HENT:  ?   Head: Normocephalic.  ?   Nose: Nose normal. No congestion.  ?   Mouth/Throat:  ?   Mouth: Mucous membranes are moist.  ?   Pharynx: Oropharynx is clear.  ?Eyes:  ?   Extraocular Movements: Extraocular movements intact.  ?   Pupils: Pupils are equal, round, and reactive to light.  ?Cardiovascular:  ?   Rate and Rhythm: Normal rate.  ?Pulmonary:  ?   Effort: Pulmonary effort is normal. No respiratory distress.  ?   Breath sounds: Normal breath sounds. No stridor. No wheezing.  ?Abdominal:  ?   General: Abdomen is flat. There is no distension.  ?Musculoskeletal:     ?   General: No swelling or deformity. Normal range of motion.  ?   Cervical back: Normal range of motion. No rigidity.  ?   Right lower leg: No edema.  ?   Left lower leg: No edema.  ?Lymphadenopathy:  ?   Cervical: No cervical adenopathy.  ?Skin: ?   General: Skin is warm.  ?Neurological:  ?   General: No focal deficit present.  ?   Mental Status: He is alert. Mental status is at baseline.  ? ? ?Procedures  ?Procedures ? ?ED Course / MDM  ?  ?Medical Decision Making ?Amount and/or Complexity of Data Reviewed ?Labs: ordered. ?Radiology: ordered. ? ?Risk ?Prescription drug management. ? ? ?Patient is here for blurry vision in the AM on 4/9 that is bilateral  ?Dx of genital herpes 2 days ago  ?Also CP, neg delta trop / COVID/FLU neg  ?RPR neg  ?CT neg  ?MRI pending  ?Plan:  ?If MRI neg, eye doctor for vision check  ?- then f/u with PCP , BP by PCP  ? ?On my assessment, patient is hemodynamically stable and alert.  No focal neurological deficit.  Spoke with family and informed of the MRI result.  Patient MRI did not show anything acute.  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  I believe patient stable for discharge.  Prior to discharge  she was ambulating without gait abnormality.  I have informed family to follow-up with PCP.  Strict return precaution discussed. ? ?Dispo: D/C  ? ? ?  ?Donnamarie Poag, MD ?12/19/21 1631 ? ?  ?Varney Biles, MD ?12/26/21 [phone] ? ?

## 2021-12-19 NOTE — ED Triage Notes (Signed)
Patient coming from home, complaint of vision changes that started yesterday morning, states vision is blurred when looking at things far away. Also complaining of headache this morning and chest pressure. ?

## 2021-12-19 NOTE — ED Notes (Signed)
Patient transported to MRI 

## 2021-12-19 NOTE — ED Notes (Signed)
Patient verbalizes understanding of discharge instructions. Opportunity for questioning and answers were provided. Armband removed by staff, pt discharged from ED.  

## 2021-12-19 NOTE — ED Provider Notes (Signed)
?[facility] ?Provider Note ? ? ?CSN: [id number] ?Arrival date & time: 12/19/21  0844 ? ?  ? ?History ? ?Chief Complaint  ?Patient presents with  ? Visual Field Change  ? ? ?Zachary [name] is a 61 y.o. male. ? ?Pt reports blurry vision that started yesterday at about 7am.  Pt reports it has continued.  Pt also reports his right leg feels weak.  Pt reports he has also had pain in the right side of his chest for a week.  Pt has a history of reflux but reports this feels different.  No fever no chills,   ? ?The history is provided by the patient. No language interpreter was used.  ?Cerebrovascular Accident ?This is a new problem. The problem occurs constantly. The problem has not changed since onset.Associated symptoms include chest pain. Nothing aggravates the symptoms. Nothing relieves the symptoms. He has tried nothing for the symptoms. The treatment provided no relief.  ? ?  ? ?Home Medications ?Prior to Admission medications   ?Medication Sig Start Date End Date Taking? Authorizing Provider  ?albuterol (VENTOLIN HFA) 108 (90 Base) MCG/ACT inhaler Inhale 2 puffs into the lungs every 6 (six) hours as needed for wheezing or shortness of breath (cough). 09/15/20   Elsie Stain, MD  ?cetirizine (ZYRTEC) 10 MG tablet TAKE 1 TABLET (10 MG TOTAL) BY MOUTH DAILY. 11/15/20 11/15/21  Elsie Stain, MD  ?clotrimazole (LOTRIMIN) 1 % cream Apply to affected area 2 times daily 12/12/21   Deno Etienne, DO  ?fluticasone (FLONASE) 50 MCG/ACT nasal spray PLACE 2 SPRAYS INTO BOTH NOSTRILS DAILY. 03/17/21 03/17/22  Elsie Stain, MD  ?pantoprazole (PROTONIX) 40 MG tablet Take 1 tablet (40 mg total) by mouth daily. 12/02/21   Elsie Stain, MD  ?valACYclovir (VALTREX) 1000 MG tablet Take 1 tablet (1,000 mg total) by mouth 2 (two) times daily for 7 days. 12/19/21 12/26/21  LampteyMyrene Galas, MD  ?   ? ?Allergies    ?Patient has no known allergies.   ? ?Review of Systems   ?Review of Systems   ?Eyes:  Positive for visual disturbance.  ?Cardiovascular:  Positive for chest pain.  ?Genitourinary:  Negative for frequency.  ?Neurological:  Positive for dizziness and light-headedness. Negative for syncope, facial asymmetry, speech difficulty and weakness.  ?All other systems reviewed and are negative. ? ?Physical Exam ?Updated Vital Signs ?BP (!) 152/106   Pulse 64   Temp 97.6 ?F (36.4 ?C) (Oral)   Resp 18   SpO2 97%  ?Physical Exam ?Vitals and nursing note reviewed.  ?Constitutional:   ?   General: He is not in acute distress. ?   Appearance: He is well-developed.  ?HENT:  ?   Head: Normocephalic and atraumatic.  ?   Nose: Nose normal.  ?   Mouth/Throat:  ?   Mouth: Mucous membranes are moist.  ?Eyes:  ?   Extraocular Movements: Extraocular movements intact.  ?   Conjunctiva/sclera: Conjunctivae normal.  ?   Pupils: Pupils are equal, round, and reactive to light.  ?Cardiovascular:  ?   Rate and Rhythm: Normal rate and regular rhythm.  ?   Heart sounds: No murmur heard. ?Pulmonary:  ?   Effort: Pulmonary effort is normal. No respiratory distress.  ?   Breath sounds: Normal breath sounds.  ?Abdominal:  ?   Palpations: Abdomen is soft.  ?   Tenderness: There is no abdominal tenderness.  ?Musculoskeletal:     ?  General: No swelling.  ?   Cervical back: Neck supple.  ?Skin: ?   General: Skin is warm and dry.  ?   Capillary Refill: Capillary refill takes less than 2 seconds.  ?Neurological:  ?   Mental Status: He is alert.  ?Psychiatric:     ?   Mood and Affect: Mood normal.  ? ? ?ED Results / Procedures / Treatments   ?Labs ?(all labs ordered are listed, but only abnormal results are displayed) ?Labs Reviewed  ?CBC WITH DIFFERENTIAL/PLATELET - Abnormal; Notable for the following components:  ?    Result Value  ? WBC 3.3 (*)   ? Neutro Abs 1.2 (*)   ? All other components within normal limits  ?COMPREHENSIVE METABOLIC PANEL - Abnormal; Notable for the following components:  ? Glucose, Bld 108 (*)   ? Total  Protein 8.4 (*)   ? All other components within normal limits  ?URINALYSIS, ROUTINE W REFLEX MICROSCOPIC - Abnormal; Notable for the following components:  ? Color, Urine COLORLESS (*)   ? Specific Gravity, Urine 1.002 (*)   ? All other components within normal limits  ?RESP PANEL BY RT-PCR (FLU A&B, COVID) ARPGX2  ?PROTIME-INR  ?RAPID URINE DRUG SCREEN, HOSP PERFORMED  ?ETHANOL  ?TROPONIN I (HIGH SENSITIVITY)  ?TROPONIN I (HIGH SENSITIVITY)  ? ? ?EKG ?EKG Interpretation ? ?Date/Time:  Monday December 19 2021 08:55:51 EDT ?Ventricular Rate:  67 ?PR Interval:  177 ?QRS Duration: 80 ?QT Interval:  401 ?QTC Calculation: 424 ?R Axis:   73 ?Text Interpretation: Sinus rhythm Confirmed by Godfrey Pick [id number]) on 12/19/2021 9:01:17 AM ? ?Radiology ?CT Head Wo Contrast ? ?Result Date: 12/19/2021 ?CLINICAL DATA:  Neuro deficit, stroke suspected EXAM: CT HEAD WITHOUT CONTRAST TECHNIQUE: Contiguous axial images were obtained from the base of the skull through the vertex without intravenous contrast. RADIATION DOSE REDUCTION: This exam was performed according to the departmental dose-optimization program which includes automated exposure control, adjustment of the mA and/or kV according to patient size and/or use of iterative reconstruction technique. COMPARISON:  CT head 03/26/2005 FINDINGS: Brain: There is no evidence of acute intracranial hemorrhage, extra-axial fluid collection, or acute infarct. Parenchymal volume is normal. The ventricles are normal in size. Gray-white differentiation is preserved. There is no mass lesion. There is no mass effect or midline shift. Vascular: No hyperdense vessel or unexpected calcification. Skull: Normal. Negative for fracture or focal lesion. Sinuses/Orbits: The imaged paranasal sinuses are clear. The globes and orbits are unremarkable. Other: None. IMPRESSION: No acute intracranial pathology. Electronically Signed   By: Valetta Mole M.D.   On: 12/19/2021 11:16  ? ?DG Chest Port 1 View ? ?Result  Date: 12/19/2021 ?CLINICAL DATA:  61 year old male with chest pressure and blurred vision since yesterday morning. EXAM: PORTABLE CHEST 1 VIEW COMPARISON:  Chest radiographs 12/01/2021 and earlier. FINDINGS: Portable AP semi upright view at 0953 hours. Lung volumes and mediastinal contours remain normal. Allowing for portable technique the lungs are clear. Visualized tracheal air column is within normal limits. No pneumothorax or pleural effusion. Paucity of bowel gas in the upper abdomen. No acute osseous abnormality identified. IMPRESSION: Negative portable chest. Electronically Signed   By: Genevie Ann M.D.   On: 12/19/2021 10:04   ? ?Procedures ?Procedures  ? ? ?Medications Ordered in ED ?Medications  ?LORazepam (ATIVAN) injection 1 mg (has no administration in time range)  ? ? ?ED Course/ Medical Decision Making/ A&P ?  ?                        ?  Medical Decision Making ?Pt has had blurred vision since yesterday at 7am.  ? ?Amount and/or Complexity of Data Reviewed ?Independent Historian: spouse ?Labs: ordered. Decision-making details documented in ED Course. ?   Details: Labs ordered, reviewed and interpreted.  Pt has a negative troponin x 2. ?Radiology: ordered and independent interpretation performed. Decision-making details documented in ED Course. ?   Details: Chest xray is normal.  Ct head  no acute cva ?ECG/medicine tests: ordered and independent interpretation performed. Decision-making details documented in ED Course. ?   Details: EKG  no acute abnormality ? ?Risk ?Prescription drug management. ?Risk Details: CT scan of patient's head obtained which shows no evidence of CVA obtained.  MRI obtained and is pending.  I turned care over to oncoming provider MRI results pending.  Oncoming provider will do ultrasound of patient's eyes to rule out vitreous hemorrhage ? ? ? ? ? ? ? ? ? ? ?Final Clinical Impression(s) / ED Diagnoses ?Final diagnoses:  ?Visual disturbance  ? ? ?Rx / DC Orders ?ED Discharge Orders    ? ? None  ? ?  ? ? ?  ?Fransico Meadow, [place] ?12/21/21 1556 ? ?  ?Godfrey Pick, MD ?12/21/21 1730 ? ?  ?Godfrey Pick, MD ?12/21/21 1732 ? ?

## 2021-12-20 NOTE — Telephone Encounter (Signed)
Copied from [place] [id number]. Topic: Referral - Request for Referral ?>> Dec 20, 2021  9:30 AM Tessa Lerner A wrote: ?Has patient seen PCP for this complaint? Yes.   ?*If NO, is insurance requiring patient see PCP for this issue before PCP can refer them? ?Referral for which specialty: Eye Specialist  ?Preferred provider/office: [facility]  ?Reason for referral: Eye Exam ?

## 2021-12-21 ENCOUNTER — Emergency Department (HOSPITAL_COMMUNITY)
Admission: EM | Admit: 2021-12-21 | Discharge: 2021-12-21 | Disposition: A | Discharge status: Home / Self Care | Payer: 59 | Attending: Emergency Medicine | Admitting: Emergency Medicine

## 2021-12-21 ENCOUNTER — Encounter (HOSPITAL_COMMUNITY): Payer: Self-pay | Admitting: Emergency Medicine

## 2021-12-21 DIAGNOSIS — J45909 Unspecified asthma, uncomplicated: Secondary | ICD-10-CM | POA: Insufficient documentation

## 2021-12-21 DIAGNOSIS — H538 Other visual disturbances: Secondary | ICD-10-CM | POA: Insufficient documentation

## 2021-12-21 DIAGNOSIS — H539 Unspecified visual disturbance: Secondary | ICD-10-CM

## 2021-12-21 NOTE — ED Triage Notes (Signed)
Per pt, states he was at [facility] for same symptoms on Monday-states they told him to follow up with eye doctor-states he thinks something else is going on-states his vision is getting worse ?

## 2021-12-21 NOTE — ED Provider Notes (Signed)
?[facility] DEPT ?Provider Note ? ? ?CSN: [id number] ?Arrival date & time: 12/21/21  1054 ? ?  ? ?History ?Chief Complaint  ?Patient presents with  ? vision issues  ? ? ?Zachary [name] is a 61 y.o. male with history of GERD, asthma presents to the emergency department for evaluation of continued blurred vision.  The patient was seen here 2 days ago for the same symptoms.  He denies any worsening of his problems, but reports they are not getting any better.  He denies any headaches, tinnitus, unilateral weakness, visual loss, or balance issues.  He reports that he has trouble focusing on objects that are far away.  Denies any diplopia.  He reports he has an eye doctor appointment next week, but it is not soon enough and he wants this problem fixed today. ? ?HPI ? ?  ? ?Home Medications ?Prior to Admission medications   ?Medication Sig Start Date End Date Taking? Authorizing Provider  ?albuterol (VENTOLIN HFA) 108 (90 Base) MCG/ACT inhaler Inhale 2 puffs into the lungs every 6 (six) hours as needed for wheezing or shortness of breath (cough). ?Patient taking differently: Inhale 2 puffs into the lungs 2 (two) times daily as needed for wheezing or shortness of breath (cough). 09/15/20   Elsie Stain, MD  ?cetirizine (ZYRTEC) 10 MG tablet TAKE 1 TABLET (10 MG TOTAL) BY MOUTH DAILY. ?Patient taking differently: Take by mouth daily as needed for allergies. 11/15/20 12/19/21  Elsie Stain, MD  ?clotrimazole (LOTRIMIN) 1 % cream Apply to affected area 2 times daily 12/12/21   Deno Etienne, DO  ?fluticasone (FLONASE) 50 MCG/ACT nasal spray PLACE 2 SPRAYS INTO BOTH NOSTRILS DAILY. ?Patient taking differently: Place 2 sprays into both nostrils daily as needed for allergies. 03/17/21 03/17/22  Elsie Stain, MD  ?pantoprazole (PROTONIX) 40 MG tablet Take 1 tablet (40 mg total) by mouth daily. ?Patient taking differently: Take 40 mg by mouth in the morning and at bedtime. 12/02/21   Elsie Stain, MD  ?valACYclovir (VALTREX) 1000 MG tablet Take 1 tablet (1,000 mg total) by mouth 2 (two) times daily for 7 days. ?Patient not taking: Reported on 12/19/2021 12/19/21 12/26/21  Chase Picket, MD  ?   ? ?Allergies    ?Patient has no known allergies.   ? ?Review of Systems   ?Review of Systems  ?Eyes:  Positive for visual disturbance. Negative for discharge.  ?Neurological:  Negative for dizziness, speech difficulty, weakness, light-headedness, numbness and headaches.  ? ?Physical Exam ?Updated Vital Signs ?BP (!) 149/100 (BP Location: Left Arm)   Pulse 65   Temp 97.8 ?F (36.6 ?C) (Oral)   Resp 17   SpO2 98%  ?Physical Exam ?Vitals and nursing note reviewed.  ?Constitutional:   ?   Appearance: Normal appearance.  ?HENT:  ?   Head: Normocephalic and atraumatic.  ?Eyes:  ?   General: No visual field deficit or scleral icterus. ?Cardiovascular:  ?   Rate and Rhythm: Normal rate and regular rhythm.  ?Pulmonary:  ?   Effort: Pulmonary effort is normal.  ?   Breath sounds: Normal breath sounds.  ?Abdominal:  ?   General: Abdomen is flat. Bowel sounds are normal.  ?   Palpations: Abdomen is soft.  ?Musculoskeletal:     ?   General: No deformity.  ?   Cervical back: Normal range of motion.  ?Skin: ?   General: Skin is warm and dry.  ?Neurological:  ?  General: No focal deficit present.  ?   Mental Status: He is alert. Mental status is at baseline.  ?   GCS: GCS eye subscore is 4. GCS verbal subscore is 5. GCS motor subscore is 6.  ?   Cranial Nerves: No cranial nerve deficit or facial asymmetry.  ?   Sensory: No sensory deficit.  ?   Motor: No weakness.  ?   Coordination: Romberg sign negative. Coordination normal. Finger-Nose-Finger Test normal.  ?   Gait: Gait is intact. Gait normal.  ?   Comments: PERRLA.  EOMI.  Cranial nerves II through XII intact.  Answering questions appropriately with appropriate speech.  No facial droop noted.  Sensation intact throughout.  Strength 5 out of 5 in upper and lower  bilateral extremities.  Normal finger-to-nose.  Romberg negative.  No abnormal coordination.  Normal gait.  No facial asymmetry.  No visual field deficit.  ? ? ?ED Results / Procedures / Treatments   ?Labs ?(all labs ordered are listed, but only abnormal results are displayed) ?Labs Reviewed - No data to display ? ?EKG ?None ? ?Radiology ? ?Procedures ?Procedures  ? ? ?Medications Ordered in ED ?Medications - No data to display ? ?ED Course/ Medical Decision Making/ A&P ?  ?                        ?Medical Decision Making ? ?61 year old male presents emergency department for evaluation of continued blurry vision.  He reports that the problem is not gotten worse, it is just not getting any better.  He has not followed up with an eye doctor yet, but has an appointment next week.  He presents to the emergency room again because he wants something done to fix his blurry vision.  Vital signs show mild elevated blood pressure 149/100, afebrile, normal pulse rate, satting well on room air without any increased work of breathing.  Physical exam is unremarkable.  Regular rate and rhythm.  Lungs are clear to auscultation bilaterally. PERRLA.  EOMI.  Cranial nerves II through XII intact.  Answering questions appropriately with appropriate speech.  No facial droop noted.  Sensation intact throughout.  Strength 5 out of 5 in upper and lower bilateral extremities.  Normal finger-to-nose.  Romberg negative.  No abnormal coordination.  Normal gait.  No facial asymmetry.  No visual field deficit. ? ?The patient was recently seen here 2 days ago on 12-19-2021 and received a CT scan and MRI all showing chronic changes but nothing acute explaining the patient's symptoms.  Since his problem is not worsening, I do not think that is necessary to repeat imaging as the patient does not have any focal neurodeficits. ? ?I discussed with the patient that he will need to follow-up with an eye doctor for his blurred vision.  I discussed with him  to call the office to see if he can get on the cancellation list if he is trying to be seen sooner.  We discussed strict return precautions with him including visual loss, unilateral weakness, headache, problems with his speech, or problems walking to return to the nearest emergency department for reevaluation.  Patient verbalized understanding and agrees to plan.  Patient is stable being discharged home in good condition. ? ?I discussed this case with my attending physician who cosigned this note including patient's presenting symptoms, physical exam, and planned diagnostics and interventions. Attending physician stated agreement with plan or made changes to plan which were implemented.  ? ? ? ?  Final Clinical Impression(s) / ED Diagnoses ?Final diagnoses:  ?None  ? ? ?Rx / DC Orders ?ED Discharge Orders   ? ? None  ? ?  ? ? ?  ?Sherrell Puller, PA-C ?12/25/21 2140 ? ?  ?Godfrey Pick, MD ?12/26/21 1853 ? ?

## 2021-12-21 NOTE — Discharge Instructions (Signed)
You were seen here for your blurry vision. A workup including multiple labs and imaging were performed a few days ago and did not show any cause of your visual problems. Please follow up with your eye doctor appointment on Tuesday as discussed. If you have any vision loss, tunnel vision, weakness on one side of your body, or problems with your speech, please return to the ER immediately.  ?

## 2021-12-23 ENCOUNTER — Encounter: Payer: Self-pay | Admitting: Internal Medicine

## 2021-12-23 ENCOUNTER — Ambulatory Visit: Payer: 59 | Admitting: Internal Medicine

## 2021-12-23 ENCOUNTER — Telehealth: Payer: Self-pay | Admitting: Internal Medicine

## 2021-12-23 VITALS — BP 120/80 | HR 66 | Ht 66.0 in | Wt 145.0 lb

## 2021-12-23 DIAGNOSIS — R1084 Generalized abdominal pain: Secondary | ICD-10-CM

## 2021-12-23 DIAGNOSIS — K219 Gastro-esophageal reflux disease without esophagitis: Secondary | ICD-10-CM | POA: Diagnosis not present

## 2021-12-23 DIAGNOSIS — Z800 Family history of malignant neoplasm of digestive organs: Secondary | ICD-10-CM

## 2021-12-23 DIAGNOSIS — Z1211 Encounter for screening for malignant neoplasm of colon: Secondary | ICD-10-CM | POA: Diagnosis not present

## 2021-12-23 DIAGNOSIS — R630 Anorexia: Secondary | ICD-10-CM

## 2021-12-23 NOTE — Telephone Encounter (Signed)
Patient called, states that the pharmacy did not receive a script for the medications Dr. Lorenso Courier discuss during [place] today. Please advise.  ?

## 2021-12-23 NOTE — Progress Notes (Signed)
? ?Chief Complaint: Abdominal pain ? ?HPI : 61 year old male with history of asthma and knee pain presents with abdominal pain ? ?He started experiencing abdominal pain a couple of months ago. When he went to his doctor, he was told that he had ulcers. This abdominal pain has gotten worse over time.  He was trying to take pantoprazole 40 mg QD, but he could not tolerate the medication because it was causing him to feel overheated. The PPI did help a little with his pain, but he did not feel right. He has been prescribed Bentyl in the past which has also not helped. He has been using Pepto Bismol, which has been helping with the abdominal pain.  He has been experiencing burning in his chest. Feels really full. Used to be heavy drinker when he was younger, but he stopped drinking alcohol 30 years ago. Former smoker. Denies diarrhea or constipation. Saw a little bit of bleeding in the stool recently. He has been doing stool tests for colon cancer screening, which have been negative. Denies dysphagia or N&V. Father and grandfather had colon cancer. Father had colon cancer in his 69s. He has never had an EGD or colonoscopy in the past.  Patient does have issues with headaches and has been taking ibuprofen to help with this.  He has been experiencing some vision changes recently as well as numbness and the left side of his face and left arm. ? ?Wt Readings from Last 3 Encounters:  ?12/23/21 145 lb (65.8 kg)  ?12/12/21 150 lb (68 kg)  ?12/06/21 147 lb (66.7 kg)  ? ?Past Medical History:  ?Diagnosis Date  ? Asthma   ? COVID-19 virus infection 09/15/2020  ? Knee pain   ? ?Past Surgical History:  ?Procedure Laterality Date  ? KNEE ARTHROSCOPY    ? x 2  ? ?Family History  ?Problem Relation Age of Onset  ? Hypertension Mother   ? Cancer Father   ? Cancer Other   ? ?Social History  ? ?Tobacco Use  ? Smoking status: Former  ? Smokeless tobacco: Never  ?Vaping Use  ? Vaping Use: Never used  ?Substance Use Topics  ? Alcohol use: No   ? Drug use: No  ? ?Current Outpatient Medications  ?Medication Sig Dispense Refill  ? albuterol (VENTOLIN HFA) 108 (90 Base) MCG/ACT inhaler Inhale 2 puffs into the lungs every 6 (six) hours as needed for wheezing or shortness of breath (cough). (Patient taking differently: Inhale 2 puffs into the lungs 2 (two) times daily as needed for wheezing or shortness of breath (cough).) 18 g 0  ? clotrimazole (LOTRIMIN) 1 % cream Apply to affected area 2 times daily 15 g 0  ? fluticasone (FLONASE) 50 MCG/ACT nasal spray PLACE 2 SPRAYS INTO BOTH NOSTRILS DAILY. (Patient taking differently: Place 2 sprays into both nostrils daily as needed for allergies.) 16 g 6  ? valACYclovir (VALTREX) 1000 MG tablet Take 1 tablet (1,000 mg total) by mouth 2 (two) times daily for 7 days. 14 tablet 0  ? cetirizine (ZYRTEC) 10 MG tablet TAKE 1 TABLET (10 MG TOTAL) BY MOUTH DAILY. (Patient taking differently: Take by mouth daily as needed for allergies.) 30 tablet 11  ? ?No current facility-administered medications for this visit.  ? ?No Known Allergies ? ? ?Review of Systems: ?All systems reviewed and negative except where noted in HPI.  ? ?Physical Exam: ?BP 120/80   Pulse 66   Ht '5\' 6"'$  (1.676 m)   Wt 145  lb (65.8 kg)   BMI 23.40 kg/m?  ?Constitutional: Pleasant,well-developed, male in no acute distress. ?HEENT: Normocephalic and atraumatic. Conjunctivae are normal. No scleral icterus. ?Cardiovascular: Normal rate, regular rhythm.  ?Pulmonary/chest: Effort normal and breath sounds normal. No wheezing, rales or rhonchi. ?Abdominal: Soft, nondistended, tender to palpation throughout the abdomen ?Extremities: No edema ?Neurological: Alert and oriented to person place and time. ?Skin: Skin is warm and dry. No rashes noted. ?Psychiatric: Normal mood and affect. Behavior is normal. ? ?Labs 03/2020: FOBT negative. ? ?Labs 11/2021: Lipase mildly elevated at 56. ? ?Labs 12/2021: CMP unremarkable. CBC with mildly low WBC of 3.3. UDS negative.  Troponin negative. HCV antibody and HIV negative. ? ?Ab U/S 12/01/21: ?IMPRESSION: ?1. No acute abnormality to account for the patient's symptoms. ?Normal abdominal ultrasound ? ?ASSESSMENT AND PLAN: ?Diffuse abdominal pain ?GERD ?Loss of appetite ?Colon cancer screening ?Family history of colon cancer ?Patient presents with abdominal pain that started a few months ago.  Unclear etiology at this time.  Could potentially consider contribution from PUD since the patient endorses NSAID use.  I will plan to start the patient on omeprazole 20 mg daily.  I will also go ahead and obtain CT scan and EGD for further evaluation.  Patient has never had a colonoscopy for colon cancer screening in the past despite his family history of colon cancer.  Thus we will plan to proceed with colonoscopy for colon cancer screening.  I went over the risks and benefits of EGD and colonoscopy with the patient in detail, and he is agreeable to proceeding. ?- GERD friendly  ?- Start omeprazole 20 mg QD ?- Reduce NSAID use. Okay to use Tylenol PRN. ?- CT A/P w/contrast ?- EGD/colonoscopy [facility] ? ?[name], MD ? ?

## 2021-12-23 NOTE — Patient Instructions (Signed)
If you are age 60 or older, your body mass index should be between 23-30. Your Body mass index is 23.4 kg/m?Marland Kitchen If this is out of the aforementioned range listed, please consider follow up with your Primary Care Provider. ? ?If you are age 44 or younger, your body mass index should be between 19-25. Your Body mass index is 23.4 kg/m?Marland Kitchen If this is out of the aformentioned range listed, please consider follow up with your Primary Care Provider.  ? ?We have sent the following medications to your pharmacy for you to pick up at your convenience: Omeprazole 20 mg daily. ? ?You will be contacted by [facility] in the next 2 days to arrange a CT abdomen and Pelvis.  The number on your caller ID will be [phone], please answer when they call.  If you have not heard from them in 2 days please call [phone] to schedule.    ? ? ?The [facility] providers would like to encourage you to use [facility] to communicate with providers for non-urgent requests or questions.  Due to long hold times on the telephone, sending your provider a message by [facility] may be a faster and more efficient way to get a response.  Please allow 48 business hours for a response.  Please remember that this is for non-urgent requests.  ? ?It was a pleasure to see you today! ? ?Thank you for trusting me with your gastrointestinal care!   ? ?[name], MD  ? ?

## 2021-12-24 ENCOUNTER — Emergency Department (HOSPITAL_COMMUNITY): Payer: 59

## 2021-12-24 ENCOUNTER — Encounter (HOSPITAL_COMMUNITY): Payer: Self-pay | Admitting: Emergency Medicine

## 2021-12-24 ENCOUNTER — Other Ambulatory Visit: Payer: Self-pay

## 2021-12-24 ENCOUNTER — Emergency Department (HOSPITAL_COMMUNITY)
Admission: EM | Admit: 2021-12-24 | Discharge: 2021-12-24 | Disposition: A | Discharge status: Home / Self Care | Payer: 59 | Attending: Emergency Medicine | Admitting: Emergency Medicine

## 2021-12-24 DIAGNOSIS — R079 Chest pain, unspecified: Secondary | ICD-10-CM

## 2021-12-24 DIAGNOSIS — R1013 Epigastric pain: Secondary | ICD-10-CM | POA: Insufficient documentation

## 2021-12-24 DIAGNOSIS — M549 Dorsalgia, unspecified: Secondary | ICD-10-CM

## 2021-12-24 DIAGNOSIS — R072 Precordial pain: Secondary | ICD-10-CM | POA: Diagnosis present

## 2021-12-24 DIAGNOSIS — M546 Pain in thoracic spine: Secondary | ICD-10-CM | POA: Insufficient documentation

## 2021-12-24 DIAGNOSIS — R52 Pain, unspecified: Secondary | ICD-10-CM

## 2021-12-24 DIAGNOSIS — F419 Anxiety disorder, unspecified: Secondary | ICD-10-CM | POA: Diagnosis not present

## 2021-12-24 HISTORY — DX: Gastro-esophageal reflux disease without esophagitis: K21.9

## 2021-12-24 LAB — CBC
HCT: 42.9 % (ref 39.0–52.0)
Hemoglobin: 14.8 g/dL (ref 13.0–17.0)
MCH: 30.6 pg (ref 26.0–34.0)
MCHC: 34.5 g/dL (ref 30.0–36.0)
MCV: 88.6 fL (ref 80.0–100.0)
Platelets: 233 10*3/uL (ref 150–400)
RBC: 4.84 MIL/uL (ref 4.22–5.81)
RDW: 12.1 % (ref 11.5–15.5)
WBC: 3.8 10*3/uL — ABNORMAL LOW (ref 4.0–10.5)
nRBC: 0 % (ref 0.0–0.2)

## 2021-12-24 LAB — BASIC METABOLIC PANEL
Anion gap: 5 (ref 5–15)
BUN: 8 mg/dL (ref 6–20)
CO2: 28 mmol/L (ref 22–32)
Calcium: 9.5 mg/dL (ref 8.9–10.3)
Chloride: 106 mmol/L (ref 98–111)
Creatinine, Ser: 0.84 mg/dL (ref 0.61–1.24)
GFR, Estimated: >60 mL/min (ref >60)
Glucose, Bld: 134 mg/dL — ABNORMAL HIGH (ref 70–99)
Potassium: 3.8 mmol/L (ref 3.5–5.1)
Sodium: 139 mmol/L (ref 135–145)

## 2021-12-24 LAB — LIPASE, BLOOD: Lipase: 36 U/L (ref 11–51)

## 2021-12-24 LAB — TROPONIN I (HIGH SENSITIVITY)
Troponin I (High Sensitivity): 5 ng/L (ref <18)
Troponin I (High Sensitivity): 4 ng/L (ref <18)

## 2021-12-24 LAB — URINALYSIS, ROUTINE W REFLEX MICROSCOPIC
Bilirubin Urine: NEGATIVE
Glucose, UA: NEGATIVE mg/dL
Hgb urine dipstick: NEGATIVE
Ketones, ur: NEGATIVE mg/dL
Leukocytes,Ua: NEGATIVE
Nitrite: NEGATIVE
Protein, ur: NEGATIVE mg/dL
Specific Gravity, Urine: 1.015 (ref 1.005–1.030)
pH: 7 (ref 5.0–8.0)

## 2021-12-24 LAB — HEPATIC FUNCTION PANEL
ALT: 29 U/L (ref 0–44)
AST: 19 U/L (ref 15–41)
Albumin: 4.5 g/dL (ref 3.5–5.0)
Alkaline Phosphatase: 62 U/L (ref 38–126)
Bilirubin, Direct: 0.1 mg/dL (ref 0.0–0.2)
Indirect Bilirubin: 0.9 mg/dL (ref 0.3–0.9)
Total Bilirubin: 1 mg/dL (ref 0.3–1.2)
Total Protein: 8.2 g/dL — ABNORMAL HIGH (ref 6.5–8.1)

## 2021-12-24 MED ORDER — LIDOCAINE VISCOUS HCL 2 % MT SOLN
15.0000 mL | Freq: Once | OROMUCOSAL | Status: AC
Start: 2021-12-24 — End: 2021-12-24
  Administered 2021-12-24: 15 mL via ORAL
  Filled 2021-12-24: qty 15

## 2021-12-24 MED ORDER — ALUM & MAG HYDROXIDE-SIMETH 200-200-20 MG/5ML PO SUSP
30.0000 mL | Freq: Once | ORAL | Status: AC
Start: 2021-12-24 — End: 2021-12-24
  Administered 2021-12-24: 30 mL via ORAL
  Filled 2021-12-24: qty 30

## 2021-12-24 MED ORDER — IOHEXOL 350 MG/ML SOLN
100.0000 mL | Freq: Once | INTRAVENOUS | Status: AC | PRN
  Administered 2021-12-24: 100 mL via INTRAVENOUS

## 2021-12-24 MED ORDER — SUCRALFATE 1 G PO TABS: 1.0000 g | ORAL_TABLET | Freq: Three times a day (TID) | ORAL | 0 refills | Status: DC

## 2021-12-24 NOTE — ED Triage Notes (Signed)
Pt reports pain across shoulders, back, and chest x 1 week that is worse since last night.  Reports SOB with exertion.  Denies nausea and vomiting.  States he is taking medication for GERD and has a history of ulcers. ?

## 2021-12-24 NOTE — ED Provider Triage Note (Signed)
Emergency Medicine Provider Triage Evaluation Note ? ?Zachary [name] , a 61 y.o. male  was evaluated in triage.  Pt complains of midline chest tightness that goes through the left arm.  Also says his face feels numb.  Says all of this has been going on for a week.  Says he has been taking acid reflux pills and seen by GI yesterday however continues to have discomfort.  No history of CVA or ACS. ? ?Review of Systems  ?Positive: As above ?Negative: Shortness of breath, nausea or vomiting ? ?Physical Exam  ?BP 123/82 (BP Location: Right Arm)   Pulse 79   Temp 98 ?F (36.7 ?C) (Oral)   Resp 16   SpO2 99%  ?Gen:   Awake, no distress   ?Resp:  Normal effort  ?MSK:   Moves extremities without difficulty  ?Other:  RRR, CTA B ? ?Medical Decision Making  ?Medically screening exam initiated at 9:21 AM.  Appropriate orders placed.  Zachary [name] was informed that the remainder of the evaluation will be completed by another provider, this initial triage assessment does not replace that evaluation, and the importance of remaining in the ED until their evaluation is complete. ? ? ?  ?Rhae Hammock, PA-C ?12/24/21 4665 ? ?

## 2021-12-24 NOTE — ED Provider Notes (Signed)
?[facility] ?Provider Note ? ? ?CSN: [id number] ?Arrival date & time: 12/24/21  0906 ? ?  ? ?History ? ?Chief Complaint  ?Patient presents with  ? Chest Pain  ? ? ?Zachary [name] is a 61 y.o. male. ? ?The history is provided by the patient and medical records. No language interpreter was used.  ?Chest Pain ?Pain location:  Substernal area and epigastric ?Pain quality: burning   ?Pain radiates to:  Upper back and epigastrium ?Pain severity:  Severe ?Onset quality:  Gradual ?Timing:  Constant ?Progression:  Waxing and waning ?Chronicity:  Recurrent ?Relieved by:  Nothing ?Worsened by:  Nothing ?Ineffective treatments:  None tried ?Associated symptoms: abdominal pain and back pain   ?Associated symptoms: no altered mental status, no claudication, no cough, no diaphoresis, no dizziness, no fatigue, no fever, no headache, no lower extremity edema, no nausea, no numbness, no palpitations, no shortness of breath and no vomiting   ?Risk factors: male sex   ? ?  ? ?Home Medications ?Prior to Admission medications   ?Medication Sig Start Date End Date Taking? Authorizing Provider  ?albuterol (VENTOLIN HFA) 108 (90 Base) MCG/ACT inhaler Inhale 2 puffs into the lungs every 6 (six) hours as needed for wheezing or shortness of breath (cough). ?Patient taking differently: Inhale 2 puffs into the lungs 2 (two) times daily as needed for wheezing or shortness of breath (cough). 09/15/20   Elsie Stain, MD  ?cetirizine (ZYRTEC) 10 MG tablet TAKE 1 TABLET (10 MG TOTAL) BY MOUTH DAILY. ?Patient taking differently: Take by mouth daily as needed for allergies. 11/15/20 12/19/21  Elsie Stain, MD  ?clotrimazole (LOTRIMIN) 1 % cream Apply to affected area 2 times daily 12/12/21   Deno Etienne, DO  ?fluticasone (FLONASE) 50 MCG/ACT nasal spray PLACE 2 SPRAYS INTO BOTH NOSTRILS DAILY. ?Patient taking differently: Place 2 sprays into both nostrils daily as needed for allergies. 03/17/21 03/17/22  Elsie Stain, MD  ?valACYclovir (VALTREX) 1000 MG tablet Take 1 tablet (1,000 mg total) by mouth 2 (two) times daily for 7 days. 12/19/21 12/26/21  LampteyMyrene Galas, MD  ?   ? ?Allergies    ?Patient has no known allergies.   ? ?Review of Systems   ?Review of Systems  ?Constitutional:  Negative for chills, diaphoresis, fatigue and fever.  ?HENT:  Negative for congestion.   ?Eyes:  Negative for visual disturbance.  ?Respiratory:  Negative for cough, chest tightness, shortness of breath and wheezing.   ?Cardiovascular:  Positive for chest pain. Negative for palpitations, claudication and leg swelling.  ?Gastrointestinal:  Positive for abdominal pain. Negative for constipation, diarrhea, nausea and vomiting.  ?Genitourinary:  Negative for flank pain.  ?Musculoskeletal:  Positive for back pain. Negative for neck pain and neck stiffness.  ?Skin:  Negative for rash and wound.  ?Neurological:  Negative for dizziness, numbness and headaches.  ?Psychiatric/Behavioral:  Negative for agitation and confusion.   ? ?Physical Exam ?Updated Vital Signs ?BP 123/82 (BP Location: Right Arm)   Pulse 79   Temp 98 ?F (36.7 ?C) (Oral)   Resp 16   SpO2 99%  ?Physical Exam ?Vitals and nursing note reviewed.  ?Constitutional:   ?   General: He is not in acute distress. ?   Appearance: He is well-developed. He is not ill-appearing, toxic-appearing or diaphoretic.  ?HENT:  ?   Head: Normocephalic and atraumatic.  ?Eyes:  ?   Extraocular Movements: Extraocular movements intact.  ?   Conjunctiva/sclera: Conjunctivae normal.  ?  Pupils: Pupils are equal, round, and reactive to light.  ?Cardiovascular:  ?   Rate and Rhythm: Normal rate and regular rhythm.  ?   Heart sounds: Normal heart sounds. No murmur heard. ?Pulmonary:  ?   Effort: Pulmonary effort is normal. No respiratory distress.  ?   Breath sounds: Normal breath sounds. No decreased breath sounds, wheezing, rhonchi or rales.  ?Chest:  ?   Chest wall: No tenderness.  ?Abdominal:  ?    Palpations: Abdomen is soft.  ?   Tenderness: There is abdominal tenderness.  ?Musculoskeletal:     ?   General: No swelling.  ?   Cervical back: Neck supple.  ?   Right lower leg: No tenderness. No edema.  ?   Left lower leg: No tenderness. No edema.  ?Skin: ?   General: Skin is warm and dry.  ?   Capillary Refill: Capillary refill takes less than 2 seconds.  ?   Coloration: Skin is not pale.  ?   Findings: No ecchymosis, erythema or rash.  ?Neurological:  ?   General: No focal deficit present.  ?   Mental Status: He is alert.  ?Psychiatric:     ?   Mood and Affect: Mood is anxious.  ? ? ?ED Results / Procedures / Treatments   ?Labs ?(all labs ordered are listed, but only abnormal results are displayed) ?Labs Reviewed  ?BASIC METABOLIC PANEL - Abnormal; Notable for the following components:  ?    Result Value  ? Glucose, Bld 134 (*)   ? All other components within normal limits  ?CBC - Abnormal; Notable for the following components:  ? WBC 3.8 (*)   ? All other components within normal limits  ?HEPATIC FUNCTION PANEL - Abnormal; Notable for the following components:  ? Total Protein 8.2 (*)   ? All other components within normal limits  ?URINE CULTURE  ?URINALYSIS, ROUTINE W REFLEX MICROSCOPIC  ?LIPASE, BLOOD  ?TROPONIN I (HIGH SENSITIVITY)  ?TROPONIN I (HIGH SENSITIVITY)  ? ? ?EKG ?EKG Interpretation ? ?Date/Time:  Saturday December 24 2021 09:11:03 EDT ?Ventricular Rate:  73 ?PR Interval:  178 ?QRS Duration: 70 ?QT Interval:  366 ?QTC Calculation: 403 ?R Axis:   80 ?Text Interpretation: Normal sinus rhythm Cannot rule out Anterior infarct , age undetermined Abnormal ECG When compared with ECG of 19-Dec-2021 08:55, PREVIOUS ECG IS PRESENT when compared to prior, similar appearance. NO STEMI Confirmed by Antony Blackbird [phone]) on 12/24/2021 12:39:41 PM ? ?Radiology ?DG Chest 2 View ? ?Result Date: 12/24/2021 ?CLINICAL DATA:  Epigastric pain and burning in chest since Monday accompanied by sob. EXAM: CHEST - 2 VIEW  COMPARISON:  12/19/2021. FINDINGS: Normal heart, mediastinum and hila. Clear lungs.  No pleural effusion or pneumothorax. Skeletal structures are intact. IMPRESSION: No active cardiopulmonary disease. Electronically Signed   By: Lajean Manes M.D.   On: 12/24/2021 10:34  ? ?CT Angio Chest/Abd/Pel for Dissection W and/or Wo Contrast ? ?Result Date: 12/24/2021 ?CLINICAL DATA:  Chest pain or back pain, aortic dissection suspected Previous abdominal pain, now pain in chest and between shoulder blades with blood pressure in the 150s. Rule out acute dissection or other intrathoracic abnormality causing symptoms. EXAM: CT ANGIOGRAPHY CHEST, ABDOMEN AND PELVIS TECHNIQUE: Non-contrast CT of the chest was initially obtained. Multidetector CT imaging through the chest, abdomen and pelvis was performed using the standard protocol during bolus administration of intravenous contrast. Multiplanar reconstructed images and MIPs were obtained and reviewed to evaluate the vascular anatomy. RADIATION  DOSE REDUCTION: This exam was performed according to the departmental dose-optimization program which includes automated exposure control, adjustment of the mA and/or kV according to patient size and/or use of iterative reconstruction technique. CONTRAST:  157m OMNIPAQUE IOHEXOL 350 MG/ML SOLN COMPARISON:  None. FINDINGS: CTA CHEST Cardiovascular: Preferential opacification of the thoracic aorta. No evidence of thoracic aortic aneurysm or dissection. Note is made of a common origin of the right brachiocephalic and left common carotid arteries from the aortic arch. Minimal atherosclerotic plaque along the aortic arch. The pulmonary arteries are normal in caliber without central filling defect. Normal heart size. No pericardial effusion. Mediastinum/Nodes: No enlarged mediastinal, hilar, or axillary lymph nodes. The thyroid gland appears normal. Lungs/Pleura: No pleural effusion. No pneumothorax. No mass or focal consolidation. No suspicious  pulmonary nodules. CTA ABDOMEN AND PELVIS VASCULAR Aorta: Normal caliber aorta without aneurysm, dissection, vasculitis or significant stenosis. Minimal scattered atherosclerotic plaque. Celiac: Patent without evi

## 2021-12-24 NOTE — Discharge Instructions (Signed)
Your history, exam, work-up today were overall reassuring and does not appear to have a lung, heart, or aortic cause of your discomfort today.  The imaging was overall reassuring and your heart enzymes were negative times we checked them.  Given your history of GI pains I suspect your symptoms may be related to reflux, GERD, and even ulceration.  Please use the Carafate to add to your regimen and call your GI team in the next few days to discuss closer follow-up.  Please rest and stay hydrated.  If any symptoms change or worsen acutely, please return to the nearest emergency department. ?

## 2021-12-25 LAB — URINE CULTURE: Culture: NO GROWTH

## 2021-12-26 ENCOUNTER — Other Ambulatory Visit: Payer: Self-pay

## 2021-12-26 ENCOUNTER — Telehealth: Payer: Self-pay | Admitting: Internal Medicine

## 2021-12-26 MED ORDER — OMEPRAZOLE 20 MG PO CPDR: 20.0000 mg | DELAYED_RELEASE_CAPSULE | Freq: Every day | ORAL | 3 refills | Status: DC

## 2021-12-26 NOTE — Telephone Encounter (Signed)
Omeprazole 20 mg daily sent to patients pharmacy. ?

## 2021-12-26 NOTE — Telephone Encounter (Signed)
Inbound call from patient states he had a CT scan over the weekend and was told to call so he didn't have to repeat the scan ?

## 2021-12-27 ENCOUNTER — Ambulatory Visit: Payer: 59 | Attending: Critical Care Medicine | Admitting: Critical Care Medicine

## 2021-12-27 ENCOUNTER — Telehealth: Payer: Self-pay

## 2021-12-27 ENCOUNTER — Other Ambulatory Visit: Payer: Self-pay

## 2021-12-27 ENCOUNTER — Encounter: Payer: Self-pay | Admitting: Critical Care Medicine

## 2021-12-27 VITALS — BP 143/91 | HR 67 | Wt 143.6 lb

## 2021-12-27 DIAGNOSIS — K056 Periodontal disease, unspecified: Secondary | ICD-10-CM | POA: Diagnosis not present

## 2021-12-27 DIAGNOSIS — K029 Dental caries, unspecified: Secondary | ICD-10-CM

## 2021-12-27 DIAGNOSIS — F32A Depression, unspecified: Secondary | ICD-10-CM | POA: Insufficient documentation

## 2021-12-27 DIAGNOSIS — Z800 Family history of malignant neoplasm of digestive organs: Secondary | ICD-10-CM

## 2021-12-27 DIAGNOSIS — F419 Anxiety disorder, unspecified: Secondary | ICD-10-CM | POA: Diagnosis not present

## 2021-12-27 DIAGNOSIS — K219 Gastro-esophageal reflux disease without esophagitis: Secondary | ICD-10-CM

## 2021-12-27 DIAGNOSIS — R1013 Epigastric pain: Secondary | ICD-10-CM

## 2021-12-27 DIAGNOSIS — R630 Anorexia: Secondary | ICD-10-CM

## 2021-12-27 DIAGNOSIS — K4090 Unilateral inguinal hernia, without obstruction or gangrene, not specified as recurrent: Secondary | ICD-10-CM

## 2021-12-27 DIAGNOSIS — Z1211 Encounter for screening for malignant neoplasm of colon: Secondary | ICD-10-CM

## 2021-12-27 DIAGNOSIS — R1084 Generalized abdominal pain: Secondary | ICD-10-CM

## 2021-12-27 MED ORDER — AMLODIPINE BESYLATE 5 MG PO TABS: 5.0000 mg | ORAL_TABLET | Freq: Every day | ORAL | 2 refills | Status: DC

## 2021-12-27 MED ORDER — SERTRALINE HCL 50 MG PO TABS: 50.0000 mg | ORAL_TABLET | Freq: Every day | ORAL | 3 refills | Status: DC

## 2021-12-27 MED ORDER — OMEPRAZOLE 20 MG PO CPDR: 20.0000 mg | DELAYED_RELEASE_CAPSULE | Freq: Two times a day (BID) | ORAL | 3 refills | Status: DC

## 2021-12-27 NOTE — Progress Notes (Signed)
? ?Established Patient Office Visit ? ?Subjective:  ?Patient ID: Zachary [name], male    DOB: [birth year]  Age: 61 y.o. MRN: [id number] ? ?CC:  ?Emergency room follow-up ? ?HPI ?10/11/21 ?Zachary [name] presents for follow up after ED visit for cough. This problem has persisted for a long time, but has been worse over the past two weeks. He last saw Dr. Joya [name] in this office on 08/23/2021 and was diagnosed with acute recurrent maxillary sinusitis which was treated with a 5 day course of azithromycin. This helped with his symptoms for awhile but the problem returned.  ? ?He saw [name] for this issue on 09/23/2021 via telemedicine visit. At that time, he was diagnosed with acute bronchitis and prescribed promethazine cough syrup. He then presented to the ED on 09/30/21 for productive cough, bloating, abdominal fullness, and reflux symptoms. ED workup revealed no additional findings. At that time, supportive care was recommended for the cough, and treatment of GERD symptoms with Protonix and Carafate.  ? ?He reports that today he is still experiencing productive cough, shortness of breath, chest tightness, and some wheezing. He still has congestion, sinus pain, rhinorrhea, and sinus pressure. His chest hurts from the amount of coughing he has been doing. He is unaware of fevers, but he has been waking up with sweating at night occasionally for the past few days. He also has headaches throughout the day. He has been feeling a lot of nausea that he thinks is related to the cough. He has used cough syrup as prescribed as well as Mucinex, Flonase, and cetirizine daily. He thinks that these help, but only for a short amount of time. This issue has left him feeling very fatigued, but otherwise his mood is fine. ? ?12/06/21 ?Patient returns after having been in the emergency room recently for abdominal pain below is documentation from the ER visit ?In ED for Abdominal pain  ? I was informed by tech that patient  refused to go in scanner ? [EH]  ?0421 Ativan had been ordered for anxiety and claustrophobia for CT scan, however patient refused this.  I spoke with patient, he refuses to try the Ativan saying that he knows it will not work and that he needs to be " knocked out" for a CAT scan on his abdomen. ?I explained to him that general anesthesia for a CT scan on his abdomen is not currently possible or appropriate.  He refuses to even try the Ativan to see how that makes him feel.  He will allow me to obtain a ultrasound on his abdomen. ?We had a extensive discussion that even with a ultrasound on his abdomen I am unable to rule out life threatening causes including those that may cause pain, worsening of condition, disability that may be severe and other complications and he states his understanding. [EH]  ?0531 Patient continues to refuse CT scan or to attempt to try the ativan.  I asked him if there was anything I could do to allow him to obtain the CT scan today after we discussed the purpose of the CT scan and he stated that unless I was able to fully knock him out that there was nothing I can do.  He insists that he will be back later today for the scan.  When I asked him what will change between now and then he tells me "I just need time to think about it." [EH]  ?   ?Clinical Course User Index ?[EH] [name],  [name], PA-C  ?  ?                        ?Medical Decision Making ?Patient is a 61 year old man who presents today for evaluation of abdominal pain.  On my exam his abdomen is generally tender. ?  ?Labs are obtained and reviewed, please see below. ?I recommended a CT abdomen pelvis with contrast.  Patient refused this. ?I gave him pain meds, nausea meds.  I recommended a dose of Ativan as he reports he is claustrophobic.  He did attempt to go to CT scan once however once the bed began moving he refused to continue. ?Patient refused to try Ativan.  He told me multiple times that it would not work despite  never having it before. ?Please see ED course for further details. ?Ultimately patient continued to refuse to try anxiolytics or CT scan. ?We discussed risks of this and that he is making this decision [place].  We discussed that I am unable to rule out life-threatening or other serious potential causes of his symptoms and abdominal pain.  He states his understanding of this.  There is no family at bedside for me to try to have convince patient to stay. ?  ?He states that there is nothing I can do to allow him to have the CT scan short of general anesthetic which I informed him is not something I am able to provide and do not think it is appropriate especially as he will not even try anxiolytics. ?  ?I have attempted to treat and evaluate patient with in the limits of what he will allow.  ?  ?Patient will sign out AMA.  ?  ?Amount and/or Complexity of Data Reviewed ?Labs: ordered. ?   Details: CBC and CMP are unremarkable.  Lipase is minimally elevated at 56.  Troponin x2 is not elevated. ?Radiology: ordered. ?   Details: I ordered a CT abdomen pelvis with contrast that patient refused, please see ED course.  Chest x-ray without consolidation pneumothorax or other abnormalities.  Abdominal ultrasound obtained without cause for his symptoms found. ?ECG/medicine tests: ordered. ?  ?Risk ?Prescription drug management. ?Parenteral controlled substances. ?Diagnosis or treatment significantly limited by social determinants of health. ?Risk Details: Patient refused imaging, left AMA.  ?  ?Note the patient left AGAINST MEDICAL ADVICE his labs showed elevated lipase and ultrasound of the abdomen did not show specific cause of his symptoms.  He had significant epigastric abdominal pain with nausea and occasional emesis.  He would regurgitate some bile colored material.  He does not drink alcohol is not currently smoking cigarettes.  The abdominal pain has subsided somewhat with the use of pantoprazole.   Unfortunately his insurance would only pay for once daily pantoprazole.  He denies constipation or diarrhea.  There is no blood in the stool no blood in the emesis. ? ?Patient comes to the office today after having been on the pantoprazole for further follow-up and evaluations.  Note his prior visit in January revolved around acute maxillary sinusitis recurrent which now has resolved. ? ?12/27/2021 ?Mult ED visits since last OV ?This patient since last being seen end of March has been to the emergency room 5 times for variety of issues.  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  He then had visual blurring found  to have elevated blood pressure with this.  He had a negative cardiac work-up that he had abdominal pain and chest pain ER visits.  He ultimately had a complete CT scan of the chest and abdomen which was negative.  He had a extensive laboratory work-up that was unremarkable.  He has also seen gastroenterology below is documentation from that visit. ? ?61 year old male with history of asthma and knee pain presents with abdominal pain ?  ?He started experiencing abdominal pain a couple of months ago. When he went to his doctor, he was told that he had ulcers. This abdominal pain has gotten worse over time.  He was trying to take pantoprazole 40 mg QD, but he could not tolerate the medication because it was causing him to feel overheated. The PPI did help a little with his pain, but he did not feel right. He has been prescribed Bentyl in the past which has also not helped. He has been using Pepto Bismol, which has been helping with the abdominal pain.  He has been experiencing burning in his chest. Feels really full. Used to be heavy drinker when he was younger, but he stopped drinking alcohol 30 years ago. Former smoker. Denies diarrhea or constipation. Saw a little bit of bleeding in the  stool recently. He has been doing stool tests for colon cancer screening, which have been negative. Denies dysphagia or N&V. Father and grandfather had colon cancer. Father had colon cancer in his 32s. He has

## 2021-12-27 NOTE — Patient Instructions (Addendum)
We are working to get your upper endoscopy moved up if possible>> this will occur at Friday at 1130am  arrive 1030am ? ?Start the Carafate 4 times daily ? ?Resume omeprazole 1 twice daily take half hour before you eat then eat something ? ?Start sertraline one pill daily for anxiety ? ?Get the Ensure supplements and use those 3 times a day for meals until you get your endoscopy ? ?Please get a dental exam to have the remaining teeth removed when you are able to do so ? ?I am going to start a low-dose blood pressure medication 1 pill daily amlodipine ? 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 ?You do not need another abdominal CT scan the one that was obtained in the ER was normal ? ?Follow reflux diet as attached ? ?Return to Dr. Joya [name] 6 weeks ? ?Please sign up for my chart and send me my chart messages rather than going to the emergency room every other day as you have been doing ?

## 2021-12-27 NOTE — Assessment & Plan Note (Signed)
As per abdominal assessment ?

## 2021-12-27 NOTE — Assessment & Plan Note (Signed)
No evidence of obstruction we will continue to observe lets get the upper endoscopy done first ?

## 2021-12-27 NOTE — Telephone Encounter (Signed)
Called patient and rescheduled his Endoscopy for Thursday 12/29/21 @ 10am, Per Dr.Dorsey. ?

## 2021-12-27 NOTE — Assessment & Plan Note (Signed)
Planning dental evaluation ?

## 2021-12-27 NOTE — Assessment & Plan Note (Signed)
Persisting abdominal pain he has been seen by gastroenterology I did call and speak to Dr. Lorenso Courier and she agrees to move up his upper endoscopy to Friday, April 21 at 11:30 AM the patient is aware of this he is to arrive an hour before and to be n.p.o. after midnight. ? ?In the interim he will take his Carafate as prescribed and increase omeprazole to twice daily I also gave him a reflux diet and nutritional supplement Ensure ?

## 2021-12-27 NOTE — Assessment & Plan Note (Signed)
This is playing a role in the patient's symptom complex we will begin sertraline low-dose 50 mg daily ?

## 2021-12-27 NOTE — Assessment & Plan Note (Signed)
The patient is planning dental evaluation soon ?

## 2021-12-29 ENCOUNTER — Encounter: Payer: Self-pay | Admitting: Internal Medicine

## 2021-12-29 ENCOUNTER — Ambulatory Visit (AMBULATORY_SURGERY_CENTER): Payer: 59 | Admitting: Internal Medicine

## 2021-12-29 VITALS — BP 113/80 | HR 66 | Temp 98.2°F | Resp 13 | Ht 66.0 in | Wt 145.0 lb

## 2021-12-29 DIAGNOSIS — R1084 Generalized abdominal pain: Secondary | ICD-10-CM | POA: Diagnosis not present

## 2021-12-29 DIAGNOSIS — K317 Polyp of stomach and duodenum: Secondary | ICD-10-CM | POA: Diagnosis not present

## 2021-12-29 DIAGNOSIS — K2960 Other gastritis without bleeding: Secondary | ICD-10-CM | POA: Diagnosis not present

## 2021-12-29 DIAGNOSIS — K219 Gastro-esophageal reflux disease without esophagitis: Secondary | ICD-10-CM

## 2021-12-29 DIAGNOSIS — K2980 Duodenitis without bleeding: Secondary | ICD-10-CM | POA: Diagnosis not present

## 2021-12-29 DIAGNOSIS — D132 Benign neoplasm of duodenum: Secondary | ICD-10-CM | POA: Diagnosis not present

## 2021-12-29 HISTORY — PX: UPPER GASTROINTESTINAL ENDOSCOPY: SHX188

## 2021-12-29 MED ORDER — SODIUM CHLORIDE 0.9 % IV SOLN: 500.0000 mL | Freq: Once | INTRAVENOUS | Status: DC

## 2021-12-29 MED ORDER — OMEPRAZOLE 40 MG PO CPDR: 40.0000 mg | DELAYED_RELEASE_CAPSULE | Freq: Two times a day (BID) | ORAL | 2 refills | Status: DC

## 2021-12-29 NOTE — Patient Instructions (Addendum)
Handout on gastritis and GERD and provided  ? ?Await pathology results.  ? ?Continue current medications. Start Prilosec 40 mg twice a day for 8 weeks  ? ?YOU HAD AN ENDOSCOPIC PROCEDURE TODAY AT [facility]:   Refer to the procedure report that was given to you for any specific questions about what was found during the examination.  If the procedure report does not answer your questions, please call your gastroenterologist to clarify.  If you requested that your care partner not be given the details of your procedure findings, then the procedure report has been included in a sealed envelope for you to review at your convenience later. ? ?YOU SHOULD EXPECT: Some feelings of bloating in the abdomen. Passage of more gas than usual.  Walking can help get rid of the air that was put into your GI tract during the procedure and reduce the bloating. If you had a lower endoscopy (such as a colonoscopy or flexible sigmoidoscopy) you may notice spotting of blood in your stool or on the toilet paper. If you underwent a bowel prep for your procedure, you may not have a normal bowel movement for a few days. ? ?Please Note:  You might notice some irritation and congestion in your nose or some drainage.  This is from the oxygen used during your procedure.  There is no need for concern and it should clear up in a day or so. ? ?SYMPTOMS TO REPORT IMMEDIATELY: ?Following upper endoscopy (EGD) ? Vomiting of blood or coffee ground material ? New chest pain or pain under the shoulder blades ? Painful or persistently difficult swallowing ? New shortness of breath ? Fever of 100?F or higher ? Black, tarry-looking stools ? ?For urgent or emergent issues, a gastroenterologist can be reached at any hour by calling [phone]. ?Do not use MyChart messaging for urgent concerns.  ? ? ?DIET:  We do recommend a small meal at first, but then you may proceed to your regular diet.  Drink plenty of fluids but you should avoid  alcoholic beverages for 24 hours. ? ?ACTIVITY:  You should plan to take it easy for the rest of today and you should NOT DRIVE or use heavy machinery until tomorrow (because of the sedation medicines used during the test).   ? ?FOLLOW UP: ?Our staff will call the number listed on your records 48-72 hours following your procedure to check on you and address any questions or concerns that you may have regarding the information given to you following your procedure. If we do not reach you, we will leave a message.  We will attempt to reach you two times.  During this call, we will ask if you have developed any symptoms of COVID 19. If you develop any symptoms (ie: fever, flu-like symptoms, shortness of breath, cough etc.) before then, please call [phone].  If you test positive for Covid 19 in the 2 weeks post procedure, please call and report this information to [place].   ? ?If any biopsies were taken you will be contacted by phone or by letter within the next 1-3 weeks.  Please call us at [phone] if you have not heard about the biopsies in 3 weeks.  ? ? ?SIGNATURES/CONFIDENTIALITY: ?You and/or your care partner have signed paperwork which will be entered into your electronic medical record.  These signatures attest to the fact that that the information above on your After Visit Summary has been reviewed and is understood.  Full responsibility  of the confidentiality of this discharge information lies with you and/or your care-partner. ? ? ? ? ?

## 2021-12-29 NOTE — Op Note (Addendum)
[place] ?Patient Name: Zachary [name] ?Procedure Date: 12/29/2021 10:02 AM ?MRN: [id number] ?Endoscopist: Sonny Masters "[name] ,  ?Age: 61 ?Referring MD:  ?Date of Birth: [birth year] ?Gender: Male ?Account #: [id number] ?Procedure:                Upper GI endoscopy ?Indications:              Generalized abdominal pain ?Medicines:                Monitored Anesthesia Care ?Procedure:                Pre-Anesthesia Assessment: ?                          - Prior to the procedure, a History and Physical  ?                          was performed, and patient medications and  ?                          allergies were reviewed. The patient's tolerance of  ?                          previous anesthesia was also reviewed. The risks  ?                          and benefits of the procedure and the sedation  ?                          options and risks were discussed with the patient.  ?                          All questions were answered, and informed consent  ?                          was obtained. Prior Anticoagulants: The patient has  ?                          taken no previous anticoagulant or antiplatelet  ?                          agents. ASA Grade Assessment: III - A patient with  ?                          severe systemic disease. After reviewing the risks  ?                          and benefits, the patient was deemed in  ?                          satisfactory condition to undergo the procedure. ?                          After obtaining informed consent, the endoscope was  ?  passed under direct vision. Throughout the  ?                          procedure, the patient's blood pressure, pulse, and  ?                          oxygen saturations were monitored continuously. The  ?                          Endoscope was introduced through the mouth, and  ?                          advanced to the second part of duodenum. The upper  ?                          GI endoscopy was  accomplished without difficulty.  ?                          The patient tolerated the procedure well. ?Scope In: ?Scope Out: ?Findings:                 Scattered islands of salmon-colored mucosa were  ?                          present. The maximum longitudinal extent of these  ?                          esophageal mucosal changes was 1 cm in length.  ?                          Mucosa was biopsied with a cold forceps for  ?                          histology. One specimen bottle was sent to  ?                          pathology. ?                          Localized mild inflammation characterized by  ?                          congestion (edema), erosions and erythema was found  ?                          in the gastric antrum. Biopsies were taken with a  ?                          cold forceps for histology. ?                          A single 12 mm sessile polyp with no bleeding was  ?                          found in  the second portion of the duodenum.  ?                          Biopsies were taken with a cold forceps for  ?                          histology. ?                          Biopsies were taken with a cold forceps in the  ?                          duodenal bulb and in the second portion of the  ?                          duodenum for histology. ?Complications:            No immediate complications. ?Estimated Blood Loss:     Estimated blood loss was minimal. ?Impression:               - Salmon-colored mucosa suspicious for Barrett's  ?                          esophagus. Biopsied. ?                          - Gastritis. Biopsied. ?                          - A single duodenal polyp. Biopsied. ?                          - Biopsies were taken with a cold forceps for  ?                          histology in the duodenal bulb and in the second  ?                          portion of the duodenum. ?Recommendation:           - Discharge patient to home (with escort). ?                          - Await  pathology results. ?                          - Use Prilosec (omeprazole) 40 mg PO BID for 8  ?                          weeks. ?                          - Return to GI clinic in 2 months. ?                          - The findings and recommendations were discussed  ?  with the patient. ?Georgian Co,  ?12/29/2021 10:31:00 AM ?

## 2021-12-29 NOTE — Progress Notes (Signed)
Report to PACU, RN, vss, BBS= Clear.  

## 2021-12-29 NOTE — Progress Notes (Signed)
Called to room to assist during endoscopic procedure.  Patient ID and intended procedure confirmed with present staff. Received instructions for my participation in the procedure from the performing physician.  

## 2021-12-29 NOTE — Progress Notes (Signed)
? ?GASTROENTEROLOGY PROCEDURE H&P NOTE  ? ?Primary Care Physician: ?Elsie Stain, MD ? ? ? ?Reason for Procedure:   Abdominal pain, GERD ? ?Plan:    EGD ? ?Patient is appropriate for endoscopic procedure(s) in the ambulatory ([place]) setting. ? ?The nature of the procedure, as well as the risks, benefits, and alternatives were carefully and thoroughly reviewed with the patient. Ample time for discussion and questions allowed. The patient understood, was satisfied, and agreed to proceed.  ? ? ? ?HPI: ?Zachary [name] is a 61 y.o. male who presents for EGD for evaluation of abdominal pain and GERD .  Patient was most recently seen in the Gastroenterology Clinic on 12/23/21.  No interval change in medical history since that appointment. Please refer to that note for full details regarding GI history and clinical presentation.  ? ?Past Medical History:  ?Diagnosis Date  ? Asthma   ? COVID-19 virus infection 09/15/2020  ? GERD (gastroesophageal reflux disease)   ? Knee pain   ? ? ?Past Surgical History:  ?Procedure Laterality Date  ? KNEE ARTHROSCOPY    ? x 2  ? ? ?Prior to Admission medications   ?Medication Sig Start Date End Date Taking? Authorizing Provider  ?clotrimazole (LOTRIMIN) 1 % cream Apply to affected area 2 times daily 12/12/21  Yes Deno Etienne, DO  ?omeprazole (PRILOSEC) 20 MG capsule Take 1 capsule (20 mg total) by mouth 2 (two) times daily before a meal. 12/27/21  Yes Elsie Stain, MD  ?sertraline (ZOLOFT) 50 MG tablet Take 1 tablet (50 mg total) by mouth daily. 12/27/21  Yes Elsie Stain, MD  ?sucralfate (CARAFATE) 1 g tablet Take 1 tablet (1 g total) by mouth 4 (four) times daily -  with meals and at bedtime. 12/24/21  Yes Tegeler, Gwenyth Allegra, MD  ?albuterol (VENTOLIN HFA) 108 (90 Base) MCG/ACT inhaler Inhale 2 puffs into the lungs every 6 (six) hours as needed for wheezing or shortness of breath (cough). ?Patient taking differently: Inhale 2 puffs into the lungs 2 (two) times daily as  needed for wheezing or shortness of breath (cough). 09/15/20   Elsie Stain, MD  ?amLODipine (NORVASC) 5 MG tablet Take 1 tablet (5 mg total) by mouth daily. ?Patient not taking: Reported on 12/29/2021 12/27/21   Elsie Stain, MD  ?cetirizine (ZYRTEC) 10 MG tablet TAKE 1 TABLET (10 MG TOTAL) BY MOUTH DAILY. ?Patient taking differently: Take by mouth daily as needed for allergies. 11/15/20 12/19/21  Elsie Stain, MD  ?fluticasone (FLONASE) 50 MCG/ACT nasal spray PLACE 2 SPRAYS INTO BOTH NOSTRILS DAILY. ?Patient not taking: Reported on 12/29/2021 03/17/21 03/17/22  Elsie Stain, MD  ? ? ?Current Outpatient Medications  ?Medication Sig Dispense Refill  ? clotrimazole (LOTRIMIN) 1 % cream Apply to affected area 2 times daily 15 g 0  ? omeprazole (PRILOSEC) 20 MG capsule Take 1 capsule (20 mg total) by mouth 2 (two) times daily before a meal. 120 capsule 3  ? sertraline (ZOLOFT) 50 MG tablet Take 1 tablet (50 mg total) by mouth daily. 30 tablet 3  ? sucralfate (CARAFATE) 1 g tablet Take 1 tablet (1 g total) by mouth 4 (four) times daily -  with meals and at bedtime. 30 tablet 0  ? albuterol (VENTOLIN HFA) 108 (90 Base) MCG/ACT inhaler Inhale 2 puffs into the lungs every 6 (six) hours as needed for wheezing or shortness of breath (cough). (Patient taking differently: Inhale 2 puffs into the lungs 2 (two) times daily  as needed for wheezing or shortness of breath (cough).) 18 g 0  ? amLODipine (NORVASC) 5 MG tablet Take 1 tablet (5 mg total) by mouth daily. (Patient not taking: Reported on 12/29/2021) 90 tablet 2  ? cetirizine (ZYRTEC) 10 MG tablet TAKE 1 TABLET (10 MG TOTAL) BY MOUTH DAILY. (Patient taking differently: Take by mouth daily as needed for allergies.) 30 tablet 11  ? fluticasone (FLONASE) 50 MCG/ACT nasal spray PLACE 2 SPRAYS INTO BOTH NOSTRILS DAILY. (Patient not taking: Reported on 12/29/2021) 16 g 6  ? ?Current Facility-Administered Medications  ?Medication Dose Route Frequency Provider Last Rate Last  Admin  ? 0.9 %  sodium chloride infusion  500 mL Intravenous Once Sharyn Creamer, MD      ? ? ?Allergies as of 12/29/2021  ? (No Known Allergies)  ? ? ?Family History  ?Problem Relation Age of Onset  ? Hypertension Mother   ? Cancer Father   ? Cancer Other   ? ? ?Social History  ? ?Socioeconomic History  ? Marital status: Single  ?  Spouse name: Not on file  ? Number of children: Not on file  ? Years of education: Not on file  ? Highest education level: Not on file  ?Occupational History  ? Not on file  ?Tobacco Use  ? Smoking status: Former  ? Smokeless tobacco: Never  ?Vaping Use  ? Vaping Use: Never used  ?Substance and Sexual Activity  ? Alcohol use: No  ? Drug use: No  ? Sexual activity: Yes  ?Other Topics Concern  ? Not on file  ?Social History Narrative  ? Not on file  ? ?Social Determinants of Health  ? ?Financial Resource Strain: Not on file  ?Food Insecurity: Not on file  ?Transportation Needs: Not on file  ?Physical Activity: Not on file  ?Stress: Not on file  ?Social Connections: Not on file  ?Intimate Partner Violence: Not on file  ? ? ?Physical Exam: ?Vital signs in last 24 hours: ?BP 129/73   Pulse 67   Temp 98.2 ?F (36.8 ?C)   Ht '5\' 6"'$  (1.676 m)   Wt 145 lb (65.8 kg)   SpO2 99%   BMI 23.40 kg/m?  ?GEN: NAD ?EYE: Sclerae anicteric ?ENT: MMM ?CV: Non-tachycardic ?Pulm: No increased WOB ?GI: Soft ?NEURO:  Alert & Oriented ? ? ?[name], MD ?[facility] ? ? ?12/29/2021 10:03 AM ? ?

## 2022-01-02 ENCOUNTER — Telehealth: Payer: Self-pay

## 2022-01-02 NOTE — Telephone Encounter (Signed)
?  Follow up Call- ? ? ?  12/29/2021  ?  9:40 AM  ?Call back number  ?Post procedure Call Back phone  # [phone]  ?Permission to leave phone message Yes  ?  ? ?Patient questions: ? ?Do you have a fever, pain , or abdominal swelling? No. ?Pain Score  0 * ? ?Have you tolerated food without any problems? Yes.   ? ?Have you been able to return to your normal activities? Yes.   ? ?Do you have any questions about your discharge instructions: ?Diet   No. ?Medications  No. ?Follow up visit  No. ? ?Do you have questions or concerns about your Care? Pt. Asked when he would get his lab results.  Told pt. About 2 weeks after his procedure  ? ?Actions: ?* If pain score is 4 or above: ?No action needed, pain <4. ? ? ?

## 2022-01-05 ENCOUNTER — Encounter: Payer: Self-pay | Admitting: Internal Medicine

## 2022-01-09 ENCOUNTER — Other Ambulatory Visit: Payer: Self-pay

## 2022-01-09 DIAGNOSIS — D132 Benign neoplasm of duodenum: Secondary | ICD-10-CM

## 2022-01-09 NOTE — Telephone Encounter (Signed)
Patient called back. Per patient, feels okay, stomach is still bothering him but okay overall. ?

## 2022-01-19 ENCOUNTER — Ambulatory Visit (HOSPITAL_COMMUNITY): Payer: 59

## 2022-01-23 ENCOUNTER — Ambulatory Visit: Payer: Self-pay | Admitting: Critical Care Medicine

## 2022-01-25 ENCOUNTER — Encounter (HOSPITAL_COMMUNITY): Payer: Self-pay

## 2022-01-25 ENCOUNTER — Ambulatory Visit (HOSPITAL_COMMUNITY)
Admission: RE | Admit: 2022-01-25 | Discharge: 2022-01-25 | Disposition: A | Discharge status: Home / Self Care | Payer: 59 | Source: Ambulatory Visit | Attending: Gastroenterology | Admitting: Gastroenterology

## 2022-01-25 ENCOUNTER — Other Ambulatory Visit: Payer: Self-pay | Admitting: Gastroenterology

## 2022-01-25 ENCOUNTER — Telehealth: Payer: Self-pay | Admitting: Gastroenterology

## 2022-01-25 DIAGNOSIS — D132 Benign neoplasm of duodenum: Secondary | ICD-10-CM

## 2022-01-25 MED ORDER — LORAZEPAM 1 MG PO TABS: ORAL_TABLET | ORAL | 0 refills | Status: DC

## 2022-01-25 NOTE — Telephone Encounter (Signed)
Okay to send Rx for Ativan 1 mg X2 tablets.   no refills.  Advised him to take 1 tablet an hour before and 1 tablet soon before going in the MRI.  He should have someone drive him to the procedure ?

## 2022-01-25 NOTE — Telephone Encounter (Signed)
Spoke with pt and he is aware. Script sent to pharmacy. Rad scheduling to contact pt to set up MRI now. ?

## 2022-01-25 NOTE — Telephone Encounter (Signed)
Zachary [name] Pt went for MRI today and is claustrophobic, he could not have scan done. Pt is requesting something be called in for him so he can have the scan. As DOD please advise. ?

## 2022-01-25 NOTE — Telephone Encounter (Signed)
Patient called stating he was scheduled to have a MRI today at [facility]. Patient stated he was claustrophobic and  they would not give him any medication to help. Patient was advised to call our office to get medication so patient can be rescheduled. Please advise.  ?

## 2022-01-27 ENCOUNTER — Encounter: Payer: Self-pay | Admitting: Internal Medicine

## 2022-02-07 ENCOUNTER — Encounter: Payer: 59 | Admitting: Internal Medicine

## 2022-02-07 ENCOUNTER — Telehealth: Payer: Self-pay | Admitting: Internal Medicine

## 2022-02-07 NOTE — Telephone Encounter (Signed)
Rescheduled the patient for his procedure in June and Scheduled him a PV as well. Pt has multiple questions about his prep.

## 2022-02-07 NOTE — Telephone Encounter (Signed)
Good Morning Dr. Lorenso Courier,  I called this patient this morning to see if he was coming for his procedure.  He stated the prep did not work he called and left a message with after hours department.    He said he would call back to reschedule and find out about his prep.

## 2022-02-14 ENCOUNTER — Ambulatory Visit (AMBULATORY_SURGERY_CENTER): Payer: 59 | Admitting: *Deleted

## 2022-02-14 VITALS — Ht 66.0 in | Wt 143.0 lb

## 2022-02-14 DIAGNOSIS — Z800 Family history of malignant neoplasm of digestive organs: Secondary | ICD-10-CM

## 2022-02-14 DIAGNOSIS — Z1211 Encounter for screening for malignant neoplasm of colon: Secondary | ICD-10-CM

## 2022-02-14 MED ORDER — PEG 3350-KCL-NA BICARB-NACL 420 G PO SOLR: 4000.0000 mL | Freq: Once | ORAL | 0 refills | Status: AC

## 2022-02-14 NOTE — Progress Notes (Signed)
Patient's pre-visit was done today over the phone with the patient. Name,DOB and address verified. Patient denies any allergies to Eggs and Soy. Patient denies any problems with anesthesia/sedation. Patient is not taking any diet pills or blood thinners. No home Oxygen. Insurance confirmed with patient.  Prep instructions sent to pt's MyChart and copy on 2nd floor desk for pt to pick up tomorrow-pt is aware. Patient understands to call us back with any questions or concerns. Patient is aware of our care-partner policy.   EMMI education assigned to the patient for the procedure, sent to [place].

## 2022-02-17 ENCOUNTER — Ambulatory Visit (HOSPITAL_COMMUNITY): Admission: RE | Admit: 2022-02-17 | Payer: 59 | Source: Ambulatory Visit

## 2022-02-17 ENCOUNTER — Encounter (HOSPITAL_COMMUNITY): Payer: Self-pay

## 2022-02-17 NOTE — Telephone Encounter (Signed)
Dr Silverio Decamp will return to the office 02/22/22.

## 2022-02-17 NOTE — Telephone Encounter (Signed)
Patients daughter called to reschedule the MRI because the patient was not able to complete the exam even with the medication given. She is asking if he can be sedated in order to have it done.

## 2022-02-21 NOTE — Telephone Encounter (Signed)
I think this is Dr. Libby Maw patient, I was helping as Doc of the day on one of the previous messages.  Can consider open MRI or MRI with sedation.  Thank you

## 2022-02-21 NOTE — Telephone Encounter (Signed)
My mistake. I will direct this to Dr Rush Landmark, who was the original order MD.

## 2022-02-22 ENCOUNTER — Other Ambulatory Visit: Payer: Self-pay

## 2022-02-22 ENCOUNTER — Encounter: Payer: Self-pay | Admitting: Internal Medicine

## 2022-02-22 DIAGNOSIS — D132 Benign neoplasm of duodenum: Secondary | ICD-10-CM

## 2022-02-22 NOTE — Telephone Encounter (Signed)
Imaging ordered. Radiology Scheduling aware.

## 2022-02-23 ENCOUNTER — Ambulatory Visit (AMBULATORY_SURGERY_CENTER): Payer: 59 | Admitting: Internal Medicine

## 2022-02-23 ENCOUNTER — Encounter: Payer: Self-pay | Admitting: Internal Medicine

## 2022-02-23 ENCOUNTER — Other Ambulatory Visit: Payer: Self-pay

## 2022-02-23 VITALS — BP 122/76 | HR 54 | Temp 98.3°F | Resp 16 | Ht 66.0 in | Wt 143.0 lb

## 2022-02-23 DIAGNOSIS — D123 Benign neoplasm of transverse colon: Secondary | ICD-10-CM

## 2022-02-23 DIAGNOSIS — D125 Benign neoplasm of sigmoid colon: Secondary | ICD-10-CM | POA: Diagnosis not present

## 2022-02-23 DIAGNOSIS — Z800 Family history of malignant neoplasm of digestive organs: Secondary | ICD-10-CM | POA: Diagnosis not present

## 2022-02-23 DIAGNOSIS — D124 Benign neoplasm of descending colon: Secondary | ICD-10-CM

## 2022-02-23 DIAGNOSIS — Z1211 Encounter for screening for malignant neoplasm of colon: Secondary | ICD-10-CM

## 2022-02-23 MED ORDER — SODIUM CHLORIDE 0.9 % IV SOLN: 500.0000 mL | Freq: Once | INTRAVENOUS | Status: DC

## 2022-02-23 NOTE — Progress Notes (Signed)
To pacu, VSS. Report to Rn.tb 

## 2022-02-23 NOTE — Telephone Encounter (Signed)
Just saw this. Apologies. Agree with attempt at open MRI and if not then with sedation if possible because it would give [place] the most information before his upcoming clinic visit.

## 2022-02-23 NOTE — Telephone Encounter (Signed)
Yes.  Please use a 350PM slot or a 230PM slot if necessary.  Thanks. GM

## 2022-02-23 NOTE — Progress Notes (Signed)
GASTROENTEROLOGY PROCEDURE H&P NOTE   Primary Care Physician: Elsie Stain, MD    Reason for Procedure:   Colon cancer screening, family history of colon cancer   Plan:    Colonoscopy  Patient is appropriate for endoscopic procedure(s) in the ambulatory ([place]) setting.  The nature of the procedure, as well as the risks, benefits, and alternatives were carefully and thoroughly reviewed with the patient. Ample time for discussion and questions allowed. The patient understood, was satisfied, and agreed to proceed.     HPI: Zachary [name] is a 61 y.o. male who presents for colonoscopy for evaluation of colon cancer screening and family history of colon cancer. .  Patient was most recently seen in the Gastroenterology Clinic on 12/23/21.  No interval change in medical history since that appointment. Please refer to that note for full details regarding GI history and clinical presentation.   Past Medical History:  Diagnosis Date   Asthma    COVID-19 virus infection 09/15/2020   GERD (gastroesophageal reflux disease)    Knee pain     Past Surgical History:  Procedure Laterality Date   KNEE ARTHROSCOPY     x 2   UPPER GASTROINTESTINAL ENDOSCOPY  12/29/2021   Dr.Linda Grimmer    Prior to Admission medications   Medication Sig Start Date End Date Taking? Authorizing Provider  albuterol (VENTOLIN HFA) 108 (90 Base) MCG/ACT inhaler Inhale 2 puffs into the lungs every 6 (six) hours as needed for wheezing or shortness of breath (cough). 09/15/20   Elsie Stain, MD  clotrimazole (LOTRIMIN) 1 % cream Apply to affected area 2 times daily 12/12/21   Deno Etienne, DO  omeprazole (PRILOSEC) 40 MG capsule Take 1 capsule (40 mg total) by mouth 2 (two) times daily. For 8 weeks 12/29/21   Sharyn Creamer, MD  sertraline (ZOLOFT) 50 MG tablet Take 1 tablet (50 mg total) by mouth daily. Patient not taking: Reported on 02/14/2022 12/27/21   Elsie Stain, MD  sucralfate (CARAFATE) 1 g tablet Take 1  tablet (1 g total) by mouth 4 (four) times daily -  with meals and at bedtime. 12/24/21   Tegeler, Gwenyth Allegra, MD    Current Outpatient Medications  Medication Sig Dispense Refill   albuterol (VENTOLIN HFA) 108 (90 Base) MCG/ACT inhaler Inhale 2 puffs into the lungs every 6 (six) hours as needed for wheezing or shortness of breath (cough). 18 g 0   clotrimazole (LOTRIMIN) 1 % cream Apply to affected area 2 times daily 15 g 0   omeprazole (PRILOSEC) 40 MG capsule Take 1 capsule (40 mg total) by mouth 2 (two) times daily. For 8 weeks 30 capsule 2   sertraline (ZOLOFT) 50 MG tablet Take 1 tablet (50 mg total) by mouth daily. (Patient not taking: Reported on 02/14/2022) 30 tablet 3   sucralfate (CARAFATE) 1 g tablet Take 1 tablet (1 g total) by mouth 4 (four) times daily -  with meals and at bedtime. 30 tablet 0   Current Facility-Administered Medications  Medication Dose Route Frequency Provider Last Rate Last Admin   0.9 %  sodium chloride infusion  500 mL Intravenous Once Sharyn Creamer, MD        Allergies as of 02/23/2022   (No Known Allergies)    Family History  Problem Relation Age of Onset   Hypertension Mother    Cancer Father    Colon cancer Maternal Uncle    Cancer Other    Colon cancer Cousin  Social History   Socioeconomic History   Marital status: Single    Spouse name: Not on file   Number of children: Not on file   Years of education: Not on file   Highest education level: Not on file  Occupational History   Not on file  Tobacco Use   Smoking status: Former   Smokeless tobacco: Never  Vaping Use   Vaping Use: Never used  Substance and Sexual Activity   Alcohol use: No   Drug use: No   Sexual activity: Yes  Other Topics Concern   Not on file  Social History Narrative   Not on file   Social Determinants of Health   Financial Resource Strain: Not on file  Food Insecurity: Not on file  Transportation Needs: Not on file  Physical Activity: Not on  file  Stress: Not on file  Social Connections: Not on file  Intimate Partner Violence: Not on file    Physical Exam: Vital signs in last 24 hours: BP 123/88   Pulse 64   Temp 98.3 F (36.8 C) (Temporal)   Ht '5\' 6"'$  (1.676 m)   Wt 143 lb (64.9 kg)   SpO2 96%   BMI 23.08 kg/m  GEN: NAD EYE: Sclerae anicteric ENT: MMM CV: Non-tachycardic Pulm: No increased WOB GI: Soft NEURO:  Alert & Oriented   [name], MD [facility]   02/23/2022 3:19 PM

## 2022-02-23 NOTE — Progress Notes (Signed)
I have reviewed the patient's medical history in detail and updated the computerized patient record.   VS BY CW. 

## 2022-02-23 NOTE — Patient Instructions (Signed)
Handouts on polyps, hemorrhoids, and diverticulosis given to patient.  Await pathology results. Resume previous diet and continue present medications. Repeat colonoscopy for surveillance will be determined based off of pathology results.   YOU HAD AN ENDOSCOPIC PROCEDURE TODAY AT [facility]:   Refer to the procedure report that was given to you for any specific questions about what was found during the examination.  If the procedure report does not answer your questions, please call your gastroenterologist to clarify.  If you requested that your care partner not be given the details of your procedure findings, then the procedure report has been included in a sealed envelope for you to review at your convenience later.  YOU SHOULD EXPECT: Some feelings of bloating in the abdomen. Passage of more gas than usual.  Walking can help get rid of the air that was put into your GI tract during the procedure and reduce the bloating. If you had a lower endoscopy (such as a colonoscopy or flexible sigmoidoscopy) you may notice spotting of blood in your stool or on the toilet paper. If you underwent a bowel prep for your procedure, you may not have a normal bowel movement for a few days.  Please Note:  You might notice some irritation and congestion in your nose or some drainage.  This is from the oxygen used during your procedure.  There is no need for concern and it should clear up in a day or so.  SYMPTOMS TO REPORT IMMEDIATELY:  Following lower endoscopy (colonoscopy or flexible sigmoidoscopy):  Excessive amounts of blood in the stool  Significant tenderness or worsening of abdominal pains  Swelling of the abdomen that is new, acute  Fever of 100F or higher  For urgent or emergent issues, a gastroenterologist can be reached at any hour by calling [phone]. Do not use MyChart messaging for urgent concerns.    DIET:  We do recommend a small meal at first, but then you may  proceed to your regular diet.  Drink plenty of fluids but you should avoid alcoholic beverages for 24 hours.  ACTIVITY:  You should plan to take it easy for the rest of today and you should NOT DRIVE or use heavy machinery until tomorrow (because of the sedation medicines used during the test).    FOLLOW UP: Our staff will call the number listed on your records 24-72 hours following your procedure to check on you and address any questions or concerns that you may have regarding the information given to you following your procedure. If we do not reach you, we will leave a message.  We will attempt to reach you two times.  During this call, we will ask if you have developed any symptoms of COVID 19. If you develop any symptoms (ie: fever, flu-like symptoms, shortness of breath, cough etc.) before then, please call [phone].  If you test positive for Covid 19 in the 2 weeks post procedure, please call and report this information to [place].    If any biopsies were taken you will be contacted by phone or by letter within the next 1-3 weeks.  Please call us at [phone] if you have not heard about the biopsies in 3 weeks.    SIGNATURES/CONFIDENTIALITY: You and/or your care partner have signed paperwork which will be entered into your electronic medical record.  These signatures attest to the fact that that the information above on your After Visit Summary has been reviewed and is understood.  Full  responsibility of the confidentiality of this discharge information lies with you and/or your care-partner.  

## 2022-02-23 NOTE — Op Note (Signed)
Zachary [name]: Zachary [name] Procedure Date: 02/23/2022 3:11 PM MRN: [id number] Endoscopist: Sonny Masters "Zachary [name] ,  Age: 61 Referring MD:  Date of Birth: 07-14-1961 Gender: Male Account #: [id number] Procedure:                Colonoscopy Indications:              Screening patient at increased risk: Family history                            of 1st-degree relative with colorectal cancer at                            age 56 years (or older) Medicines:                Monitored Anesthesia Care Procedure:                Pre-Anesthesia Assessment:                           - Prior to the procedure, a History and Physical                            was performed, and patient medications and                            allergies were reviewed. The patient's tolerance of                            previous anesthesia was also reviewed. The risks                            and benefits of the procedure and the sedation                            options and risks were discussed with the patient.                            All questions were answered, and informed consent                            was obtained. Prior Anticoagulants: The patient has                            taken no previous anticoagulant or antiplatelet                            agents. ASA Grade Assessment: II - A patient with                            mild systemic disease. After reviewing the risks                            and benefits, the patient was deemed in  satisfactory condition to undergo the procedure.                           After obtaining informed consent, the colonoscope                            was passed under direct vision. Throughout the                            procedure, the patient's blood pressure, pulse, and                            oxygen saturations were monitored continuously. The                            Olympus CF-HQ190L [id number])  Colonoscope was                            introduced through the anus and advanced to the the                            terminal ileum. The colonoscopy was performed                            without difficulty. The patient tolerated the                            procedure well. The quality of the bowel                            preparation was good. The terminal ileum, ileocecal                            valve, appendiceal orifice, and rectum were                            photographed. Scope In: 3:32:46 PM Scope Out: 3:51:22 PM Scope Withdrawal Time: 0 hours 15 minutes 26 seconds  Total Procedure Duration: 0 hours 18 minutes 36 seconds  Findings:                 The terminal ileum appeared normal.                           Three sessile polyps were found in the sigmoid                            colon, descending colon and transverse colon. The                            polyps were 3 to 5 mm in size. These polyps were                            removed with a cold snare. Resection and retrieval  were complete.                           Multiple diverticula were found in the sigmoid                            colon and descending colon.                           Non-bleeding internal hemorrhoids were found during                            retroflexion. Complications:            No immediate complications. Estimated Blood Loss:     Estimated blood loss was minimal. Impression:               - The examined portion of the ileum was normal.                           - Three 3 to 5 mm polyps in the sigmoid colon, in                            the descending colon and in the transverse colon,                            removed with a cold snare. Resected and retrieved.                           - Diverticulosis in the sigmoid colon and in the                            descending colon.                           - Non-bleeding internal  hemorrhoids. Recommendation:           - Discharge patient to home (with escort).                           - Await pathology results.                           - The findings and recommendations were discussed                            with the patient. Sonny Masters "Zachary [name],  02/23/2022 3:56:39 PM

## 2022-02-23 NOTE — Progress Notes (Signed)
Called to room to assist during endoscopic procedure.  Patient ID and intended procedure confirmed with present staff. Received instructions for my participation in the procedure from the performing physician.  

## 2022-02-23 NOTE — Telephone Encounter (Signed)
Patient will have the MRI/MRCP at [facility] with general anesthesis 03/28/22. His follow up appointment is 04/04/22. Spoke with the daughter. She agrees to this plan for the patient. Patient is having a colonoscopy today in [place] with Dr Lorenso Courier.

## 2022-02-24 ENCOUNTER — Telehealth: Payer: Self-pay

## 2022-02-24 NOTE — Telephone Encounter (Signed)
  Follow up Call-     02/23/2022    3:15 PM 12/29/2021    9:40 AM  Call back number  Post procedure Call Back phone  # [phone]  Permission to leave phone message Yes Yes     Patient questions:  Do you have a fever, pain , or abdominal swelling? No. Pain Score  0 *  Have you tolerated food without any problems? Yes.    Have you been able to return to your normal activities? Yes.    Do you have any questions about your discharge instructions: Diet   No. Medications  No. Follow up visit  No.  Do you have questions or concerns about your Care? No.  Actions: * If pain score is 4 or above: No action needed, pain <4.

## 2022-02-28 ENCOUNTER — Ambulatory Visit: Payer: 59 | Admitting: Gastroenterology

## 2022-02-28 ENCOUNTER — Encounter: Payer: Self-pay | Admitting: Internal Medicine

## 2022-03-08 ENCOUNTER — Ambulatory Visit: Payer: 59 | Attending: Critical Care Medicine | Admitting: Critical Care Medicine

## 2022-03-08 ENCOUNTER — Encounter: Payer: Self-pay | Admitting: Critical Care Medicine

## 2022-03-08 VITALS — BP 127/83 | HR 71 | Ht 66.0 in | Wt 144.0 lb

## 2022-03-08 DIAGNOSIS — K4090 Unilateral inguinal hernia, without obstruction or gangrene, not specified as recurrent: Secondary | ICD-10-CM

## 2022-03-08 DIAGNOSIS — R1013 Epigastric pain: Secondary | ICD-10-CM

## 2022-03-08 DIAGNOSIS — J4520 Mild intermittent asthma, uncomplicated: Secondary | ICD-10-CM

## 2022-03-08 DIAGNOSIS — L309 Dermatitis, unspecified: Secondary | ICD-10-CM | POA: Diagnosis not present

## 2022-03-08 DIAGNOSIS — K056 Periodontal disease, unspecified: Secondary | ICD-10-CM

## 2022-03-08 MED ORDER — OMEPRAZOLE 40 MG PO CPDR
40.0000 mg | DELAYED_RELEASE_CAPSULE | Freq: Two times a day (BID) | ORAL | 2 refills | Status: DC
Start: 2022-03-08 — End: 2022-06-27

## 2022-03-08 MED ORDER — SERTRALINE HCL 50 MG PO TABS: 50.0000 mg | ORAL_TABLET | Freq: Every day | ORAL | 3 refills | Status: DC

## 2022-03-08 NOTE — Patient Instructions (Addendum)
No change in medications, refills sent to pharmacy  Keep follow up appt with GI for MRI of liver 03/28/22  Keep dental appt  No alcohol   You do not need to refill the carafate.   Stay on the omeprazole twice a day   Return Dr Joya [name] 4 months

## 2022-03-08 NOTE — Assessment & Plan Note (Signed)
Due to gastritis and duodenitis H. pylori negative continue twice daily omeprazole and discontinue Carafate

## 2022-03-08 NOTE — Assessment & Plan Note (Addendum)
Patient has upcoming dental exam to remove remaining teeth and apply dentures

## 2022-03-08 NOTE — Assessment & Plan Note (Signed)
Stable

## 2022-03-08 NOTE — Progress Notes (Signed)
Established Patient Office Visit  Subjective:  Patient ID: Zachary [name], male    DOB: 04/11/1961  Age: 61 y.o. MRN: [id number]  CC:  Emergency room follow-up  HPI 10/11/21 ATHONY [name] presents for follow up after ED visit for cough. This problem has persisted for a long time, but has been worse over the past two weeks. He last saw Dr. Joya [name] in this office on 08/23/2021 and was diagnosed with acute recurrent maxillary sinusitis which was treated with a 5 day course of azithromycin. This helped with his symptoms for awhile but the problem returned.   He saw Zachary Rankins FNP for this issue on 09/23/2021 via telemedicine visit. At that time, he was diagnosed with acute bronchitis and prescribed promethazine cough syrup. He then presented to the ED on 09/30/21 for productive cough, bloating, abdominal fullness, and reflux symptoms. ED workup revealed no additional findings. At that time, supportive care was recommended for the cough, and treatment of GERD symptoms with Protonix and Carafate.   He reports that today he is still experiencing productive cough, shortness of breath, chest tightness, and some wheezing. He still has congestion, sinus pain, rhinorrhea, and sinus pressure. His chest hurts from the amount of coughing he has been doing. He is unaware of fevers, but he has been waking up with sweating at night occasionally for the past few days. He also has headaches throughout the day. He has been feeling a lot of nausea that he thinks is related to the cough. He has used cough syrup as prescribed as well as Mucinex, Flonase, and cetirizine daily. He thinks that these help, but only for a short amount of time. This issue has left him feeling very fatigued, but otherwise his mood is fine.  12/06/21 Patient returns after having been in the emergency room recently for abdominal pain below is documentation from the ER visit In ED for Abdominal pain   I was informed by tech that patient  refused to go in scanner  [EH]  0421 Ativan had been ordered for anxiety and claustrophobia for CT scan, however patient refused this.  I spoke with patient, he refuses to try the Ativan saying that he knows it will not work and that he needs to be " knocked out" for a CAT scan on his abdomen. I explained to him that general anesthesia for a CT scan on his abdomen is not currently possible or appropriate.  He refuses to even try the Ativan to see how that makes him feel.  He will allow me to obtain a ultrasound on his abdomen. We had a extensive discussion that even with a ultrasound on his abdomen I am unable to rule out life threatening causes including those that may cause pain, worsening of condition, disability that may be severe and other complications and he states his understanding. [EH]  0531 Patient continues to refuse CT scan or to attempt to try the ativan.  I asked him if there was anything I could do to allow him to obtain the CT scan today after we discussed the purpose of the CT scan and he stated that unless I was able to fully knock him out that there was nothing I can do.  He insists that he will be back later today for the scan.  When I asked him what will change between now and then he tells me "I just need time to think about it." [EH]     Clinical Course User Index [EH] Zachary [name],  Zachary Shay, PA-C                            Medical Decision Making Patient is a 61 year old man who presents today for evaluation of abdominal pain.  On my exam his abdomen is generally tender.   Labs are obtained and reviewed, please see below. I recommended a CT abdomen pelvis with contrast.  Patient refused this. I gave him pain meds, nausea meds.  I recommended a dose of Ativan as he reports he is claustrophobic.  He did attempt to go to CT scan once however once the bed began moving he refused to continue. Patient refused to try Ativan.  He told me multiple times that it would not work despite  never having it before. Please see ED course for further details. Ultimately patient continued to refuse to try anxiolytics or CT scan. We discussed risks of this and that he is making this decision AGAINST MEDICAL ADVICE.  We discussed that I am unable to rule out life-threatening or other serious potential causes of his symptoms and abdominal pain.  He states his understanding of this.  There is no family at bedside for me to try to have convince patient to stay.   He states that there is nothing I can do to allow him to have the CT scan short of general anesthetic which I informed him is not something I am able to provide and do not think it is appropriate especially as he will not even try anxiolytics.   I have attempted to treat and evaluate patient with in the limits of what he will allow.    Patient will sign out AMA.    Amount and/or Complexity of Data Reviewed Labs: ordered.    Details: CBC and CMP are unremarkable.  Lipase is minimally elevated at 56.  Troponin x2 is not elevated. Radiology: ordered.    Details: I ordered a CT abdomen pelvis with contrast that patient refused, please see ED course.  Chest x-ray without consolidation pneumothorax or other abnormalities.  Abdominal ultrasound obtained without cause for his symptoms found. ECG/medicine tests: ordered.   Risk Prescription drug management. Parenteral controlled substances. Diagnosis or treatment significantly limited by social determinants of health. Risk Details: Patient refused imaging, left AMA.    Note the patient left AGAINST MEDICAL ADVICE his labs showed elevated lipase and ultrasound of the abdomen did not show specific cause of his symptoms.  He had significant epigastric abdominal pain with nausea and occasional emesis.  He would regurgitate some bile colored material.  He does not drink alcohol is not currently smoking cigarettes.  The abdominal pain has subsided somewhat with the use of pantoprazole.   Unfortunately his insurance would only pay for once daily pantoprazole.  He denies constipation or diarrhea.  There is no blood in the stool no blood in the emesis.  Patient comes to the office today after having been on the pantoprazole for further follow-up and evaluations.  Note his prior visit in January revolved around acute maxillary sinusitis recurrent which now has resolved.  12/27/2021 Mult ED visits since last OV This patient since last being seen end of March has been to the emergency room 5 times for variety of issues.  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  He then had visual blurring found  to have elevated blood pressure with this.  He had a negative cardiac work-up that he had abdominal pain and chest pain ER visits.  He ultimately had a complete CT scan of the chest and abdomen which was negative.  He had a extensive laboratory work-up that was unremarkable.  He has also seen gastroenterology below is documentation from that visit.  61 year old male with history of asthma and knee pain presents with abdominal pain   He started experiencing abdominal pain a couple of months ago. When he went to his doctor, he was told that he had ulcers. This abdominal pain has gotten worse over time.  He was trying to take pantoprazole 40 mg QD, but he could not tolerate the medication because it was causing him to feel overheated. The PPI did help a little with his pain, but he did not feel right. He has been prescribed Bentyl in the past which has also not helped. He has been using Pepto Bismol, which has been helping with the abdominal pain.  He has been experiencing burning in his chest. Feels really full. Used to be heavy drinker when he was younger, but he stopped drinking alcohol 30 years ago. Former smoker. Denies diarrhea or constipation. Saw a little bit of bleeding in the  stool recently. He has been doing stool tests for colon cancer screening, which have been negative. Denies dysphagia or N&V. Father and grandfather had colon cancer. Father had colon cancer in his 57s. He has never had an EGD or colonoscopy in the past.  Patient does have issues with headaches and has been taking ibuprofen to help with this.  He has been experiencing some vision changes recently as well as numbness and the left side of his face and left arm.      Wt Readings from Last 3 Encounters:  12/23/21 145 lb (65.8 kg)  12/12/21 150 lb (68 kg)  12/06/21 147 lb (66.7 kg)  ASSESSMENT AND PLAN: Diffuse abdominal pain GERD Loss of appetite Colon cancer screening Family history of colon cancer Patient presents with abdominal pain that started a few months ago.  Unclear etiology at this time.  Could potentially consider contribution from PUD since the patient endorses NSAID use.  I will plan to start the patient on omeprazole 20 mg daily.  I will also go ahead and obtain CT scan and EGD for further evaluation.  Patient has never had a colonoscopy for colon cancer screening in the past despite his family history of colon cancer.  Thus we will plan to proceed with colonoscopy for colon cancer screening.  I went over the risks and benefits of EGD and colonoscopy with the patient in detail, and he is agreeable to proceeding. - GERD friendly  - Start omeprazole 20 mg QD - Reduce NSAID use. Okay to use Tylenol PRN. - CT A/P w/contrast - EGD/colonoscopy [facility]   The endoscopy was to be coupled with a colonoscopy and is not scheduled for May 29.  Patient comes to the office today accompanied by his granddaughter [place].  Blood pressure on arrival 143/91.  Still having quite a bit of chest pain.  He has been quite anxious and nervous.  He tends to overthink his symptoms and overthink side effects.  He stopped some of the medications he should be taking for his acid reflux.  He is yet to pick up the Carafate  that was given to him from the emergency room on April 15.  6/28 Patient seen in  return follow-up Patient's abdominal pain markedly improved patient's been to gastroenterology was found to have gastritis Barrett's esophagitis duodenal polyps gastric polyps all of which were benign he had a colonoscopy which showed tubular adenomas which were removed.  Patient is on twice daily omeprazole 40 mg.  Patient states he is having less vomiting and nausea or vomiting.  Patient needs dental examination this is upcoming  Patient's mood and affect are stable on sertraline  There is no other complaints     Past Medical History:  Diagnosis Date   Asthma    COVID-19 virus infection 09/15/2020   GERD (gastroesophageal reflux disease)    Knee pain     Past Surgical History:  Procedure Laterality Date   KNEE ARTHROSCOPY     x 2   UPPER GASTROINTESTINAL ENDOSCOPY  12/29/2021   Dr.Ying    Family History  Problem Relation Age of Onset   Hypertension Mother    Cancer Father    Colon cancer Maternal Uncle    Cancer Other    Colon cancer Cousin     Social History   Socioeconomic History   Marital status: Single    Spouse name: Not on file   Number of children: Not on file   Years of education: Not on file   Highest education level: Not on file  Occupational History   Not on file  Tobacco Use   Smoking status: Former   Smokeless tobacco: Never  Vaping Use   Vaping Use: Never used  Substance and Sexual Activity   Alcohol use: No   Drug use: No   Sexual activity: Yes  Other Topics Concern   Not on file  Social History Narrative   Not on file   Social Determinants of Health   Financial Resource Strain: Not on file  Food Insecurity: Not on file  Transportation Needs: Not on file  Physical Activity: Not on file  Stress: Not on file  Social Connections: Not on file  Intimate Partner Violence: Not on file    Outpatient Medications Prior to Visit  Medication Sig Dispense  Refill   albuterol (VENTOLIN HFA) 108 (90 Base) MCG/ACT inhaler Inhale 2 puffs into the lungs every 6 (six) hours as needed for wheezing or shortness of breath (cough). 18 g 0   sucralfate (CARAFATE) 1 g tablet Take 1 tablet (1 g total) by mouth 4 (four) times daily -  with meals and at bedtime. 30 tablet 0   clotrimazole (LOTRIMIN) 1 % cream Apply to affected area 2 times daily 15 g 0   omeprazole (PRILOSEC) 40 MG capsule Take 1 capsule (40 mg total) by mouth 2 (two) times daily. For 8 weeks 30 capsule 2   sertraline (ZOLOFT) 50 MG tablet Take 1 tablet (50 mg total) by mouth daily. 30 tablet 3   No facility-administered medications prior to visit.    No Known Allergies  ROS Review of Systems  Constitutional: Negative.  Negative for diaphoresis and fever.  HENT: Negative.  Negative for congestion, ear pain, postnasal drip, rhinorrhea, sinus pressure, sinus pain, sore throat, trouble swallowing and voice change.   Eyes: Negative.  Negative for visual disturbance.  Respiratory: Negative.  Negative for apnea, cough, choking, chest tightness, shortness of breath, wheezing and stridor.   Cardiovascular: Negative.  Negative for chest pain, palpitations and leg swelling.  Gastrointestinal: Negative.  Negative for abdominal distention, abdominal pain, anal bleeding, blood in stool, constipation, diarrhea, nausea, rectal pain and vomiting.  Endocrine: Negative.  Genitourinary: Negative.   Musculoskeletal: Negative.  Negative for arthralgias and myalgias.  Skin: Negative.  Negative for rash.  Allergic/Immunologic: Negative.  Negative for environmental allergies and food allergies.  Neurological: Negative.  Negative for dizziness, syncope, weakness and headaches.  Hematological: Negative.  Negative for adenopathy. Does not bruise/bleed easily.  Psychiatric/Behavioral: Negative.  Negative for agitation, dysphoric mood and sleep disturbance. The patient is not nervous/anxious.       Objective:     Physical Exam Vitals reviewed.  Constitutional:      General: He is not in acute distress.    Appearance: Normal appearance. He is well-developed and normal weight. He is not diaphoretic.  HENT:     Head: Normocephalic and atraumatic.     Right Ear: Tympanic membrane, ear canal and external ear normal. Impacted cerumen: non-impacted cerumen present.     Left Ear: Tympanic membrane, ear canal and external ear normal. Impacted cerumen: non-impacted cerumen present.     Nose: No nasal deformity, septal deviation, mucosal edema or rhinorrhea.     Right Turbinates: Swollen (pus visualized).     Left Turbinates: Swollen.     Right Sinus: Frontal sinus tenderness present. No maxillary sinus tenderness.     Left Sinus: Frontal sinus tenderness present. No maxillary sinus tenderness.     Mouth/Throat:     Mouth: Mucous membranes are moist.     Dentition: Abnormal dentition.     Pharynx: No oropharyngeal exudate (significant postnasal drip visualized) or posterior oropharyngeal erythema.     Tonsils: No tonsillar exudate.     Comments: Poor dentition Eyes:     General: No scleral icterus.    Conjunctiva/sclera: Conjunctivae normal.     Pupils: Pupils are equal, round, and reactive to light.  Neck:     Thyroid: No thyromegaly.     Vascular: No carotid bruit or JVD.     Trachea: Trachea normal. No tracheal tenderness or tracheal deviation.  Cardiovascular:     Rate and Rhythm: Normal rate and regular rhythm.     Chest Wall: PMI is not displaced.     Pulses: Normal pulses. No decreased pulses.     Heart sounds: Normal heart sounds, S1 normal and S2 normal. Heart sounds not distant. No murmur heard.    No systolic murmur is present.     No diastolic murmur is present.     No friction rub. No gallop. No S3 or S4 sounds.  Pulmonary:     Effort: Pulmonary effort is normal. No tachypnea, accessory muscle usage or respiratory distress.     Breath sounds: Normal breath sounds. No stridor. No  decreased breath sounds, wheezing, rhonchi or rales.  Chest:     Chest wall: No tenderness.  Abdominal:     General: Bowel sounds are normal. There is no distension.     Palpations: Abdomen is soft. Abdomen is not rigid. There is no mass.     Tenderness: There is no abdominal tenderness. There is no right CVA tenderness, left CVA tenderness, guarding or rebound.     Hernia: No hernia is present.     Comments: Abdominal pain resolved  Musculoskeletal:        General: Normal range of motion.     Cervical back: Normal range of motion and neck supple. No edema, erythema, rigidity or tenderness. No muscular tenderness. Normal range of motion.  Lymphadenopathy:     Head:     Right side of head: No submental or submandibular adenopathy.  Left side of head: No submental or submandibular adenopathy.     Cervical: No cervical adenopathy.  Skin:    General: Skin is warm and dry.     Coloration: Skin is not pale.     Findings: No rash.     Nails: There is no clubbing.  Neurological:     General: No focal deficit present.     Mental Status: He is alert and oriented to person, place, and time. Mental status is at baseline.     Sensory: No sensory deficit.  Psychiatric:        Mood and Affect: Mood normal.        Speech: Speech normal.        Behavior: Behavior normal.        Thought Content: Thought content normal.        Judgment: Judgment normal.     BP 127/83   Pulse 71   Ht '5\' 6"'$  (1.676 m)   Wt 144 lb (65.3 kg)   SpO2 97%   BMI 23.24 kg/m  Wt Readings from Last 3 Encounters:  03/08/22 144 lb (65.3 kg)  02/23/22 143 lb (64.9 kg)  02/14/22 143 lb (64.9 kg)  DG Chest 2 View   Result Date: 12/01/2021 CLINICAL DATA:  Chest pain EXAM: CHEST - 2 VIEW COMPARISON:  09/30/2021 FINDINGS: The heart size and mediastinal contours are within normal limits. Both lungs are clear. The visualized skeletal structures are unremarkable. IMPRESSION: No active cardiopulmonary disease. Electronically  Signed   By: Ulyses Jarred M.D.   On: 12/01/2021 02:22    US Abdomen Complete   Result Date: 12/01/2021 CLINICAL DATA:  61 year old male with history of abdominal pain. EXAM: ABDOMEN ULTRASOUND COMPLETE COMPARISON:  No priors. FINDINGS: Gallbladder: No gallstones or wall thickening visualized. No sonographic Murphy sign noted by sonographer. Common bile duct: Diameter: 3.3 mm Liver: No focal lesion identified. Within normal limits in parenchymal echogenicity. Portal vein is patent on color Doppler imaging with normal direction of blood flow towards the liver. IVC: No abnormality visualized. Pancreas: Visualized portion unremarkable. Spleen: Size and appearance within normal limits. Right Kidney: Length: 10.5 cm. Echogenicity within normal limits. No mass or hydronephrosis visualized. Left Kidney: Length: 9.8 cm. Echogenicity within normal limits. No mass or hydronephrosis visualized. Abdominal aorta: No aneurysm visualized. Other findings: None. IMPRESSION: 1. No acute abnormality to account for the patient's symptoms. Normal abdominal ultrasound. Electronically Signed   By: Vinnie Langton M.D.   On: 12/01/2021 05:17    CT angio chest abd 4/15: IMPRESSION: Vascular:   1. No findings of aortic dissection or aneurysm.   Nonvascular:   1. No acute findings in the chest. 2. No acute findings in the abdomen or pelvis.   ED 12/24/21 JAYVEION STALLING is a 61 y.o. male with a past medical history significant for GERD, asthma, previous inguinal hernia, and periodontal disease who presents for worsening pain in his chest and back.  According to patient, over the last few weeks he has had many symptoms including transient waxing waning blurry vision for which she has not gotten glasses, abdominal pain for which she has seen GI who recommended CT abdomen pelvis as an outpatient and new reflux medication, and then since  last night developed pain across his chest going through to his back and down his left  arm.  He reports the pain is a pressure and burning pain.  His blood pressure was elevated on arrival in the 150s and he reports  his blood pressure has been up and down at times.  He reports no current nausea and vomiting but does history of gastric ulcers.  He reports no constipation or diarrhea but reports he is having some dysuria.  He denies fevers, chills, congestion, or cough.  Denies any shortness of breath currently but told triage that he was having short of breath with exertion.  Denies new leg pain or leg swelling.  Denies any injuries.  Reports his discomfort in his chest is a 7 out of 10 in severity.  He told triage that has been up to a 9 out of 10 in severity.  On exam, lungs are clear and chest is nontender.  I cannot reproduce his discomfort.  Abdomen was tender in the epigastric area.  Bowel sounds were appreciated and I did have symmetric pulses in upper and lower extremities.  Intact sensation and strength throughout.  Back was nontender.  Lungs were clear.  I did not appreciate a murmur.  Pupils are symmetric and reactive with normal extraocular movements.  Patient reports she has not yet used to his glasses.  Clinically I suspect patient has a esophageal reflux cause of his symptoms given the known history and the description of burning symptoms however as his pain has evolved to now the chest and the back and his blood pressure is more elevated than prior we had a discussion and agreed to get a dissection study to rule out aortic cause as well as get better look at his abdomen and chest.  We will however get troponins to make sure he does not have a cardiac cause of symptoms and will give a GI cocktail.  Patient is in agreement with this plan.  We will also do urinalysis given his recent dysuria.  Anticipate reassessment after work-up to determine disposition.   Work-up returned overall reassuring.  Troponin negative x2.  CTA showed no evidence of aneurysm or dissection in the aorta  and did not comment on any large pulmonary embolism.  No other concerning findings acutely in the abdomen, pelvis, or thorax.  Given his history and the GI team suspicion for possible ulcers, with his burning symptoms in his chest, abdomen, and back, I am suspicious he could indeed have ulceration.  Patient will thus continue his home medications but we will add Carafate and he will call his GI team to get seen sooner if possible.  Patient agrees with plan of care and had no other questions or concerns.  Patient will be discharged with understanding return precautions and follow-up instructions         There are no preventive care reminders to display for this patient.   There are no preventive care reminders to display for this patient.  No results found for: "TSH" Lab Results  Component Value Date   WBC 3.8 (L) 12/24/2021   HGB 14.8 12/24/2021   HCT 42.9 12/24/2021   MCV 88.6 12/24/2021   PLT 233 12/24/2021   Lab Results  Component Value Date   NA 139 12/24/2021   K 3.8 12/24/2021   CO2 28 12/24/2021   GLUCOSE 134 (H) 12/24/2021   BUN 8 12/24/2021   CREATININE 0.84 12/24/2021   BILITOT 1.0 12/24/2021   ALKPHOS 62 12/24/2021   AST 19 12/24/2021   ALT 29 12/24/2021   PROT 8.2 (H) 12/24/2021   ALBUMIN 4.5 12/24/2021   CALCIUM 9.5 12/24/2021   ANIONGAP 5 12/24/2021   Lab Results  Component Value Date   CHOL 201 (  H) 07/15/2007   Lab Results  Component Value Date   HDL 40 07/15/2007   Lab Results  Component Value Date   LDLCALC 119 (H) 07/15/2007   Lab Results  Component Value Date   TRIG 209 (H) 07/15/2007   Lab Results  Component Value Date   CHOLHDL 5.0 Ratio 07/15/2007   No results found for: "HGBA1C"    Assessment & Plan:   Problem List Items Addressed This Visit       Respiratory   Mild intermittent asthma without complication    Stable continue albuterol as needed        Digestive   Periodontal disease    Patient has upcoming dental exam to  remove remaining teeth and apply dentures        Musculoskeletal and Integument   RESOLVED: Dermatitis    Resolved        Other   Inguinal hernia of right side without obstruction or gangrene    Stable      Abdominal pain, acute, epigastric    Due to gastritis and duodenitis H. pylori negative continue twice daily omeprazole and discontinue Carafate      Meds ordered this encounter  Medications   omeprazole (PRILOSEC) 40 MG capsule    Sig: Take 1 capsule (40 mg total) by mouth 2 (two) times daily. For 8 weeks    Dispense:  30 capsule    Refill:  2   sertraline (ZOLOFT) 50 MG tablet    Sig: Take 1 tablet (50 mg total) by mouth daily.    Dispense:  30 tablet    Refill:  3  38 minutes spent reviewing the records of the multiple emergency room visits over the past 2 weeks complex decision making high collaboration of care with gastroenterology with a personal phone call to the Dr. Lorenso Courier patient education and multiple prescriptions managements  Follow-up: Return in about 4 months (around 07/08/2022).    Asencion Noble, MD

## 2022-03-08 NOTE — Assessment & Plan Note (Signed)
Resolved

## 2022-03-08 NOTE — Assessment & Plan Note (Signed)
Stable continue albuterol as needed

## 2022-03-27 ENCOUNTER — Encounter (HOSPITAL_COMMUNITY): Payer: Self-pay | Admitting: Orthopedic Surgery

## 2022-03-27 ENCOUNTER — Other Ambulatory Visit: Payer: Self-pay

## 2022-03-27 NOTE — Pre-Procedure Instructions (Signed)
  [phone] [place], [place] - [address] [place] [address] Phone: [phone] Fax: [phone]  [id number] - [place], [place] - [place] [address] [place] [address] Phone: [phone] Fax: [place] [facility] [address] [place] [address] Phone: [phone] Fax: [phone]   PCP - Dr. Asencion Noble  Chest x-ray - 12/24/21 EKG - 12/26/21 ECHO - 10/18/01  ERAS Protcol - Clears until 0051  Anesthesia review: Y  Patient verbally denies any shortness of breath, fever, cough and chest pain during phone call   -------------  SDW INSTRUCTIONS given:  Your procedure is scheduled on 03/28/22.  Report to [facility] Main Entrance "A" at [place].M., and check in at the Admitting office.  Call this number if you have problems the morning of surgery:  [phone]   Remember:  Do not eat after midnight the night before your surgery  You may drink clear liquids until 0815 the morning of your surgery.   Clear liquids allowed are: Water, Non-Citrus Juices (without pulp), Carbonated Beverages, Clear Tea, Black Coffee Only, and Gatorade    Take these medicines the morning of surgery with A SIP OF WATER  omeprazole (PRILOSEC)  albuterol (VENTOLIN HFA)-if needed (Please day of surgery) sucralfate (CARAFATE)-if needed                      Do not wear jewelry, make up, or nail polish            Do not wear lotions, powders, perfumes/colognes, or deodorant.            Do not shave 48 hours prior to surgery.  Men may shave face and neck.            Do not bring valuables to the hospital.            [facility] is not responsible for any belongings or valuables.  Do NOT Smoke (Tobacco/Vaping) 24 hours prior to your procedure If you use a CPAP at night, you may bring  all equipment for your overnight stay.   Contacts, glasses, dentures or bridgework may not be worn into surgery.      For patients admitted to the hospital, discharge time will be determined by your treatment team.   Patients discharged the day of surgery will not be allowed to drive home, and someone needs to stay with them for 24 hours.  Day of Surgery: Please shower morning of surgery  Wear Clean/Comfortable clothing the morning of surgery Do not apply any deodorants/lotions.   Remember to brush your teeth WITH YOUR REGULAR TOOTHPASTE.   Questions were answered. Patient verbalized understanding of instructions.

## 2022-03-28 ENCOUNTER — Ambulatory Visit: Admit: 2022-03-28 | Payer: 59

## 2022-03-28 ENCOUNTER — Ambulatory Visit (HOSPITAL_BASED_OUTPATIENT_CLINIC_OR_DEPARTMENT_OTHER): Payer: 59 | Admitting: Physician Assistant

## 2022-03-28 ENCOUNTER — Ambulatory Visit (HOSPITAL_COMMUNITY): Payer: 59 | Admitting: Physician Assistant

## 2022-03-28 ENCOUNTER — Ambulatory Visit (HOSPITAL_COMMUNITY)
Admission: RE | Admit: 2022-03-28 | Discharge: 2022-03-28 | Disposition: A | Discharge status: Home / Self Care | Payer: 59 | Attending: Internal Medicine | Admitting: Internal Medicine

## 2022-03-28 ENCOUNTER — Ambulatory Visit (HOSPITAL_COMMUNITY)
Admission: RE | Admit: 2022-03-28 | Discharge: 2022-03-28 | Disposition: A | Discharge status: Home / Self Care | Payer: 59 | Source: Ambulatory Visit | Attending: Internal Medicine | Admitting: Internal Medicine

## 2022-03-28 ENCOUNTER — Ambulatory Visit (HOSPITAL_COMMUNITY): Payer: 59

## 2022-03-28 ENCOUNTER — Encounter (HOSPITAL_COMMUNITY): Payer: Self-pay | Admitting: Certified Registered Nurse Anesthetist

## 2022-03-28 ENCOUNTER — Encounter (HOSPITAL_COMMUNITY)
Admission: RE | Disposition: A | Discharge status: Home / Self Care | Payer: Self-pay | Source: Home / Self Care | Attending: Internal Medicine

## 2022-03-28 DIAGNOSIS — Z87891 Personal history of nicotine dependence: Secondary | ICD-10-CM | POA: Insufficient documentation

## 2022-03-28 DIAGNOSIS — D132 Benign neoplasm of duodenum: Secondary | ICD-10-CM | POA: Diagnosis present

## 2022-03-28 DIAGNOSIS — K219 Gastro-esophageal reflux disease without esophagitis: Secondary | ICD-10-CM | POA: Insufficient documentation

## 2022-03-28 HISTORY — PX: RADIOLOGY WITH ANESTHESIA: SHX6223

## 2022-03-28 HISTORY — DX: COVID-19: U07.1

## 2022-03-28 LAB — POCT I-STAT, CHEM 8
BUN: 16 mg/dL (ref 6–20)
Calcium, Ion: 1.18 mmol/L (ref 1.15–1.40)
Chloride: 103 mmol/L (ref 98–111)
Creatinine, Ser: 0.8 mg/dL (ref 0.61–1.24)
Glucose, Bld: 126 mg/dL — ABNORMAL HIGH (ref 70–99)
HCT: 47 % (ref 39.0–52.0)
Hemoglobin: 16 g/dL (ref 13.0–17.0)
Potassium: 3.9 mmol/L (ref 3.5–5.1)
Sodium: 141 mmol/L (ref 135–145)
TCO2: 27 mmol/L (ref 22–32)

## 2022-03-28 SURGERY — MRI WITH ANESTHESIA
Anesthesia: General

## 2022-03-28 MED ORDER — AMISULPRIDE (ANTIEMETIC) 5 MG/2ML IV SOLN: 10.0000 mg | Freq: Once | INTRAVENOUS | Status: DC | PRN

## 2022-03-28 MED ORDER — ROCURONIUM BROMIDE 10 MG/ML (PF) SYRINGE
PREFILLED_SYRINGE | INTRAVENOUS | Status: DC | PRN
  Administered 2022-03-28: 70 mg via INTRAVENOUS

## 2022-03-28 MED ORDER — FENTANYL CITRATE (PF) 100 MCG/2ML IJ SOLN
INTRAMUSCULAR | Status: DC | PRN
  Administered 2022-03-28: 50 ug via INTRAVENOUS

## 2022-03-28 MED ORDER — LIDOCAINE 2% (20 MG/ML) 5 ML SYRINGE
INTRAMUSCULAR | Status: DC | PRN
  Administered 2022-03-28: 60 mg via INTRAVENOUS

## 2022-03-28 MED ORDER — DEXAMETHASONE SODIUM PHOSPHATE 10 MG/ML IJ SOLN
INTRAMUSCULAR | Status: DC | PRN
  Administered 2022-03-28: 5 mg via INTRAVENOUS

## 2022-03-28 MED ORDER — PHENYLEPHRINE HCL-NACL 20-0.9 MG/250ML-% IV SOLN
INTRAVENOUS | Status: DC | PRN
  Administered 2022-03-28: 20 ug/min via INTRAVENOUS

## 2022-03-28 MED ORDER — GADOBUTROL 1 MMOL/ML IV SOLN
7.5000 mL | Freq: Once | INTRAVENOUS | Status: AC | PRN
Start: 2022-03-28 — End: 2022-03-28
  Administered 2022-03-28: 7.5 mL via INTRAVENOUS

## 2022-03-28 MED ORDER — SUGAMMADEX SODIUM 200 MG/2ML IV SOLN
INTRAVENOUS | Status: DC | PRN
  Administered 2022-03-28: 200 mg via INTRAVENOUS

## 2022-03-28 MED ORDER — MIDAZOLAM HCL 2 MG/2ML IJ SOLN
INTRAMUSCULAR | Status: DC | PRN
  Administered 2022-03-28: 2 mg via INTRAVENOUS

## 2022-03-28 MED ORDER — LACTATED RINGERS IV SOLN: INTRAVENOUS | Status: DC

## 2022-03-28 MED ORDER — CHLORHEXIDINE GLUCONATE 0.12 % MT SOLN
15.0000 mL | Freq: Once | OROMUCOSAL | Status: AC
  Administered 2022-03-28: 15 mL via OROMUCOSAL
  Filled 2022-03-28: qty 15

## 2022-03-28 MED ORDER — ONDANSETRON HCL 4 MG/2ML IJ SOLN
INTRAMUSCULAR | Status: DC | PRN
  Administered 2022-03-28: 4 mg via INTRAVENOUS

## 2022-03-28 MED ORDER — ONDANSETRON HCL 4 MG/2ML IJ SOLN: 4.0000 mg | Freq: Once | INTRAMUSCULAR | Status: DC | PRN

## 2022-03-28 MED ORDER — PROPOFOL 10 MG/ML IV BOLUS
INTRAVENOUS | Status: DC | PRN
  Administered 2022-03-28: 100 mg via INTRAVENOUS

## 2022-03-28 MED ORDER — PHENYLEPHRINE 80 MCG/ML (10ML) SYRINGE FOR IV PUSH (FOR BLOOD PRESSURE SUPPORT)
PREFILLED_SYRINGE | INTRAVENOUS | Status: DC | PRN
  Administered 2022-03-28 (×2): 80 ug via INTRAVENOUS

## 2022-03-28 MED ORDER — ORAL CARE MOUTH RINSE: 15.0000 mL | Freq: Once | OROMUCOSAL | Status: AC

## 2022-03-28 NOTE — Transfer of Care (Signed)
Immediate Anesthesia Transfer of Care Note  Patient: Zachary [name]  Procedure(s) Performed: MRI ABDOMEN MRCP WITH AND WITHOUT CONTRAST WITH ANESTHESIA  Patient Location: PACU  Anesthesia Type:General  Level of Consciousness: drowsy, patient cooperative and responds to stimulation  Airway & Oxygen Therapy: Patient Spontanous Breathing  Post-op Assessment: Report given to RN and Post -op Vital signs reviewed and stable  Post vital signs: Reviewed and stable  Last Vitals:  Vitals Value Taken Time  BP 118/82 03/28/22 1305  Temp 37.1 C 03/28/22 1305  Pulse 84 03/28/22 1305  Resp 19 03/28/22 1305  SpO2 93 % 03/28/22 1305  Vitals shown include unvalidated device data.  Last Pain:  Vitals:   03/28/22 0940  PainSc: 0-No pain         Complications: No notable events documented.

## 2022-03-28 NOTE — Anesthesia Procedure Notes (Signed)
Procedure Name: Intubation Date/Time: 03/28/2022 11:56 AM  Performed by: Merryl Hacker, RNPre-anesthesia Checklist: Patient identified, Emergency Drugs available, Suction available and Patient being monitored Patient Re-evaluated:Patient Re-evaluated prior to induction Oxygen Delivery Method: Circle System Utilized Preoxygenation: Pre-oxygenation with 100% oxygen Induction Type: IV induction Ventilation: Mask ventilation without difficulty Laryngoscope Size: Mac and 4 Grade View: Grade I Tube type: Oral Number of attempts: 1 Airway Equipment and Method: Stylet Placement Confirmation: ETT inserted through vocal cords under direct vision, positive ETCO2 and breath sounds checked- equal and bilateral Secured at: 22 cm Tube secured with: Tape Dental Injury: Teeth and Oropharynx as per pre-operative assessment  Comments: Eyes taped prior to DL. Gentle DL with MAC 4, grade 1 view, 7.5 ETT passed through the cords. +chest rise and fall, +BBS, +EtCO2. Performed by Heide Scales, SRNA

## 2022-03-28 NOTE — Anesthesia Preprocedure Evaluation (Addendum)
Anesthesia Evaluation  Patient identified by MRN, date of birth, ID band Patient awake    Reviewed: Allergy & Precautions, NPO status , Patient's Chart, lab work & pertinent test results  Airway Mallampati: III  TM Distance: >3 FB Neck ROM: Full    Dental  (+) Edentulous Upper, Poor Dentition, Missing, Chipped, Dental Advisory Given,    Pulmonary asthma , former smoker,    Pulmonary exam normal breath sounds clear to auscultation       Cardiovascular negative cardio ROS Normal cardiovascular exam Rhythm:Regular Rate:Normal     Neuro/Psych PSYCHIATRIC DISORDERS Anxiety Depression negative neurological ROS     GI/Hepatic Neg liver ROS, GERD  Medicated and Controlled,Duodenal adenoma    Endo/Other  negative endocrine ROS  Renal/GU negative Renal ROS  negative genitourinary   Musculoskeletal negative musculoskeletal ROS (+)   Abdominal   Peds  Hematology negative hematology ROS (+)   Anesthesia Other Findings   Reproductive/Obstetrics negative OB ROS                            Anesthesia Physical Anesthesia Plan  ASA: 2  Anesthesia Plan: General   Post-op Pain Management: Minimal or no pain anticipated   Induction: Intravenous  PONV Risk Score and Plan: 2 and Ondansetron, Treatment may vary due to age or medical condition and Midazolam  Airway Management Planned: Oral ETT  Additional Equipment: None  Intra-op Plan:   Post-operative Plan: Extubation in OR  Informed Consent: I have reviewed the patients History and Physical, chart, labs and discussed the procedure including the risks, benefits and alternatives for the proposed anesthesia with the patient or authorized representative who has indicated his/her understanding and acceptance.     Dental advisory given  Plan Discussed with: CRNA  Anesthesia Plan Comments:        Anesthesia Quick Evaluation

## 2022-03-29 ENCOUNTER — Encounter (HOSPITAL_COMMUNITY): Payer: Self-pay | Admitting: Radiology

## 2022-03-29 NOTE — Anesthesia Postprocedure Evaluation (Signed)
Anesthesia Post Note  Patient: KEYONDRE HEPBURN  Procedure(s) Performed: MRI ABDOMEN MRCP WITH AND WITHOUT CONTRAST WITH ANESTHESIA     Patient location during evaluation: PACU Anesthesia Type: General Level of consciousness: awake and alert, oriented and patient cooperative Pain management: pain level controlled Vital Signs Assessment: post-procedure vital signs reviewed and stable Respiratory status: spontaneous breathing, nonlabored ventilation and respiratory function stable Cardiovascular status: blood pressure returned to baseline and stable Postop Assessment: no apparent nausea or vomiting Anesthetic complications: no   No notable events documented.  Last Vitals:  Vitals:   03/28/22 1320 03/28/22 1335  BP: 107/80 102/74  Pulse: 79 77  Resp: 17 12  Temp:  37.1 C  SpO2: 93% 100%    Last Pain:  Vitals:   03/28/22 1335  PainSc: 0-No pain                 Jarome Matin Kaliana Albino

## 2022-04-03 ENCOUNTER — Encounter: Payer: Self-pay | Admitting: Internal Medicine

## 2022-04-04 ENCOUNTER — Other Ambulatory Visit (INDEPENDENT_AMBULATORY_CARE_PROVIDER_SITE_OTHER): Payer: 59

## 2022-04-04 ENCOUNTER — Telehealth: Payer: Self-pay

## 2022-04-04 ENCOUNTER — Ambulatory Visit (INDEPENDENT_AMBULATORY_CARE_PROVIDER_SITE_OTHER): Payer: Self-pay | Admitting: Gastroenterology

## 2022-04-04 VITALS — BP 102/70 | HR 68 | Ht 66.0 in | Wt 143.0 lb

## 2022-04-04 DIAGNOSIS — D132 Benign neoplasm of duodenum: Secondary | ICD-10-CM | POA: Diagnosis not present

## 2022-04-04 DIAGNOSIS — R933 Abnormal findings on diagnostic imaging of other parts of digestive tract: Secondary | ICD-10-CM

## 2022-04-04 DIAGNOSIS — D135 Benign neoplasm of extrahepatic bile ducts: Secondary | ICD-10-CM

## 2022-04-04 DIAGNOSIS — R1084 Generalized abdominal pain: Secondary | ICD-10-CM | POA: Diagnosis not present

## 2022-04-04 LAB — COMPREHENSIVE METABOLIC PANEL
ALT: 22 U/L (ref 0–53)
AST: 15 U/L (ref 0–37)
Albumin: 4.4 g/dL (ref 3.5–5.2)
Alkaline Phosphatase: 66 U/L (ref 39–117)
BUN: 7 mg/dL (ref 6–23)
CO2: 32 mEq/L (ref 19–32)
Calcium: 9.8 mg/dL (ref 8.4–10.5)
Chloride: 100 mEq/L (ref 96–112)
Creatinine, Ser: 0.86 mg/dL (ref 0.40–1.50)
GFR: 94.59 mL/min (ref >60.00)
Glucose, Bld: 91 mg/dL (ref 70–99)
Potassium: 3.8 mEq/L (ref 3.5–5.1)
Sodium: 137 mEq/L (ref 135–145)
Total Bilirubin: 0.8 mg/dL (ref 0.2–1.2)
Total Protein: 8.1 g/dL (ref 6.0–8.3)

## 2022-04-04 LAB — CBC
HCT: 41.4 % (ref 39.0–52.0)
Hemoglobin: 14.1 g/dL (ref 13.0–17.0)
MCHC: 34.2 g/dL (ref 30.0–36.0)
MCV: 90.8 fl (ref 78.0–100.0)
Platelets: 247 10*3/uL (ref 150.0–400.0)
RBC: 4.56 Mil/uL (ref 4.22–5.81)
RDW: 13.1 % (ref 11.5–15.5)
WBC: 4.1 10*3/uL (ref 4.0–10.5)

## 2022-04-04 LAB — LIPASE: Lipase: 30 U/L (ref 11.0–59.0)

## 2022-04-04 LAB — AMYLASE: Amylase: 47 U/L (ref 27–131)

## 2022-04-04 LAB — PROTIME-INR
INR: 1.1 ratio — ABNORMAL HIGH (ref 0.8–1.0)
Prothrombin Time: 11.9 s (ref 9.6–13.1)

## 2022-04-04 NOTE — Progress Notes (Signed)
[place] VISIT   Primary Care Provider Elsie Stain, MD [address] [place] [address] [phone]  Referring Provider Dr. Lorenso Courier  Patient Profile: Zachary [name] is a 61 y.o. 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  The patient presents to the [facility] for an evaluation and management of problem(s) noted below:  Problem List 1. Ampullary adenoma   2. Duodenal adenoma   3. Abnormal endoscopy of upper gastrointestinal tract   4. Generalized abdominal pain     History of Present Illness Please see prior notes for full details of HPI.  Interval History The patient returns for a follow-up.  In the work-up of his abdominal discomforts he underwent an upper endoscopy by Dr. Lorenso Courier recently.  Gastritis that was H. pylori negative as well as findings of some microscopic acid reflux changes were noted.  A duodenal polyp concerning for potential ampullary lesion was biopsied and returned as a tubular adenoma.  Is for this reason that the patient is referred for consideration of attempt at endoscopic ampullectomy (pending this polyp actually being on the ampulla).  The patient continues to experience abdominal discomfort that comes and goes.  Discomfort is noted in the mid abdomen but also in the lower abdomen at times.  He is never experienced episodes of pancreatitis and has never had obstructive jaundice or abnormal liver tests that he is aware of or been told about.  He also completed a recent colonoscopy with findings of diverticulosis as well as some tubular adenomas.  The patient asks that we call his sister to help him understand the plan of action moving forward and after our 20-minute conversation we called her and we discussed everything we had discussed during the clinic visit with  her.  GI Review of Systems Positive as above Negative for dysphagia, odynophagia, nausea, vomiting, alteration of bowel habits, Melena, hematochezia  Review of Systems General: Denies fevers/chills/unintentional weight loss Cardiovascular: Denies chest pain Pulmonary: Denies shortness of breath Gastroenterological: See HPI Genitourinary: Denies darkened urine Hematological: Denies easy bruising/bleeding Dermatological: Denies jaundice Psychological: Mood is stable   Medications Current Outpatient Medications  Medication Sig Dispense Refill   albuterol (VENTOLIN HFA) 108 (90 Base) MCG/ACT inhaler Inhale 2 puffs into the lungs every 6 (six) hours as needed for wheezing or shortness of breath (cough). 18 g 0   omeprazole (PRILOSEC) 40 MG capsule Take 1 capsule (40 mg total) by mouth 2 (two) times daily. For 8 weeks 30 capsule 2   sertraline (ZOLOFT) 50 MG tablet Take 1 tablet (50 mg total) by mouth daily. 30 tablet 3   sucralfate (CARAFATE) 1 g tablet Take 1 tablet (1 g total) by mouth 4 (four) times daily -  with meals and at bedtime. (Patient taking differently: Take 1 g by mouth 2 (two) times daily as needed (acid reflux).) 30 tablet 0   No current facility-administered medications for this visit.    Allergies No Known Allergies  Histories Past Medical History:  Diagnosis Date   Asthma    COVID-19 virus infection 09/15/2020   Diverticulosis    GERD (gastroesophageal reflux disease)    Knee pain    Past Surgical History:  Procedure Laterality Date   KNEE ARTHROSCOPY     x 2   RADIOLOGY WITH ANESTHESIA N/A 03/28/2022   Procedure: MRI ABDOMEN MRCP WITH AND WITHOUT CONTRAST WITH ANESTHESIA;  Surgeon: Radiologist, Medication, MD;  Location: [facility];  Service: Radiology;  Laterality: N/A;   UPPER GASTROINTESTINAL ENDOSCOPY  12/29/2021   Dr.Ying Lorenso Courier   Social History   Socioeconomic History   Marital status: Single    Spouse name: Not on file   Number of children: Not  on file   Years of education: Not on file   Highest education level: Not on file  Occupational History   Not on file  Tobacco Use   Smoking status: Former   Smokeless tobacco: Never  Vaping Use   Vaping Use: Never used  Substance and Sexual Activity   Alcohol use: No   Drug use: No   Sexual activity: Yes  Other Topics Concern   Not on file  Social History Narrative   Not on file   Social Determinants of Health   Financial Resource Strain: Not on file  Food Insecurity: Not on file  Transportation Needs: Not on file  Physical Activity: Not on file  Stress: Not on file  Social Connections: Not on file  Intimate Partner Violence: Not on file   Family History  Problem Relation Age of Onset   Hypertension Mother    Cancer Father    Colon cancer Maternal Uncle    Colon cancer Cousin    Cancer Other    Esophageal cancer Neg Hx    Inflammatory bowel disease Neg Hx    Liver disease Neg Hx    Pancreatic cancer Neg Hx    Rectal cancer Neg Hx    Stomach cancer Neg Hx    I have reviewed his medical, social, and family history in detail and updated the electronic medical record as necessary.    PHYSICAL EXAMINATION  BP 102/70   Pulse 68   Ht _0  (1.676 m)   Wt 143 lb (64.9 kg)   BMI 23.08 kg/m  Wt Readings from Last 3 Encounters:  04/04/22 143 lb (64.9 kg)  03/28/22 150 lb (68 kg)  03/08/22 144 lb (65.3 kg)  GEN: NAD, appears stated age, doesn't appear chronically ill PSYCH: Cooperative, without pressured speech EYE: Conjunctivae pink, sclerae anicteric ENT: MMM CV: Nontachycardic RESP: No audible wheezing GI: NABS, soft, NT/ND, without rebound or guarding MSK/EXT: No lower extremity edema SKIN: No jaundice NEURO:  Alert & Oriented x 3, no focal deficits   REVIEW OF DATA  I reviewed the following data at the time of this encounter:  GI Procedures and Studies  April 2023 EGD - Salmon-colored mucosa suspicious for Barrett's esophagus. Biopsied. - Gastritis.  Biopsied. - A single duodenal polyp. Biopsied. - Biopsies were taken with a cold forceps for histology in the duodenal bulb and in the second portion of the duodenum.  Pathology Diagnosis 1. Surgical [P], duodenal - PEPTIC DUODENITIS - NO DYSPLASIA OR MALIGNANCY IDENTIFIED 2. Surgical [P], duodenal polyp - DUODENAL ADENOMA WITH LOW-GRADE DYSPLASIA - SEE COMMENT 3. Surgical [P], gastric - CHRONIC GASTRITIS WITH REACTIVE CHANGES AND FOCAL ACTIVITY - NO INTESTINAL METAPLASIA IDENTIFIED - SEE COMMENT 4. Surgical [P], GE injection - SQUAMOCOLUMNAR JUNCTION WITH ACUTE AND CHRONIC INFLAMMATION - NO INTESTINAL METAPLASIA, DYSPLASIA OR MALIGNANCY IDENTIFIED  Laboratory Studies  Reviewed those in epic  Imaging Studies  July 2023 MRI/MRCP Diagnosis Surgical [P], colon, recto-sigmoid - TUBULAR ADENOMA (S) WITHOUT HIGH GRADE DYSPLASIA.  April 2023 CT angio chest/abdomen/pelvis IMPRESSION: Vascular: 1. No findings of aortic dissection or aneurysm. Nonvascular: 1. No acute findings in the chest. 2. No acute findings in the abdomen or pelvis.  ASSESSMENT  Mr. Bralley is a 61 y.o. 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  The patient is seen today for evaluation and management of:  1. Ampullary adenoma   2. Duodenal adenoma   3. Abnormal endoscopy of upper gastrointestinal tract   4. Generalized abdominal pain    The patient is hemodynamically stable.  Clinically his symptoms remain similar to what he was experiencing and for which she has undergone further work-up and evaluation by Dr. Lorenso Courier.  It is not clear that this lesion is causing him his current issues at this time.  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  It is possible that this may still be just a duodenal adenoma but  until we get a better view with the side-viewing duodena scope we will not know.  Based upon the description and endoscopic pictures I do feel that it is reasonable to pursue an Advanced Polypectomy attempt of the polyp/lesion.  We discussed some of the techniques of endoscopic ampullectomy.  I would perform an endoscopic ultrasound to ensure prior to attempt at resection, that intraductal extension of the lesion is not present that would preclude [place] from being able to remove this by his recent MRI/MRCP suggest no biliary or pancreatic duct dilation so this makes this less likely of an issue.  The risks of an ERCP were discussed at length, including but not limited to the risk of perforation, bleeding, abdominal pain, post-ERCP pancreatitis (while usually mild can be severe and even life threatening).  My approach is such that we attempt both pancreatic duct and biliary duct cannulation if possible with small sphincterotomies to hopefully I have increased chance of access post ampullectomy of stent placement.  If unsuccessful cannulation then proceed with ampullectomy with hope that we are able to find the pancreatic orifice and biliary orifice post procedure with rectal indomethacin no matter what given to decrease risk of post ERCP pancreatitis.  During attempts at endoscopic ampullectomy, the risks of bleeding and perforation/leak and pancreatitis are increased as opposed to normal ERCPs, and that was discussed with the patient as well.   In addition, I explained that en-bloc resection is ideal but not always possible.  Subsequent short-interval endoscopic evaluation for follow up and potential retreatment of the lesion/area may be necessary.  I did offer, a referral to surgery in order for patient to have opportunity to discuss surgical ampullectomy/intervention prior to finalizing decision for attempt at endoscopic removal.  The patient feels that he wants to proceed with endoscopic management most likely but  does think a surgical referral would be helpful so that he may hear his options.  If, after endoscopic attempt at removal of the polyp, it is found that the patient has a complication or that an invasive lesion or malignant lesion is found, or that the polyp continues to recur, the patient is aware and understands that surgery may still be indicated/required.  All patient and family questions were answered (noted that we discussed everything with the patient's sister over the phone at the time of the clinic visit), to the best of my ability, and the patient agrees to the aforementioned plan of action with follow-up as indicated.   PLAN  Preprocedure labs to be obtained as outlined below We will repeat amylase and lipase and hepatic function panel with patient still having persisting abdominal discomfort  Proceed with scheduling EUS/ERCP with ampullectomy attempt endoscopically Follow-up to be dictated based on results Further work-up and evaluation of more chronic abdominal discomfort as per patient's primary gastroenterologist   Orders Placed This Encounter  Procedures   CBC   Comp Met (CMET)   Amylase   INR/PT   Lipase    New Prescriptions   No medications on file   Modified Medications   No medications on file    Planned Follow Up No follow-ups on file.   Total Time in Face-to-Face and in Coordination of Care for patient including independent/personal interpretation/review of prior testing, medical history, examination, medication adjustment, communicating results with the patient directly, and documentation within the EHR is 35 minutes.   Justice Britain, MD [facility] Advanced [facility]oscopy Office # [phone]

## 2022-04-04 NOTE — Patient Instructions (Addendum)
If you are age 60 or older, your body mass index should be between 23-30. Your Body mass index is 23.08 kg/m. If this is out of the aforementioned range listed, please consider follow up with your Primary Care Provider.  If you are age 83 or younger, your body mass index should be between 19-25. Your Body mass index is 23.08 kg/m. If this is out of the aformentioned range listed, please consider follow up with your Primary Care Provider.   Dr.Mansourty's nurse Chong Sicilian will call you to schedule your procedure.  Your provider has requested that you go to the basement level for lab work before leaving today. Press "B" on the elevator. The lab is located at the first door on the left as you exit the elevator.    The [place] GI providers would like to encourage you to use [facility] to communicate with providers for non-urgent requests or questions.  Due to long hold times on the telephone, sending your provider a message by [facility] may be a faster and more efficient way to get a response.  Please allow 48 business hours for a response.  Please remember that this is for non-urgent requests.   Due to recent changes in healthcare laws, you may see the results of your imaging and laboratory studies on MyChart before your provider has had a chance to review them.  We understand that in some cases there may be results that are confusing or concerning to you. Not all laboratory results come back in the same time frame and the provider may be waiting for multiple results in order to interpret others.  Please give [place] 48 hours in order for your provider to thoroughly review all the results before contacting the office for clarification of your results.   It was a pleasure to see you today!  Thank you for trusting me with your gastrointestinal care!    Dr.Mansouraty

## 2022-04-04 NOTE — Telephone Encounter (Signed)
-----   Message from Darrall Dears, [place] sent at 04/04/2022  4:00 PM EDT ----- This is the patient that Dr.Mansouraty wants scheduled for an EUS and ERCP( 2 1/2 hours).

## 2022-04-05 ENCOUNTER — Encounter: Payer: Self-pay | Admitting: Gastroenterology

## 2022-04-05 ENCOUNTER — Other Ambulatory Visit: Payer: Self-pay

## 2022-04-05 DIAGNOSIS — D135 Benign neoplasm of extrahepatic bile ducts: Secondary | ICD-10-CM

## 2022-04-05 NOTE — Telephone Encounter (Signed)
The pt has been scheduled for EUS ERCP on 06/07/22 at 730 am at [facility] with [facility].    Left message on machine to call back

## 2022-04-06 ENCOUNTER — Telehealth: Payer: Self-pay | Admitting: Gastroenterology

## 2022-04-06 NOTE — Telephone Encounter (Signed)
See alternate note  

## 2022-04-06 NOTE — Telephone Encounter (Signed)
Patient returned your phone call.  Please return his call.  Thank you.

## 2022-04-06 NOTE — Telephone Encounter (Signed)
EUSERCP scheduled, pt instructed and medications reviewed.  Patient instructions mailed to home.  Patient to call with any questions or concerns.

## 2022-04-07 DIAGNOSIS — R933 Abnormal findings on diagnostic imaging of other parts of digestive tract: Secondary | ICD-10-CM | POA: Insufficient documentation

## 2022-04-07 DIAGNOSIS — D132 Benign neoplasm of duodenum: Secondary | ICD-10-CM | POA: Insufficient documentation

## 2022-04-07 DIAGNOSIS — D135 Benign neoplasm of extrahepatic bile ducts: Secondary | ICD-10-CM | POA: Insufficient documentation

## 2022-04-07 DIAGNOSIS — R1084 Generalized abdominal pain: Secondary | ICD-10-CM | POA: Insufficient documentation

## 2022-05-11 ENCOUNTER — Telehealth: Payer: Self-pay | Admitting: Critical Care Medicine

## 2022-05-11 NOTE — Telephone Encounter (Signed)
Copied from [place] [phone]. Topic: General - Other >> May 11, 2022  3:17 PM Rosanne Ashing P wrote: Reason for CRM: Pt called saying he has been having stomach pains this past week and has been taking Maalox.  He has surgery schedule the 27th of sept with the GI doctor but wants to know if he could get something for stomach pain

## 2022-05-12 NOTE — Telephone Encounter (Signed)
I referred him to GI to manage this condition he should call the GI office

## 2022-05-18 ENCOUNTER — Other Ambulatory Visit: Payer: Self-pay

## 2022-05-18 NOTE — Telephone Encounter (Signed)
Called patient and left voicemail about doctors note

## 2022-05-31 ENCOUNTER — Encounter (HOSPITAL_COMMUNITY): Payer: Self-pay | Admitting: Gastroenterology

## 2022-06-01 ENCOUNTER — Encounter (HOSPITAL_COMMUNITY): Payer: Self-pay | Admitting: Gastroenterology

## 2022-06-01 NOTE — Progress Notes (Signed)
Attempted to obtain medical history via telephone, unable to reach at this time. HIPAA compliant voicemail message left requesting return call to pre surgical testing department. 

## 2022-06-06 ENCOUNTER — Encounter (HOSPITAL_COMMUNITY): Payer: Self-pay | Admitting: Gastroenterology

## 2022-06-06 NOTE — Anesthesia Preprocedure Evaluation (Addendum)
Anesthesia Evaluation  Patient identified by MRN, date of birth, ID band Patient awake    Reviewed: Allergy & Precautions, NPO status , Patient's Chart, lab work & pertinent test results  Airway Mallampati: II       Dental   Pulmonary asthma , former smoker,    breath sounds clear to auscultation       Cardiovascular negative cardio ROS   Rhythm:Regular Rate:Normal     Neuro/Psych PSYCHIATRIC DISORDERS    GI/Hepatic Neg liver ROS, GERD  ,Hx noted Dr. Chilton Si   Endo/Other  negative endocrine ROS  Renal/GU negative Renal ROS     Musculoskeletal   Abdominal   Peds  Hematology   Anesthesia Other Findings   Reproductive/Obstetrics                            Anesthesia Physical Anesthesia Plan  ASA: 3  Anesthesia Plan:    Post-op Pain Management:    Induction:   PONV Risk Score and Plan: Treatment may vary due to age or medical condition  Airway Management Planned:   Additional Equipment:   Intra-op Plan:   Post-operative Plan:   Informed Consent:   Plan Discussed with: Anesthesiologist  Anesthesia Plan Comments:        Anesthesia Quick Evaluation

## 2022-06-07 ENCOUNTER — Ambulatory Visit (HOSPITAL_BASED_OUTPATIENT_CLINIC_OR_DEPARTMENT_OTHER): Payer: 59 | Admitting: Anesthesiology

## 2022-06-07 ENCOUNTER — Other Ambulatory Visit: Payer: Self-pay

## 2022-06-07 ENCOUNTER — Ambulatory Visit (HOSPITAL_COMMUNITY)
Admission: RE | Admit: 2022-06-07 | Discharge: 2022-06-07 | Disposition: A | Discharge status: Home / Self Care | Payer: 59 | Attending: Gastroenterology | Admitting: Gastroenterology

## 2022-06-07 ENCOUNTER — Encounter (HOSPITAL_COMMUNITY)
Admission: RE | Disposition: A | Discharge status: Home / Self Care | Payer: Self-pay | Source: Home / Self Care | Attending: Gastroenterology

## 2022-06-07 ENCOUNTER — Ambulatory Visit (HOSPITAL_COMMUNITY): Payer: 59 | Admitting: Anesthesiology

## 2022-06-07 ENCOUNTER — Encounter (HOSPITAL_COMMUNITY): Payer: Self-pay | Admitting: Gastroenterology

## 2022-06-07 DIAGNOSIS — Z87891 Personal history of nicotine dependence: Secondary | ICD-10-CM | POA: Diagnosis not present

## 2022-06-07 DIAGNOSIS — K317 Polyp of stomach and duodenum: Secondary | ICD-10-CM

## 2022-06-07 DIAGNOSIS — K219 Gastro-esophageal reflux disease without esophagitis: Secondary | ICD-10-CM | POA: Insufficient documentation

## 2022-06-07 DIAGNOSIS — J45909 Unspecified asthma, uncomplicated: Secondary | ICD-10-CM | POA: Insufficient documentation

## 2022-06-07 DIAGNOSIS — K838 Other specified diseases of biliary tract: Secondary | ICD-10-CM

## 2022-06-07 DIAGNOSIS — K869 Disease of pancreas, unspecified: Secondary | ICD-10-CM | POA: Insufficient documentation

## 2022-06-07 DIAGNOSIS — D367 Benign neoplasm of other specified sites: Secondary | ICD-10-CM | POA: Diagnosis not present

## 2022-06-07 DIAGNOSIS — K2289 Other specified disease of esophagus: Secondary | ICD-10-CM | POA: Diagnosis not present

## 2022-06-07 DIAGNOSIS — D135 Benign neoplasm of extrahepatic bile ducts: Secondary | ICD-10-CM

## 2022-06-07 DIAGNOSIS — K3189 Other diseases of stomach and duodenum: Secondary | ICD-10-CM | POA: Insufficient documentation

## 2022-06-07 DIAGNOSIS — I899 Noninfective disorder of lymphatic vessels and lymph nodes, unspecified: Secondary | ICD-10-CM

## 2022-06-07 DIAGNOSIS — R933 Abnormal findings on diagnostic imaging of other parts of digestive tract: Secondary | ICD-10-CM | POA: Insufficient documentation

## 2022-06-07 HISTORY — PX: BIOPSY: SHX5522

## 2022-06-07 HISTORY — PX: ESOPHAGOGASTRODUODENOSCOPY (EGD) WITH PROPOFOL: SHX5813

## 2022-06-07 HISTORY — PX: EUS: SHX5427

## 2022-06-07 SURGERY — UPPER ENDOSCOPIC ULTRASOUND (EUS) RADIAL
Anesthesia: Monitor Anesthesia Care

## 2022-06-07 MED ORDER — SUGAMMADEX SODIUM 200 MG/2ML IV SOLN
INTRAVENOUS | Status: DC | PRN
  Administered 2022-06-07: 300 mg via INTRAVENOUS

## 2022-06-07 MED ORDER — DEXAMETHASONE SODIUM PHOSPHATE 10 MG/ML IJ SOLN
INTRAMUSCULAR | Status: DC | PRN
  Administered 2022-06-07: 4 mg via INTRAVENOUS

## 2022-06-07 MED ORDER — PROPOFOL 10 MG/ML IV BOLUS
INTRAVENOUS | Status: DC | PRN
  Administered 2022-06-07: 80 mg via INTRAVENOUS

## 2022-06-07 MED ORDER — ROCURONIUM BROMIDE 10 MG/ML (PF) SYRINGE
PREFILLED_SYRINGE | INTRAVENOUS | Status: DC | PRN
  Administered 2022-06-07: 70 mg via INTRAVENOUS

## 2022-06-07 MED ORDER — LACTATED RINGERS IV SOLN
INTRAVENOUS | Status: AC | PRN
  Administered 2022-06-07: 1000 mL via INTRAVENOUS

## 2022-06-07 MED ORDER — CIPROFLOXACIN IN D5W 400 MG/200ML IV SOLN
INTRAVENOUS | Status: DC | PRN
  Administered 2022-06-07: 400 mg via INTRAVENOUS

## 2022-06-07 MED ORDER — MIDAZOLAM HCL 5 MG/5ML IJ SOLN
INTRAMUSCULAR | Status: DC | PRN
  Administered 2022-06-07: 2 mg via INTRAVENOUS

## 2022-06-07 MED ORDER — SUCCINYLCHOLINE CHLORIDE 200 MG/10ML IV SOSY
PREFILLED_SYRINGE | INTRAVENOUS | Status: DC | PRN
  Administered 2022-06-07: 140 mg via INTRAVENOUS

## 2022-06-07 MED ORDER — CIPROFLOXACIN IN D5W 400 MG/200ML IV SOLN
INTRAVENOUS | Status: AC
  Filled 2022-06-07: qty 200

## 2022-06-07 MED ORDER — FENTANYL CITRATE (PF) 100 MCG/2ML IJ SOLN
INTRAMUSCULAR | Status: AC
  Filled 2022-06-07: qty 2

## 2022-06-07 MED ORDER — SODIUM CHLORIDE 0.9 % IV SOLN: INTRAVENOUS | Status: DC

## 2022-06-07 MED ORDER — ONDANSETRON HCL 4 MG/2ML IJ SOLN
INTRAMUSCULAR | Status: DC | PRN
  Administered 2022-06-07: 4 mg via INTRAVENOUS

## 2022-06-07 MED ORDER — FENTANYL CITRATE (PF) 100 MCG/2ML IJ SOLN
INTRAMUSCULAR | Status: DC | PRN
  Administered 2022-06-07 (×2): 50 ug via INTRAVENOUS

## 2022-06-07 MED ORDER — PHENYLEPHRINE 80 MCG/ML (10ML) SYRINGE FOR IV PUSH (FOR BLOOD PRESSURE SUPPORT)
PREFILLED_SYRINGE | INTRAVENOUS | Status: DC | PRN
  Administered 2022-06-07 (×5): 80 ug via INTRAVENOUS
  Administered 2022-06-07: 160 ug via INTRAVENOUS
  Administered 2022-06-07: 80 ug via INTRAVENOUS

## 2022-06-07 MED ORDER — PHENYLEPHRINE HCL-NACL 20-0.9 MG/250ML-% IV SOLN
INTRAVENOUS | Status: DC | PRN
  Administered 2022-06-07: 30 ug/min via INTRAVENOUS

## 2022-06-07 MED ORDER — PROPOFOL 500 MG/50ML IV EMUL
INTRAVENOUS | Status: AC
  Filled 2022-06-07: qty 50

## 2022-06-07 MED ORDER — INDOMETHACIN 50 MG RE SUPP
RECTAL | Status: AC
  Filled 2022-06-07: qty 2

## 2022-06-07 MED ORDER — LIDOCAINE 2% (20 MG/ML) 5 ML SYRINGE
INTRAMUSCULAR | Status: DC | PRN
  Administered 2022-06-07: 80 mg via INTRAVENOUS

## 2022-06-07 MED ORDER — MIDAZOLAM HCL 2 MG/2ML IJ SOLN
INTRAMUSCULAR | Status: AC
  Filled 2022-06-07: qty 2

## 2022-06-07 MED ORDER — PROPOFOL 1000 MG/100ML IV EMUL
INTRAVENOUS | Status: AC
  Filled 2022-06-07: qty 100

## 2022-06-07 NOTE — Op Note (Signed)
[facility] Patient Name: Zachary [name] Procedure Date: 06/07/2022 MRN: [id number] Attending MD: Justice Britain , MD Date of Birth: 1961-06-28 CSN: [id number] Age: 61 Admit Type: Outpatient Procedure:                Upper EUS Indications:              Duodenal mucosal mass/polyp found on endoscopy,                            Mucosal mass/polyp involving the major papilla                            (found on endoscopy), For therapy of polyps in the                            duodenum Providers:                Justice Britain, MD, Jeanella Cara, RN,                            [facility] Technician, Technician Referring MD:             Adline Mango" Lorenso Courier Asencion Noble, MD Medicines:                Monitored Anesthesia Care Complications:            No immediate complications. Estimated Blood Loss:     Estimated blood loss was minimal. Procedure:                Pre-Anesthesia Assessment:                           - Prior to the procedure, a History and Physical                            was performed, and patient medications and                            allergies were reviewed. The patient's tolerance of                            previous anesthesia was also reviewed. The risks                            and benefits of the procedure and the sedation                            options and risks were discussed with the patient.                            All questions were answered, and informed consent                            was obtained. Prior Anticoagulants: The patient has  taken no previous anticoagulant or antiplatelet                            agents. ASA Grade Assessment: II - A patient with                            mild systemic disease. After reviewing the risks                            and benefits, the patient was deemed in                            satisfactory condition to undergo the procedure.                            After obtaining informed consent, the endoscope was                            passed under direct vision. Throughout the                            procedure, the patient's blood pressure, pulse, and                            oxygen saturations were monitored continuously. The                            [id number]-H190 ([id number]) Olympus endoscope was introduced                            through the mouth, and advanced to the second part                            of duodenum. The [id number]-[id number] ([id number]) Olympus                            duodenoscope was introduced through the mouth, and                            advanced to the area of papilla. The GF-[id number]                            ([id number]) Olympus linear ultrasound scope was                            introduced through the mouth, and advanced to the                            duodenum for ultrasound examination from the                            stomach and duodenum. The GF-[id number]-AL5 ([id number])  Olympus radial ultrasound scope was introduced                            through the mouth, and advanced to the duodenum for                            ultrasound examination. The upper EUS was                            accomplished without difficulty. The patient                            tolerated the procedure. Scope In: Scope Out: Findings:      ENDOSCOPIC FINDING: :      No gross lesions were noted in the entire esophagus.      The Z-line was irregular and was found 40 cm from the incisors.      No gross lesions were noted in the entire examined stomach.      A medium-sized polypoid and partially ulcerated lesion with no bleeding       was found at the major papilla. The biliary orifice was noted just below       the slight ulceration. Pancreatic orifice/secretions were not noted.       After the completion of the EUS, biopsies were taken with a cold forceps       for histology of the  ulcerated region to rule out underlying malignancy.      No other gross lesions were noted in the duodenal bulb, in the first       portion of the duodenum and in the second portion of the duodenum.      ENDOSONOGRAPHIC FINDING: :      An isoechoic lobulated lesion was identified endosonographically at the       ampulla. The lesion did not measured 20 mm by 10 mm in maximal       cross-sectional diameter. The outer margins were irregular. There was       sonographic evidence suggesting invasion into the deep mucosa (Layer 2)       and not completely clear but potentially the submucosa (layer 3).      The pancreatic duct had prominent endosonographic appearance in the       pancreatic head (3.1 mm). The rest of the pancreas duct upstream was       normal (PD neck?"1.0 mm, PD body?"0.7 mm, PD tail?"0.8 mm).      Pancreatic parenchymal abnormalities were noted in the entire pancreas.       These consisted of lobularity without honeycombing and hyperechoic       strands.      There was no sign of significant endosonographic abnormality in the       common bile duct and in the common hepatic duct. An unremarkable       gallbladder and ducts of normal caliber were identified.      Endosonographic imaging in the visualized portion of the liver showed no       mass.      No malignant-appearing lymph nodes were visualized in the celiac region       (level 20), peripancreatic region and porta hepatis region.      The celiac region was visualized. Impression:  EGD impression:                           - No gross lesions in esophagus. Z-line irregular,                            40 cm from the incisors.                           - No gross lesions in the stomach.                           - Polypoid lesion with ulceration noted at the                            major papilla. Rule out malignancy, duodenal mass.                            Biopsied after completion of EUS.                            - No other gross lesions in the duodenal bulb, in                            the first portion of the duodenum and in the second                            portion of the duodenum.                           EUS impression                           - A lesion was found at the ampulla. A tissue                            diagnosis was obtained prior to this exam with just                            being an adenoma. However as noted above, there are                            some findings that are a bit more concerning as to                            whether more invasive and this could be present.                            This was not removed today.                           - The pancreatic duct had a very minimally  prominent endosonographic appearance in the                            pancreatic head with a normal PD diameter                            throughout the rest of the pancreas.                           - Pancreatic parenchymal abnormalities consisting                            of lobularity and hyperechoic strands were noted in                            the entire pancreas.                           - There was no sign of significant pathology in the                            common bile duct and in the common hepatic duct.                           - No malignant-appearing lymph nodes were                            visualized in the celiac region (level 20),                            peripancreatic region and porta hepatis region. Moderate Sedation:      Not Applicable - Patient had care per Anesthesia. Recommendation:           - The patient will be observed post-procedure,                            until all discharge criteria are met.                           - Discharge patient to home.                           - Patient has a contact number available for                            emergencies. The signs and symptoms  of potential                            delayed complications were discussed with the                            patient. Return to normal activities tomorrow.                            Written discharge  instructions were provided to the                            patient.                           - Low fat diet.                           - Observe patient's clinical course.                           - Await path results.                           - Based on the final pathology, will decide upon                            approach endoscopic ampullectomy versus surgical                            ampullectomy referral. Certainly, if there is                            underlying malignancy, we will likely move forward                            with surgical approach.                           - The findings and recommendations were discussed                            with the patient.                           - The findings and recommendations were discussed                            with the patient's family. Procedure Code(s):        --- Professional ---                           [phone], Esophagogastroduodenoscopy, flexible,                            transoral; with endoscopic ultrasound examination                            limited to the esophagus, stomach or duodenum, and                            adjacent structures                           43239, Esophagogastroduodenoscopy, flexible,  transoral; with biopsy, single or multiple Diagnosis Code(s):        --- Professional ---                           K22.8, Other specified diseases of esophagus                           K31.89, Other diseases of stomach and duodenum                           K86.9, Disease of pancreas, unspecified                           I89.9, Noninfective disorder of lymphatic vessels                            and lymph nodes, unspecified                           K83.9,  Disease of biliary tract, unspecified                           K31.7, Polyp of stomach and duodenum                           K83.8, Other specified diseases of biliary tract                           R93.3, Abnormal findings on diagnostic imaging of                            other parts of digestive tract CPT copyright 2019 [employer]. All rights reserved. The codes documented in this report are preliminary and upon coder review may  be revised to meet current compliance requirements. Justice Britain, MD 06/07/2022 10:00:22 AM Number of Addenda: 0

## 2022-06-07 NOTE — Anesthesia Postprocedure Evaluation (Deleted)
Anesthesia Post Note  Patient: Zachary [name]  Procedure(s) Performed: UPPER ENDOSCOPIC ULTRASOUND (EUS) RADIAL ENDOSCOPIC RETROGRADE CHOLANGIOPANCREATOGRAPHY (ERCP) WITH PROPOFOL     Patient location during evaluation: Endoscopy Anesthesia Type: MAC Level of consciousness: awake Pain management: pain level controlled Vital Signs Assessment: post-procedure vital signs reviewed and stable Respiratory status: spontaneous breathing Cardiovascular status: stable Postop Assessment: no apparent nausea or vomiting Anesthetic complications: no   No notable events documented.  Last Vitals:  Vitals:   06/07/22 0950 06/07/22 1000  BP: 120/63 132/81  Pulse: 66 64  Resp: 13 14  Temp:    SpO2: 93% 93%    Last Pain:  Vitals:   06/07/22 1000  TempSrc:   PainSc: 0-No pain                 Vaishali Baise

## 2022-06-07 NOTE — Anesthesia Postprocedure Evaluation (Signed)
Anesthesia Post Note  Patient: Zachary [name]  Procedure(s) Performed: UPPER ENDOSCOPIC ULTRASOUND (EUS) RADIAL ENDOSCOPIC RETROGRADE CHOLANGIOPANCREATOGRAPHY (ERCP) WITH PROPOFOL     Patient location during evaluation: Endoscopy Anesthesia Type: MAC Level of consciousness: awake Pain management: pain level controlled Respiratory status: spontaneous breathing Cardiovascular status: stable Postop Assessment: no apparent nausea or vomiting Anesthetic complications: no   No notable events documented.  Last Vitals:  Vitals:   06/07/22 0950 06/07/22 1000  BP: 120/63 132/81  Pulse: 66 64  Resp: 13 14  Temp:    SpO2: 93% 93%    Last Pain:  Vitals:   06/07/22 1000  TempSrc:   PainSc: 0-No pain                 Andjela Wickes

## 2022-06-07 NOTE — Discharge Instructions (Signed)
YOU HAD AN ENDOSCOPIC PROCEDURE TODAY: Refer to the procedure report and other information in the discharge instructions given to you for any specific questions about what was found during the examination. If this information does not answer your questions, please call [place] office at [phone] to clarify.   YOU SHOULD EXPECT: Some feelings of bloating in the abdomen. Passage of more gas than usual. Walking can help get rid of the air that was put into your GI tract during the procedure and reduce the bloating. If you had a lower endoscopy (such as a colonoscopy or flexible sigmoidoscopy) you may notice spotting of blood in your stool or on the toilet paper. Some abdominal soreness may be present for a day or two, also.  DIET: Your first meal following the procedure should be a light meal and then it is ok to progress to your normal diet. A half-sandwich or bowl of soup is an example of a good first meal. Heavy or fried foods are harder to digest and may make you feel nauseous or bloated. Drink plenty of fluids but you should avoid alcoholic beverages for 24 hours. If you had a esophageal dilation, please see attached instructions for diet.    ACTIVITY: Your care partner should take you home directly after the procedure. You should plan to take it easy, moving slowly for the rest of the day. You can resume normal activity the day after the procedure however YOU SHOULD NOT DRIVE, use power tools, machinery or perform tasks that involve climbing or major physical exertion for 24 hours (because of the sedation medicines used during the test).   SYMPTOMS TO REPORT IMMEDIATELY: A gastroenterologist can be reached at any hour. Please call [phone]  for any of the following symptoms:   Following upper endoscopy (EGD, EUS, ERCP, esophageal dilation) Vomiting of blood or coffee ground material  New, significant abdominal pain  New, significant chest pain or pain under the shoulder blades  Painful or  persistently difficult swallowing  New shortness of breath  Black, tarry-looking or red, bloody stools  FOLLOW UP:  If any biopsies were taken you will be contacted by phone or by letter within the next 1-3 weeks. Call [phone]  if you have not heard about the biopsies in 3 weeks.  Please also call with any specific questions about appointments or follow up tests.  

## 2022-06-07 NOTE — Anesthesia Procedure Notes (Signed)
Procedure Name: Intubation Date/Time: 06/07/2022 8:10 AM  Performed by: Lavina Hamman, CRNAPre-anesthesia Checklist: Patient identified, Emergency Drugs available, Suction available, Patient being monitored and Timeout performed Patient Re-evaluated:Patient Re-evaluated prior to induction Oxygen Delivery Method: Circle system utilized Preoxygenation: Pre-oxygenation with 100% oxygen Induction Type: IV induction, Rapid sequence and Cricoid Pressure applied Ventilation: Mask ventilation without difficulty Laryngoscope Size: Mac and 4 Grade View: Grade II Tube type: Oral Tube size: 7.0 mm Number of attempts: 1 Airway Equipment and Method: Stylet Placement Confirmation: ETT inserted through vocal cords under direct vision, positive ETCO2, CO2 detector and breath sounds checked- equal and bilateral Secured at: 23 cm Tube secured with: Tape Dental Injury: Teeth and Oropharynx as per pre-operative assessment  Comments: ATOI, very few poor condition teeth on bottom unchanged.

## 2022-06-07 NOTE — Transfer of Care (Signed)
Immediate Anesthesia Transfer of Care Note  Patient: Zachary [name]  Procedure(s) Performed: Procedure(s): UPPER ENDOSCOPIC ULTRASOUND (EUS) RADIAL (N/A) ENDOSCOPIC RETROGRADE CHOLANGIOPANCREATOGRAPHY (ERCP) WITH PROPOFOL (N/A)  Patient Location: PACU  Anesthesia Type:General  Level of Consciousness:  sedated, patient cooperative and responds to stimulation  Airway & Oxygen Therapy:Patient Spontanous Breathing and Patient connected to face mask oxgen  Post-op Assessment:  Report given to PACU RN and Post -op Vital signs reviewed and stable  Post vital signs:  Reviewed and stable  Last Vitals:  Vitals:   06/07/22 0929 06/07/22 0930  BP: (!) 172/93   Pulse: 64 62  Resp: 18 15  Temp: (!) 36.4 C   SpO2: 323% 557%    Complications: No apparent anesthesia complications

## 2022-06-07 NOTE — H&P (Signed)
GASTROENTEROLOGY PROCEDURE H&P NOTE   Primary Care Physician: Elsie Stain, MD  HPI: Zachary [name] is a 61 y.o. 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  Past Medical History:  Diagnosis Date   Asthma    COVID-19 virus infection 09/15/2020   Diverticulosis    GERD (gastroesophageal reflux disease)    Knee pain    Past Surgical History:  Procedure Laterality Date   KNEE ARTHROSCOPY     x 2   RADIOLOGY WITH ANESTHESIA N/A 03/28/2022   Procedure: MRI ABDOMEN MRCP WITH AND WITHOUT CONTRAST WITH ANESTHESIA;  Surgeon: Radiologist, Medication, MD;  Location: [facility];  Service: Radiology;  Laterality: N/A;   UPPER GASTROINTESTINAL ENDOSCOPY  12/29/2021   Dr.Ying Lorenso Courier   Current Facility-Administered Medications  Medication Dose Route Frequency Provider Last Rate Last Admin   0.9 %  sodium chloride infusion   Intravenous Continuous Mansouraty, Telford Nab., MD       lactated ringers infusion    Continuous PRN Mansouraty, Telford Nab., MD 120 mL/hr at 06/07/22 0729 1,000 mL at 06/07/22 0729    Current Facility-Administered Medications:    0.9 %  sodium chloride infusion, , Intravenous, Continuous, Mansouraty, Telford Nab., MD   lactated ringers infusion, , , Continuous PRN, Mansouraty, Telford Nab., MD, Last Rate: 120 mL/hr at 06/07/22 0729, 1,000 mL at 06/07/22 0729 No Known Allergies Family History  Problem Relation Age of Onset   Hypertension Mother    Cancer Father    Colon cancer Maternal Uncle    Colon cancer Cousin    Cancer Other    Esophageal cancer Neg Hx    Inflammatory bowel disease Neg Hx    Liver disease Neg Hx    Pancreatic cancer Neg Hx    Rectal cancer Neg Hx    Stomach cancer Neg Hx    Social History   Socioeconomic History   Marital status: Single    Spouse name: Not on file   Number of children: Not on file   Years of education: Not on file   Highest education level: Not on file   Occupational History   Not on file  Tobacco Use   Smoking status: Former   Smokeless tobacco: Never  Vaping Use   Vaping Use: Never used  Substance and Sexual Activity   Alcohol use: No   Drug use: No   Sexual activity: Yes  Other Topics Concern   Not on file  Social History Narrative   Not on file   Social Determinants of Health   Financial Resource Strain: Not on file  Food Insecurity: Not on file  Transportation Needs: Not on file  Physical Activity: Not on file  Stress: Not on file  Social Connections: Not on file  Intimate Partner Violence: Not on file    Physical Exam: Today's Vitals   06/01/22 1226 06/07/22 0712  BP:  (!) 144/90  Pulse:  60  Resp:  18  Temp:  97.9 F (36.6 C)  TempSrc:  Temporal  SpO2:  97%  Weight: 70.3 kg 70.3 kg  Height:  '5\' 6"'$  (1.676 m)  PainSc:  0-No pain   Body mass index is 25.02 kg/m. GEN: NAD EYE: Sclerae anicteric ENT: MMM CV: Non-tachycardic GI: Soft, NT/ND NEURO:  Alert & Oriented x 3  Lab Results: No results for input(s): "WBC", "HGB", "HCT", "PLT" in the last 72 hours. BMET No results for input(s): "NA", "K", "CL", "CO2", "GLUCOSE", "  BUN", "CREATININE", "CALCIUM" in the last 72 hours. LFT No results for input(s): "PROT", "ALBUMIN", "AST", "ALT", "ALKPHOS", "BILITOT", "BILIDIR", "IBILI" in the last 72 hours. PT/INR No results for input(s): "LABPROT", "INR" in the last 72 hours.   Impression / Plan: This is a 61 y.o.male who presents for EGD/EUS with ERCP for possible ampullectomy of adenomatous tissue in the duodenum.  The risks of an EUS including intestinal perforation, bleeding, infection, aspiration, and medication effects were discussed as was the possibility it may not give a definitive diagnosis if a biopsy is performed.  When a biopsy of the pancreas is done as part of the EUS, there is an additional risk of pancreatitis at the rate of about 1-2%.  It was explained that procedure related pancreatitis is  typically mild, although it can be severe and even life threatening, which is why we do not perform random pancreatic biopsies and only biopsy a lesion/area we feel is concerning enough to warrant the risk.  The risks of an ERCP were discussed at length, including but not limited to the risk of perforation, bleeding, abdominal pain, post-ERCP pancreatitis (while usually mild can be severe and even life threatening).  The risks and benefits of endoscopic evaluation/treatment were discussed with the patient and/or family; these include but are not limited to the risk of perforation, infection, bleeding, missed lesions, lack of diagnosis, severe illness requiring hospitalization, as well as anesthesia and sedation related illnesses.  The patient's history has been reviewed, patient examined, no change in status, and deemed stable for procedure.  The patient and/or family is agreeable to proceed.    Justice Britain, MD [facility] Office # [phone]

## 2022-06-08 LAB — SURGICAL PATHOLOGY

## 2022-06-09 ENCOUNTER — Encounter: Payer: Self-pay | Admitting: Gastroenterology

## 2022-06-12 ENCOUNTER — Other Ambulatory Visit: Payer: Self-pay

## 2022-06-12 ENCOUNTER — Telehealth: Payer: Self-pay | Admitting: Gastroenterology

## 2022-06-12 DIAGNOSIS — D135 Benign neoplasm of extrahepatic bile ducts: Secondary | ICD-10-CM

## 2022-06-12 NOTE — Telephone Encounter (Signed)
Patients sister Zachary [name] called states the doctor has been trying to reach her to go over path results, however she now has a new number. Requesting a call back contact information has been updated.

## 2022-06-12 NOTE — Telephone Encounter (Signed)
FYI:  Dr Mansouraty  

## 2022-06-12 NOTE — Telephone Encounter (Signed)
Thank you for letting me know. 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 We will proceed with scheduling ERCP with ampullectomy as available. GM

## 2022-06-20 ENCOUNTER — Encounter (HOSPITAL_COMMUNITY): Payer: Self-pay | Admitting: Gastroenterology

## 2022-06-27 ENCOUNTER — Ambulatory Visit (HOSPITAL_COMMUNITY): Payer: Commercial Managed Care - HMO

## 2022-06-27 ENCOUNTER — Encounter (HOSPITAL_COMMUNITY)
Admission: RE | Disposition: A | Discharge status: Home / Self Care | Payer: Self-pay | Source: Home / Self Care | Attending: Gastroenterology

## 2022-06-27 ENCOUNTER — Ambulatory Visit (HOSPITAL_BASED_OUTPATIENT_CLINIC_OR_DEPARTMENT_OTHER): Payer: Commercial Managed Care - HMO | Admitting: Registered Nurse

## 2022-06-27 ENCOUNTER — Ambulatory Visit (HOSPITAL_COMMUNITY)
Admission: RE | Admit: 2022-06-27 | Discharge: 2022-06-27 | Disposition: A | Discharge status: Home / Self Care | Payer: Commercial Managed Care - HMO | Attending: Gastroenterology | Admitting: Gastroenterology

## 2022-06-27 ENCOUNTER — Ambulatory Visit (HOSPITAL_COMMUNITY): Payer: Commercial Managed Care - HMO | Admitting: Registered Nurse

## 2022-06-27 ENCOUNTER — Other Ambulatory Visit: Payer: Self-pay

## 2022-06-27 ENCOUNTER — Encounter (HOSPITAL_COMMUNITY): Payer: Self-pay | Admitting: Gastroenterology

## 2022-06-27 DIAGNOSIS — D135 Benign neoplasm of extrahepatic bile ducts: Secondary | ICD-10-CM

## 2022-06-27 DIAGNOSIS — K317 Polyp of stomach and duodenum: Secondary | ICD-10-CM

## 2022-06-27 DIAGNOSIS — K838 Other specified diseases of biliary tract: Secondary | ICD-10-CM | POA: Insufficient documentation

## 2022-06-27 DIAGNOSIS — J45909 Unspecified asthma, uncomplicated: Secondary | ICD-10-CM

## 2022-06-27 DIAGNOSIS — F418 Other specified anxiety disorders: Secondary | ICD-10-CM | POA: Diagnosis not present

## 2022-06-27 DIAGNOSIS — Z87891 Personal history of nicotine dependence: Secondary | ICD-10-CM | POA: Insufficient documentation

## 2022-06-27 DIAGNOSIS — K219 Gastro-esophageal reflux disease without esophagitis: Secondary | ICD-10-CM | POA: Insufficient documentation

## 2022-06-27 HISTORY — PX: ENDOSCOPIC RETROGRADE CHOLANGIOPANCREATOGRAPHY (ERCP) WITH PROPOFOL: SHX5810

## 2022-06-27 SURGERY — ENDOSCOPIC RETROGRADE CHOLANGIOPANCREATOGRAPHY (ERCP) WITH PROPOFOL
Anesthesia: General

## 2022-06-27 MED ORDER — FENTANYL CITRATE (PF) 250 MCG/5ML IJ SOLN
INTRAMUSCULAR | Status: DC | PRN
  Administered 2022-06-27: 50 ug via INTRAVENOUS

## 2022-06-27 MED ORDER — LACTATED RINGERS IV SOLN: INTRAVENOUS | Status: DC | PRN

## 2022-06-27 MED ORDER — CIPROFLOXACIN IN D5W 400 MG/200ML IV SOLN
INTRAVENOUS | Status: AC
  Filled 2022-06-27: qty 200

## 2022-06-27 MED ORDER — FENTANYL CITRATE (PF) 100 MCG/2ML IJ SOLN
INTRAMUSCULAR | Status: AC
  Filled 2022-06-27: qty 2

## 2022-06-27 MED ORDER — PROPOFOL 10 MG/ML IV BOLUS
INTRAVENOUS | Status: DC | PRN
  Administered 2022-06-27: 100 mg via INTRAVENOUS

## 2022-06-27 MED ORDER — GLUCAGON HCL RDNA (DIAGNOSTIC) 1 MG IJ SOLR
INTRAMUSCULAR | Status: DC | PRN
  Administered 2022-06-27 (×2): .25 mg via INTRAVENOUS

## 2022-06-27 MED ORDER — INDOMETHACIN 50 MG RE SUPP
RECTAL | Status: DC | PRN
  Administered 2022-06-27: 100 mg via RECTAL

## 2022-06-27 MED ORDER — PHENYLEPHRINE 80 MCG/ML (10ML) SYRINGE FOR IV PUSH (FOR BLOOD PRESSURE SUPPORT)
PREFILLED_SYRINGE | INTRAVENOUS | Status: DC | PRN
  Administered 2022-06-27 (×2): 160 ug via INTRAVENOUS
  Administered 2022-06-27: 80 ug via INTRAVENOUS
  Administered 2022-06-27: 160 ug via INTRAVENOUS

## 2022-06-27 MED ORDER — SODIUM CHLORIDE 0.9 % IV SOLN: INTRAVENOUS | Status: DC

## 2022-06-27 MED ORDER — MIDAZOLAM HCL 2 MG/2ML IJ SOLN
INTRAMUSCULAR | Status: AC
  Filled 2022-06-27: qty 2

## 2022-06-27 MED ORDER — GLUCAGON HCL RDNA (DIAGNOSTIC) 1 MG IJ SOLR
INTRAMUSCULAR | Status: AC
  Filled 2022-06-27: qty 1

## 2022-06-27 MED ORDER — LIDOCAINE 2% (20 MG/ML) 5 ML SYRINGE
INTRAMUSCULAR | Status: DC | PRN
  Administered 2022-06-27: 60 mg via INTRAVENOUS

## 2022-06-27 MED ORDER — PHENYLEPHRINE HCL-NACL 20-0.9 MG/250ML-% IV SOLN
INTRAVENOUS | Status: DC | PRN
  Administered 2022-06-27: 50 ug/min via INTRAVENOUS

## 2022-06-27 MED ORDER — IOPAMIDOL (ISOVUE-300) INJECTION 61%
INTRAVENOUS | Status: DC | PRN
  Administered 2022-06-27: 10 mL

## 2022-06-27 MED ORDER — SUGAMMADEX SODIUM 200 MG/2ML IV SOLN
INTRAVENOUS | Status: DC | PRN
  Administered 2022-06-27: 200 mg via INTRAVENOUS

## 2022-06-27 MED ORDER — ONDANSETRON HCL 4 MG/2ML IJ SOLN
INTRAMUSCULAR | Status: DC | PRN
  Administered 2022-06-27: 4 mg via INTRAVENOUS

## 2022-06-27 MED ORDER — ROCURONIUM BROMIDE 10 MG/ML (PF) SYRINGE
PREFILLED_SYRINGE | INTRAVENOUS | Status: DC | PRN
  Administered 2022-06-27: 60 mg via INTRAVENOUS

## 2022-06-27 MED ORDER — INDOMETHACIN 50 MG RE SUPP
RECTAL | Status: AC
  Filled 2022-06-27: qty 2

## 2022-06-27 MED ORDER — CIPROFLOXACIN IN D5W 400 MG/200ML IV SOLN
INTRAVENOUS | Status: DC | PRN
  Administered 2022-06-27: 400 mg via INTRAVENOUS

## 2022-06-27 MED ORDER — OMEPRAZOLE 40 MG PO CPDR: 40.0000 mg | DELAYED_RELEASE_CAPSULE | Freq: Every day | ORAL | 2 refills | Status: DC

## 2022-06-27 MED ORDER — MIDAZOLAM HCL 5 MG/5ML IJ SOLN
INTRAMUSCULAR | Status: DC | PRN
  Administered 2022-06-27: 2 mg via INTRAVENOUS

## 2022-06-27 NOTE — Anesthesia Postprocedure Evaluation (Signed)
Anesthesia Post Note  Patient: Zachary [name]  Procedure(s) Performed: ENDOSCOPIC RETROGRADE CHOLANGIOPANCREATOGRAPHY (ERCP) WITH PROPOFOL     Patient location during evaluation: PACU Anesthesia Type: General Level of consciousness: awake and alert Pain management: pain level controlled Vital Signs Assessment: post-procedure vital signs reviewed and stable Respiratory status: spontaneous breathing, nonlabored ventilation and respiratory function stable Cardiovascular status: blood pressure returned to baseline and stable Postop Assessment: no apparent nausea or vomiting Anesthetic complications: no   No notable events documented.  Last Vitals:  Vitals:   06/27/22 1109 06/27/22 1119  BP: (!) 163/96 (!) 156/100  Pulse: 64 (!) 55  Resp: 17 15  Temp:    SpO2: 94% 96%    Last Pain:  Vitals:   06/27/22 1119  TempSrc:   PainSc: 0-No pain                 Lynda Rainwater

## 2022-06-27 NOTE — Anesthesia Preprocedure Evaluation (Signed)
Anesthesia Evaluation  Patient identified by MRN, date of birth, ID band Patient awake    Reviewed: Allergy & Precautions, NPO status , Patient's Chart, lab work & pertinent test results  Airway Mallampati: II  TM Distance: >3 FB Neck ROM: Full    Dental no notable dental hx.    Pulmonary asthma , former smoker,    Pulmonary exam normal breath sounds clear to auscultation       Cardiovascular negative cardio ROS Normal cardiovascular exam Rhythm:Regular Rate:Normal     Neuro/Psych PSYCHIATRIC DISORDERS Anxiety Depression    GI/Hepatic Neg liver ROS, GERD  ,Hx noted Dr. Nyoka Cowden   Endo/Other  negative endocrine ROS  Renal/GU negative Renal ROS     Musculoskeletal   Abdominal (+) + obese,   Peds  Hematology   Anesthesia Other Findings   Reproductive/Obstetrics                             Anesthesia Physical  Anesthesia Plan  ASA: 3  Anesthesia Plan: General   Post-op Pain Management: Minimal or no pain anticipated   Induction: Intravenous  PONV Risk Score and Plan: 1 and Treatment may vary due to age or medical condition  Airway Management Planned: Oral ETT  Additional Equipment:   Intra-op Plan:   Post-operative Plan: Extubation in OR  Informed Consent:   Plan Discussed with: Anesthesiologist  Anesthesia Plan Comments:         Anesthesia Quick Evaluation

## 2022-06-27 NOTE — Transfer of Care (Signed)
Immediate Anesthesia Transfer of Care Note  Patient: HILMAN KISSLING  Procedure(s) Performed: ENDOSCOPIC RETROGRADE CHOLANGIOPANCREATOGRAPHY (ERCP) WITH PROPOFOL  Patient Location: PACU  Anesthesia Type:General  Level of Consciousness: sedated  Airway & Oxygen Therapy: Patient Spontanous Breathing and Patient connected to face mask oxygen  Post-op Assessment: Report given to RN and Post -op Vital signs reviewed and stable  Post vital signs: Reviewed and stable  Last Vitals:  Vitals Value Taken Time  BP 179/84 06/27/22 1040  Temp    Pulse 51 06/27/22 1039  Resp 14 06/27/22 1039  SpO2 100 % 06/27/22 1039  Vitals shown include unvalidated device data.  Last Pain:  Vitals:   06/27/22 0737  TempSrc: Temporal  PainSc: 0-No pain         Complications: No notable events documented.

## 2022-06-27 NOTE — Discharge Instructions (Signed)
YOU HAD AN ENDOSCOPIC PROCEDURE TODAY: Refer to the procedure report and other information in the discharge instructions given to you for any specific questions about what was found during the examination. If this information does not answer your questions, please call [place] office at [phone] to clarify.   YOU SHOULD EXPECT: Some feelings of bloating in the abdomen. Passage of more gas than usual. Walking can help get rid of the air that was put into your GI tract during the procedure and reduce the bloating. If you had a lower endoscopy (such as a colonoscopy or flexible sigmoidoscopy) you may notice spotting of blood in your stool or on the toilet paper. Some abdominal soreness may be present for a day or two, also.  DIET: Your first meal following the procedure should be a light meal and then it is ok to progress to your normal diet. A half-sandwich or bowl of soup is an example of a good first meal. Heavy or fried foods are harder to digest and may make you feel nauseous or bloated. Drink plenty of fluids but you should avoid alcoholic beverages for 24 hours. If you had a esophageal dilation, please see attached instructions for diet.    ACTIVITY: Your care partner should take you home directly after the procedure. You should plan to take it easy, moving slowly for the rest of the day. You can resume normal activity the day after the procedure however YOU SHOULD NOT DRIVE, use power tools, machinery or perform tasks that involve climbing or major physical exertion for 24 hours (because of the sedation medicines used during the test).   SYMPTOMS TO REPORT IMMEDIATELY: A gastroenterologist can be reached at any hour. Please call [phone]  for any of the following symptoms:   Following upper endoscopy (EGD, EUS, ERCP, esophageal dilation) Vomiting of blood or coffee ground material  New, significant abdominal pain  New, significant chest pain or pain under the shoulder blades  Painful or  persistently difficult swallowing  New shortness of breath  Black, tarry-looking or red, bloody stools  FOLLOW UP:  If any biopsies were taken you will be contacted by phone or by letter within the next 1-3 weeks. Call [phone]  if you have not heard about the biopsies in 3 weeks.  Please also call with any specific questions about appointments or follow up tests.  

## 2022-06-27 NOTE — Anesthesia Procedure Notes (Signed)
Procedure Name: Intubation Date/Time: 06/27/2022 9:25 AM  Performed by: Talbot Grumbling, CRNAPre-anesthesia Checklist: Patient identified, Emergency Drugs available, Suction available and Patient being monitored Patient Re-evaluated:Patient Re-evaluated prior to induction Oxygen Delivery Method: Circle system utilized Preoxygenation: Pre-oxygenation with 100% oxygen Induction Type: IV induction Ventilation: Mask ventilation without difficulty Laryngoscope Size: Mac and 3 Tube type: Oral Tube size: 7.5 mm Number of attempts: 1 Airway Equipment and Method: Stylet and Oral airway Placement Confirmation: ETT inserted through vocal cords under direct vision, positive ETCO2 and breath sounds checked- equal and bilateral Secured at: 20 cm Tube secured with: Tape Dental Injury: Teeth and Oropharynx as per pre-operative assessment

## 2022-06-27 NOTE — Op Note (Signed)
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 Estimated Blood Loss:     Estimated blood loss: none. Procedure:                Pre-Anesthesia Assessment:                           - Prior to the procedure, a History and Physical                            was performed, and patient medications and                            allergies were reviewed. The patient's tolerance of                            previous anesthesia was also reviewed. The risks                            and benefits of the procedure and the sedation                            options and risks were discussed with the patient.                            All questions were answered, and informed consent                            was obtained. Prior Anticoagulants: The patient has  taken no previous anticoagulant or antiplatelet                            agents. ASA Grade Assessment: II - A patient with                            mild systemic disease. After reviewing the risks                            and benefits, the patient was deemed in                            satisfactory condition to undergo the procedure.                            After obtaining informed consent, the scope was                            passed under direct vision. Throughout the                            procedure, the patient's blood pressure, pulse, and                            oxygen saturations were monitored continuously. The                            TJF-Q190V ([id number]) Olympus duodenoscope was                            introduced through the mouth, The procedure was                            aborted. The scope was not inserted. bile &                            pancreatic ducts Medications were bile duct alone.                            The ERCP was technically difficult and complex due                            to challenging cannulation. Successful completion                            of the procedure was aided by performing the                            maneuvers documented (below) in this report. The                            patient tolerated the procedure. Scope In: Scope Out: Findings:      The scout film was normal.      The  esophagus was successfully intubated under direct vision without       detailed examination of the pharynx, larynx, and associated structures,       and upper GI tract. 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 The major papilla was similarly       partially ulcerated as it had been on recent EUS.      The bile duct could not be cannulated with the Hydratome sphincterotome       and Jagtome sphincterotome. We trialed multiple wires including angled       wires without ability to access the biliary or pancreatic orifices. We       went into long and semilong positions (these were better than short       position) but still did not have ability to access either orifice/tree.       We proceeded with a superficial cannulation of the ampullary os and       contrast injection into the very distal ventral pancreatic duct alone       was accomplished  with the Jagtome sphincterotome but I could not       visualize a more significant pancreatogram or a biliary ductogram. After       more than an hour of trying, I decided to end the procedure to decrease       risk of postprocedural complications.      The duodenoscope was withdrawn from the patient. Impression:               - The major papilla (known adenoma) is present with                            slight ulceration (unchanged from previous exam).                           - As documented, unfortunately unable to access the                            biliary or pancreatic orifices for cannulation in                            an effort of trying to pursue sphincterotomies and                            subsequent ampullectomy.                           - At one point, I considered trying to just go                            ahead and resect the lesion without entering either                            orifice and placed a hexagonal 15 mm snare around                            the ampullary region. This looks like it may be  accessible and possible, if repeat attempt at                            access of the pancreatic/biliary orifice this is                            not able to be achieved. My typical resection                            technique allows me to at least enter 1 or both of                            these orifices and perform sphincterotomies before                            resection so that we can protect these orifices is                            much as possible. Thus a reason I did not pursue                            removal today. Moderate Sedation:      Not Applicable - Patient had care per Anesthesia. Recommendation:           - The patient will be observed post-procedure,                            until all discharge criteria are met.                           - Discharge patient to home.                           -  Patient has a contact number available for                            emergencies. The signs and symptoms of potential                            delayed complications were discussed with the                            patient. Return to normal activities tomorrow.                            Written discharge instructions were provided to the                            patient.                           - Low fat diet for 2 weeks.                           -  Watch for pancreatitis, bleeding, perforation,                            and cholangitis.                           - Recommend referral to advanced endoscopy [facility] based on insurance approval                            for repeat attempt at endoscopic ultrasound and                            endoscopic ampullectomy; if not successful or                            possible then will need surgical ampullectomy.                           - The findings and recommendations were discussed                            with the patient.                           - The findings and recommendations were discussed                            with the patient's family. 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 All rights reserved. The codes documented in this report are preliminary and upon coder review may  be revised to meet current compliance requirements. Justice Britain,  MD 06/27/2022 10:50:45 AM Number of Addenda: 0

## 2022-06-27 NOTE — H&P (Signed)
GASTROENTEROLOGY PROCEDURE H&P NOTE   Primary Care Physician: Elsie Stain, MD  HPI: Zachary [name] is a 61 y.o. 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  Past Medical History:  Diagnosis Date   Asthma    COVID-19 virus infection 09/15/2020   Diverticulosis    GERD (gastroesophageal reflux disease)    Knee pain    Past Surgical History:  Procedure Laterality Date   BIOPSY  06/07/2022   Procedure: BIOPSY;  Surgeon: Irving Copas., MD;  Location: [facility];  Service: Gastroenterology;;   ESOPHAGOGASTRODUODENOSCOPY (EGD) WITH PROPOFOL N/A 06/07/2022   Procedure: ESOPHAGOGASTRODUODENOSCOPY (EGD) WITH PROPOFOL;  Surgeon: Irving Copas., MD;  Location: [facility];  Service: Gastroenterology;  Laterality: N/A;   EUS N/A 06/07/2022   Procedure: UPPER ENDOSCOPIC ULTRASOUND (EUS) RADIAL;  Surgeon: Irving Copas., MD;  Location: [facility];  Service: Gastroenterology;  Laterality: N/A;   KNEE ARTHROSCOPY     x 2   RADIOLOGY WITH ANESTHESIA N/A 03/28/2022   Procedure: MRI ABDOMEN MRCP WITH AND WITHOUT CONTRAST WITH ANESTHESIA;  Surgeon: Radiologist, Medication, MD;  Location: [place];  Service: Radiology;  Laterality: N/A;   UPPER GASTROINTESTINAL ENDOSCOPY  12/29/2021   Dr.Ying Lorenso Courier   Current Facility-Administered Medications  Medication Dose Route Frequency Provider Last Rate Last Admin   0.9 %  sodium chloride infusion   Intravenous Continuous Mansouraty, Telford Nab., MD        Current Facility-Administered Medications:    0.9 %  sodium chloride infusion, , Intravenous, Continuous, Mansouraty, Telford Nab., MD No Known Allergies Family History  Problem Relation Age of Onset   Hypertension Mother    Cancer Father    Colon cancer Maternal Uncle    Colon cancer Cousin    Cancer Other    Esophageal cancer Neg  Hx    Inflammatory bowel disease Neg Hx    Liver disease Neg Hx    Pancreatic cancer Neg Hx    Rectal cancer Neg Hx    Stomach cancer Neg Hx    Social History   Socioeconomic History   Marital status: Single    Spouse name: Not on file   Number of children: Not on file   Years of education: Not on file   Highest education level: Not on file  Occupational History   Not on file  Tobacco Use   Smoking status: Former   Smokeless tobacco: Never  Vaping Use   Vaping Use: Never used  Substance and Sexual Activity   Alcohol use: No   Drug use: No   Sexual activity: Yes  Other Topics Concern   Not on file  Social History Narrative   Not on file   Social Determinants of Health   Financial Resource Strain: Not on file  Food Insecurity: Not on file  Transportation Needs: Not on file  Physical Activity: Not on file  Stress: Not on file  Social Connections: Not on file  Intimate Partner Violence: Not on file    Physical Exam: Today's Vitals   06/27/22 0737  BP: (!) 147/90  Pulse: 68  Resp: (!) 65  Temp: (!) 97.3 F (36.3 C)  TempSrc: Temporal  SpO2: 94%  Weight: (!) 669.1 kg  Height: '5\' 6"'$  (1.676 m)  PainSc: 0-No pain   Body mass index is 238.07 kg/m. GEN: NAD EYE: Sclerae anicteric ENT: MMM CV: Non-tachycardic GI:  Soft, NT/ND NEURO:  Alert & Oriented x 3  Lab Results: No results for input(s): "WBC", "HGB", "HCT", "PLT" in the last 72 hours. BMET No results for input(s): "NA", "K", "CL", "CO2", "GLUCOSE", "BUN", "CREATININE", "CALCIUM" in the last 72 hours. LFT No results for input(s): "PROT", "ALBUMIN", "AST", "ALT", "ALKPHOS", "BILITOT", "BILIDIR", "IBILI" in the last 72 hours. PT/INR No results for input(s): "LABPROT", "INR" in the last 72 hours.   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  The risks of an ERCP were discussed at length, including but not limited to the risk of perforation, bleeding, abdominal pain, post-ERCP pancreatitis (while usually mild can be severe and even life threatening).  The risks of endoscopic ampullectomy can be upwards of 10% bleeding/pancreatitis/perforation risk.  Will will mitigate these risks as much as possible.  The risks and benefits of endoscopic evaluation/treatment were discussed with the patient and/or family; these include but are not limited to the risk of perforation, infection, bleeding, missed lesions, lack of diagnosis, severe illness requiring hospitalization, as well as anesthesia and sedation related illnesses.  The patient's history has been reviewed, patient examined, no change in status, and deemed stable for procedure.  The patient and/or family is agreeable to proceed.    Justice Britain, MD [name] Office # [phone]

## 2022-06-30 ENCOUNTER — Telehealth: Payer: Self-pay

## 2022-06-30 NOTE — Telephone Encounter (Signed)
Appt made with Dr Lorenso Courier for 10/31  Referral made to [facility] per pt preference.

## 2022-06-30 NOTE — Telephone Encounter (Signed)
-----   Message from Irving Copas., MD sent at 06/30/2022  4:37 AM EDT ----- Regarding: Follow-up Zachary [name], Please schedule this patient a follow-up with Dr. Lorenso Courier or one of the APP's in the coming weeks for follow-up of his GI symptoms and abdominal pain.  Patient will also need a referral to [facility] or [facility] or [facility] for repeat attempt at ampullary adenoma resection via endoscopic approach with possible need for EUS as well.  Please place this as expedited, his insurance will be changing before by the end of the year so hopefully can be seen before then.  Thanks. GM   FYI CD about patient you had referred to me.  We can talk further offline as necessary. GM

## 2022-07-02 ENCOUNTER — Encounter (HOSPITAL_COMMUNITY): Payer: Self-pay | Admitting: Gastroenterology

## 2022-07-11 ENCOUNTER — Encounter: Payer: Self-pay | Admitting: Internal Medicine

## 2022-07-11 ENCOUNTER — Ambulatory Visit (INDEPENDENT_AMBULATORY_CARE_PROVIDER_SITE_OTHER): Payer: Commercial Managed Care - HMO | Admitting: Internal Medicine

## 2022-07-11 VITALS — BP 130/80 | HR 66 | Ht 66.0 in | Wt 144.8 lb

## 2022-07-11 DIAGNOSIS — K219 Gastro-esophageal reflux disease without esophagitis: Secondary | ICD-10-CM

## 2022-07-11 DIAGNOSIS — D135 Benign neoplasm of extrahepatic bile ducts: Secondary | ICD-10-CM

## 2022-07-11 NOTE — Progress Notes (Signed)
Chief Complaint: Abdominal pain  HPI : 61 year old male with history of asthma and knee pain presents for follow up of abdominal pain and ampullary adenoma  Interval History: He still has some burning in the upper abdomen. He stopped taking the omeprazole because it was making him woozy. The omeprazole did help with the burning sensation. He is using two OTC pills currently to help with the burning discomfort. He has been coughing so he has been using Mucinex. He is eating well.   Wt Readings from Last 3 Encounters:  07/11/22 144 lb 12.8 oz (65.7 kg)  06/27/22 146 lb (66.2 kg)  06/07/22 155 lb (70.3 kg)   Current Outpatient Medications  Medication Sig Dispense Refill   albuterol (VENTOLIN HFA) 108 (90 Base) MCG/ACT inhaler Inhale 2 puffs into the lungs every 6 (six) hours as needed for wheezing or shortness of breath (cough). 18 g 0   alum & mag hydroxide-simeth (MAALOX/MYLANTA) 200-200-20 MG/5ML suspension Take 15 mLs by mouth every 6 (six) hours as needed (Stomach issure).     sucralfate (CARAFATE) 1 g tablet Take 1 tablet (1 g total) by mouth 4 (four) times daily -  with meals and at bedtime. (Patient taking differently: Take 1 g by mouth 4 (four) times daily as needed (stomach issues.).) 30 tablet 0   No current facility-administered medications for this visit.   Physical Exam: BP 130/80 (BP Location: Left Arm, Patient Position: Sitting, Cuff Size: Normal)   Pulse 66   Ht '5\' 6"'$  (1.676 m)   Wt 144 lb 12.8 oz (65.7 kg)   SpO2 99%   BMI 23.37 kg/m  Constitutional: Pleasant,well-developed, male in no acute distress. HEENT: Normocephalic and atraumatic. Conjunctivae are normal. No scleral icterus. Cardiovascular: Normal rate, regular rhythm.  Pulmonary/chest: Effort normal and breath sounds normal. No wheezing, rales or rhonchi. Abdominal: Soft, nondistended, mildly tender over the upper abdomen Extremities: No edema Neurological: Alert and oriented to person place and  time. Skin: Skin is warm and dry. No rashes noted. Psychiatric: Normal mood and affect. Behavior is normal.  Labs 03/2020: FOBT negative.  Labs 11/2021: Lipase mildly elevated at 56.  Labs 12/2021: CMP unremarkable. CBC with mildly low WBC of 3.3. UDS negative. Troponin negative. HCV antibody and HIV negative.  Ab U/S 12/01/21: IMPRESSION: 1. No acute abnormality to account for the patient's symptoms. Normal abdominal ultrasound  CTA C/A/P w/contrast 12/24/21: IMPRESSION: Vascular: 1. No findings of aortic dissection or aneurysm. Nonvascular: 1. No acute findings in the chest. 2. No acute findings in the abdomen or pelvis  MRI/MRCP 03/28/22: IMPRESSION: No suspicious mass or lymphadenopathy identified  EGD 12/29/21: - Salmon-colored mucosa suspicious for Barrett's esophagus. Biopsied. - Gastritis. Biopsied. - A single duodenal polyp. Biopsied. - Biopsies were taken with a cold forceps for histology in the duodenal bulb and in the second portion of the duodenum. Path: 1. Surgical [P], duodenal - PEPTIC DUODENITIS - NO DYSPLASIA OR MALIGNANCY IDENTIFIED 2. Surgical [P], duodenal polyp - DUODENAL ADENOMA WITH LOW-GRADE DYSPLASIA - SEE COMMENT 3. Surgical [P], gastric - CHRONIC GASTRITIS WITH REACTIVE CHANGES AND FOCAL ACTIVITY - NO INTESTINAL METAPLASIA IDENTIFIED - SEE COMMENT 4. Surgical [P], GE injection - SQUAMOCOLUMNAR JUNCTION WITH ACUTE AND CHRONIC INFLAMMATION - NO INTESTINAL METAPLASIA, DYSPLASIA OR MALIGNANCY IDENTIFIED  Colonoscopy 02/23/22: - The examined portion of the ileum was normal. - Three 3 to 5 mm polyps in the sigmoid colon, in the descending colon and in the transverse colon, removed with a cold snare. Resected  and retrieved. - Diverticulosis in the sigmoid colon and in the descending colon. - Non-bleeding internal hemorrhoids. Path: Surgical [P], colon, descending and transverse, sigmoid, polyp (3) - TUBULAR ADENOMA. - HYPERPLASTIC POLYPS.  EUS  06/07/22: EGD impression: - No gross lesions in esophagus. Z-line irregular, 40 cm from the incisors. - No gross lesions in the stomach. - Polypoid lesion with ulceration noted at the major papilla. Rule out malignancy, duodenal mass. Biopsied after completion of EUS. - No other gross lesions in the duodenal bulb, in the first portion of the duodenum and in the second portion of the duodenum. EUS impression - A lesion was found at the ampulla. A tissue diagnosis was obtained prior to this exam with just being an adenoma. However as noted above, there are some findings that are a bit more concerning as to whether more invasive and this could be present. This was not removed today. - The pancreatic duct had a very minimally prominent endosonographic appearance in the pancreatic head with a normal PD diameter throughout the rest of the pancreas. - Pancreatic parenchymal abnormalities consisting of lobularity and hyperechoic strands were noted in the entire pancreas. - There was no sign of significant pathology in the common bile duct and in the common hepatic duct. - No malignant-appearing lymph nodes were visualized in the celiac region (level 20), peripancreatic region and porta hepatis region. Path: A. AMPULLARY MASS, BIOPSY:  - Ampullary adenoma (low-grade dysplasia)  - Negative for high-grade dysplasia or carcinoma in the submitted  material  ERCP 06/27/22: - The major papilla (known adenoma) is present with slight ulceration (unchanged from previous exam). - As documented, unfortunately unable to access the biliary or pancreatic orifices for cannulation in an effort of trying to pursue sphincterotomies and subsequent ampullectomy. - At one point, I considered trying to just go ahead and resect the lesion without entering either orifice and placed a hexagonal 15 mm snare around the ampullary region. This looks like it may be accessible and possible, if repeat attempt at access of the  pancreatic/biliary orifice this is not able to be achieved. My typical resection technique allows me to at least enter 1 or both of these orifices and perform sphincterotomies before resection so that we can protect these orifices is much as possible. Thus a reason I did not pursue removal today.  ASSESSMENT AND PLAN: Upper abdominal pain GERD Colon cancer screening Family history of colon cancer Patient presents for follow up of upper abdominal pain. 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 He took omeprazole for a period of time that seemed to help with his ab pain, but he discontinued this medication after it made him lightheaded. He currently declines switching to another PPI therapy and prefers to rely on PRN Pepto Bismol at this time. I told him that he could also try Pepcid to see if this helps with his symptoms. Patient was not able to get his ampullary adenoma removed by Dr. Rush Landmark due to difficult cannulation. He was referred to [facility] to see the status of his referral for removal of ampullary adenoma, but we will need to fax in another referral after speaking with the [facility]. I told the patient to contact us if he doesn't hear anything back from [facility] in a few weeks - Previously received GERD friendly diet handout - Declined PPI for now - Okay to use Pepcid 20 mg BID PRN for upper ab pain and GERD - Will place  another referral to [facility] for removal of ampullary adenoma - RTC 3 months  [name], MD  I spent 37 minutes of time, including in depth chart review, independent review of results as outlined above, communicating results with the patient directly, face-to-face time with the patient, coordinating care, and ordering studies and medications as appropriate, and documentation.

## 2022-07-11 NOTE — Patient Instructions (Addendum)
If you are age 60 or younger, your body mass index should be between 19-25. Your Body mass index is 23.37 kg/m. If this is out of the aformentioned range listed, please consider follow up with your Primary Care Provider.  ________________________________________________________  The [facility] providers would like to encourage you to use [facility] to communicate with providers for non-urgent requests or questions.  Due to long hold times on the telephone, sending your provider a message by [facility] may be a faster and more efficient way to get a response.  Please allow 48 business hours for a response.  Please remember that this is for non-urgent requests.  _______________________________________________________  Dennis Bast can use Pepcid '20mg'$  one tablet twice daily as needed for burning stomach and chest.  We will attempt to get you scheduled at [facility].  Please contact our office if you have no heard from [place] in 2 to 3 weeks.  Thank you for entrusting me with your care and choosing [facility].  Dr Lorenso Courier

## 2022-07-18 NOTE — Telephone Encounter (Signed)
I spoke with the [facility] and confirmed they received the referral.  It is under review.  They state that it can take up to 2 weeks for the appt.  I did call and make the pt aware.  He was told to call back if he has not heard from them in 2 weeks.  The pt has been advised of the information and verbalized understanding.

## 2022-07-18 NOTE — Telephone Encounter (Signed)
Inbound call from patient f/u on [facility] referral. Please advise.  Thank you

## 2022-08-09 ENCOUNTER — Telehealth: Payer: Self-pay | Admitting: Internal Medicine

## 2022-08-09 NOTE — Telephone Encounter (Addendum)
I called to [facility] at [phone] and spoke with [facility] who stated the referral had been received, but needs to be reviewed by a clinician.  Maya stated this could take up to 14 business days and with the holidays it could be longer.    Patient is aware that someone from their office would be calling with an appointment.  Patient was office this office phone number so that he could call and check on the referral, but he said he would continue to call our office to check on the status if he has not heard from [place] in several days.  No further questions.

## 2022-08-09 NOTE — Telephone Encounter (Signed)
Inbound call from patient stating that he has not heard anything from [facility] and is wanting ot know If there is anyway we could reach out to them to see if we can get him scheduled and let  him know. Please advise.

## 2022-08-21 ENCOUNTER — Ambulatory Visit
Admission: EM | Admit: 2022-08-21 | Discharge: 2022-08-21 | Disposition: A | Discharge status: Home / Self Care | Payer: Commercial Managed Care - HMO | Attending: Nurse Practitioner | Admitting: Nurse Practitioner

## 2022-08-21 ENCOUNTER — Encounter: Payer: Self-pay | Admitting: Emergency Medicine

## 2022-08-21 DIAGNOSIS — A6001 Herpesviral infection of penis: Secondary | ICD-10-CM

## 2022-08-21 DIAGNOSIS — J209 Acute bronchitis, unspecified: Secondary | ICD-10-CM | POA: Diagnosis not present

## 2022-08-21 MED ORDER — VALACYCLOVIR HCL 1 G PO TABS: 1000.0000 mg | ORAL_TABLET | Freq: Two times a day (BID) | ORAL | 0 refills | Status: AC

## 2022-08-21 MED ORDER — BENZONATATE 200 MG PO CAPS: 200.0000 mg | ORAL_CAPSULE | Freq: Three times a day (TID) | ORAL | 0 refills | Status: DC | PRN

## 2022-08-21 NOTE — Discharge Instructions (Signed)
Tessalon as needed for cough Valtrex as prescribed Follow-up with PCP 2 to 3 days for recheck Please go to the ER for any worsening symptoms I hope you feel better soon!

## 2022-08-21 NOTE — ED Provider Notes (Signed)
[facility]    CSN: [id number] Arrival date & time: 08/21/22  1344      History   Chief Complaint Chief Complaint  Patient presents with   Cough    congestion   penile bump    HPI Zachary [name] is a 61 y.o. male presents for evaluation of cough and general source.  Patient reports 2 weeks of a improving initially productive but now dry cough.  Denies fevers, sore throat, ear pain, body aches, shortness of breath.  He does have a history of asthma and has an albuterol inhaler.  He has not had to use his inhaler since symptoms began.  He is vaccinated for flu and COVID.  He has been taking Mucinex.  In addition he reports 2 days of painful genital sores on his penis.  He does have a history of HSV and states his current symptoms are the same as previous outbreaks.  Denies any STD concern at this time.  No dysuria.  No other concerns at this time.   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   Generalized abdominal pain 04/07/2022   Anxiety and depression 12/27/2021   Abdominal pain, acute, epigastric 12/06/2021   Elevated lipase 12/06/2021   Allergic rhinitis 11/15/2020   Inguinal hernia of right side without obstruction or gangrene 03/17/2020   Periodontal disease 03/17/2020   Dental caries 03/17/2020   Mild intermittent asthma without complication 73/22/0254   Gastroesophageal reflux disease without esophagitis 03/17/2020   Chronic pain of right knee 03/17/2020    Past Surgical History:  Procedure Laterality Date   BIOPSY  06/07/2022   Procedure: BIOPSY;  Surgeon: Irving Copas., MD;  Location: [facility];  Service: Gastroenterology;;    ENDOSCOPIC RETROGRADE CHOLANGIOPANCREATOGRAPHY (ERCP) WITH PROPOFOL N/A 06/27/2022   Procedure: ENDOSCOPIC RETROGRADE CHOLANGIOPANCREATOGRAPHY (ERCP) WITH PROPOFOL;  Surgeon: Irving Copas., MD;  Location: [facility];  Service: Gastroenterology;  Laterality: N/A;   ESOPHAGOGASTRODUODENOSCOPY (EGD) WITH PROPOFOL N/A 06/07/2022   Procedure: ESOPHAGOGASTRODUODENOSCOPY (EGD) WITH PROPOFOL;  Surgeon: Rush Landmark Telford Nab., MD;  Location: [facility];  Service: Gastroenterology;  Laterality: N/A;   EUS N/A 06/07/2022   Procedure: UPPER ENDOSCOPIC ULTRASOUND (EUS) RADIAL;  Surgeon: Irving Copas., MD;  Location: [facility];  Service: Gastroenterology;  Laterality: N/A;   KNEE ARTHROSCOPY     x 2   RADIOLOGY WITH ANESTHESIA N/A 03/28/2022   Procedure: MRI ABDOMEN MRCP WITH AND WITHOUT CONTRAST WITH ANESTHESIA;  Surgeon: Radiologist, Medication, MD;  Location: [facility];  Service: Radiology;  Laterality: N/A;   UPPER GASTROINTESTINAL ENDOSCOPY  12/29/2021   [place] Medications    Prior to Admission medications   Medication Sig Start Date End Date Taking? Authorizing Provider  benzonatate (TESSALON) 200 MG capsule Take 1 capsule (200 mg total) by mouth 3 (three) times daily as needed for cough. 08/21/22  Yes Melynda Ripple, NP  valACYclovir (VALTREX) 1000 MG tablet Take 1 tablet (1,000 mg total) by mouth 2 (two) times daily for 7 days. 08/21/22 08/28/22 Yes Melynda Ripple, NP  albuterol (VENTOLIN HFA) 108 (90 Base) MCG/ACT inhaler Inhale 2 puffs into  the lungs every 6 (six) hours as needed for wheezing or shortness of breath (cough). 09/15/20   Elsie Stain, MD  alum & mag hydroxide-simeth (MAALOX/MYLANTA) 200-200-20 MG/5ML suspension Take 15 mLs by mouth every 6 (six) hours as needed (Stomach issure).    [provider]  sucralfate (CARAFATE) 1 g tablet Take 1 tablet (1 g total) by mouth 4 (four) times daily -  with meals and at bedtime. Patient  taking differently: Take 1 g by mouth 4 (four) times daily as needed (stomach issues.). 12/24/21   Tegeler, Gwenyth Allegra, MD    Family History Family History  Problem Relation Age of Onset   Hypertension Mother    Cancer Father    Colon cancer Maternal Uncle    Colon cancer Cousin    Cancer Other    Esophageal cancer Neg Hx    Inflammatory bowel disease Neg Hx    Liver disease Neg Hx    Pancreatic cancer Neg Hx    Rectal cancer Neg Hx    Stomach cancer Neg Hx     Social History Social History   Tobacco Use   Smoking status: Former   Smokeless tobacco: Never  Scientific laboratory technician Use: Never used  Substance Use Topics   Alcohol use: No   Drug use: No     Allergies   Patient has no known allergies.   Review of Systems Review of Systems  Respiratory:  Positive for cough.   Genitourinary:  Positive for genital sores.     Physical Exam Triage Vital Signs ED Triage Vitals  Enc Vitals Group     BP 08/21/22 1506 132/89     Pulse Rate 08/21/22 1506 64     Resp 08/21/22 1506 18     Temp 08/21/22 1506 97.9 F (36.6 C)     Temp src --      SpO2 08/21/22 1506 96 %     Weight --      Height --      Head Circumference --      Peak Flow --      Pain Score 08/21/22 1505 0     Pain Loc --      Pain Edu? --      Excl. in [place]? --    No data found.  Updated Vital Signs BP 132/89   Pulse 64   Temp 97.9 F (36.6 C)   Resp 18   SpO2 96%   Visual Acuity Right Eye Distance:   Left Eye Distance:   Bilateral Distance:    Right Eye Near:   Left Eye Near:    Bilateral Near:     Physical Exam Vitals and nursing note reviewed.  Constitutional:      General: He is not in acute distress.    Appearance: Normal appearance. He is not ill-appearing or toxic-appearing.  HENT:     Head: Normocephalic and atraumatic.     Right Ear: Tympanic membrane and ear canal normal.     Left Ear: Tympanic membrane and ear canal normal.     Mouth/Throat:     Mouth: Mucous  membranes are moist.     Pharynx: No posterior oropharyngeal erythema.  Eyes:     Pupils: Pupils are equal, round, and reactive to light.  Cardiovascular:     Rate and Rhythm: Normal rate and regular rhythm.     Heart sounds: Normal heart sounds.  Pulmonary:     Effort: Pulmonary effort is normal.  Breath sounds: Normal breath sounds.  Genitourinary:    Comments: Declined genital exam  Musculoskeletal:     Cervical back: Normal range of motion and neck supple.  Lymphadenopathy:     Cervical: No cervical adenopathy.  Skin:    General: Skin is warm and dry.  Neurological:     General: No focal deficit present.     Mental Status: He is alert and oriented to person, place, and time.  Psychiatric:        Mood and Affect: Mood normal.        Behavior: Behavior normal.      UC Treatments / Results  Labs (all labs ordered are listed, but only abnormal results are displayed) Labs Reviewed - No data to display  EKG   Radiology No results found.  Procedures Procedures (including critical care time)  Medications Ordered in UC Medications - No data to display  Initial Impression / Assessment and Plan / UC Course  I have reviewed the triage vital signs and the nursing notes.  Pertinent labs & imaging results that were available during my care of the patient were reviewed by me and considered in my medical decision making (see chart for details).     Start Tessalon for cough.  Discussed viral bronchitis and given his symptoms are improving do not feel additional treatment is necessary at this time Start Valtrex for HSV outbreak Follow-up with PCP 2 to 3 days for recheck ER precautions reviewed and patient verbalized understanding Final Clinical Impressions(s) / UC Diagnoses   Final diagnoses:  Acute bronchitis, unspecified organism  Herpes simplex infection of penis     Discharge Instructions      Tessalon as needed for cough Valtrex as prescribed Follow-up  with PCP 2 to 3 days for recheck Please go to the ER for any worsening symptoms I hope you feel better soon!   ED Prescriptions     Medication Sig Dispense Auth. Provider   benzonatate (TESSALON) 200 MG capsule Take 1 capsule (200 mg total) by mouth 3 (three) times daily as needed for cough. 20 capsule Melynda Ripple, NP   valACYclovir (VALTREX) 1000 MG tablet Take 1 tablet (1,000 mg total) by mouth 2 (two) times daily for 7 days. 14 tablet Melynda Ripple, NP      PDMP not reviewed this encounter.   Melynda Ripple, NP 08/21/22 [phone]

## 2022-08-21 NOTE — ED Triage Notes (Signed)
Pt is present today with c/o cough and congestion x1 month  Pt states that he also noticed penile bumps x2 days ago. Pt denies any pain

## 2022-09-18 ENCOUNTER — Ambulatory Visit: Payer: Commercial Managed Care - HMO | Admitting: Internal Medicine

## 2022-10-17 ENCOUNTER — Encounter: Payer: Self-pay | Admitting: Internal Medicine

## 2022-10-17 ENCOUNTER — Ambulatory Visit (INDEPENDENT_AMBULATORY_CARE_PROVIDER_SITE_OTHER): Payer: Medicaid Other | Admitting: Internal Medicine

## 2022-10-17 VITALS — BP 120/68 | HR 65 | Ht 66.0 in | Wt 143.0 lb

## 2022-10-17 DIAGNOSIS — R1010 Upper abdominal pain, unspecified: Secondary | ICD-10-CM

## 2022-10-17 DIAGNOSIS — K219 Gastro-esophageal reflux disease without esophagitis: Secondary | ICD-10-CM

## 2022-10-17 DIAGNOSIS — Z9889 Other specified postprocedural states: Secondary | ICD-10-CM

## 2022-10-17 MED ORDER — PANTOPRAZOLE SODIUM 40 MG PO TBEC: 40.0000 mg | DELAYED_RELEASE_TABLET | Freq: Every day | ORAL | 5 refills | Status: DC

## 2022-10-17 NOTE — Patient Instructions (Signed)
Your provider has requested that you have an abdominal x ray in 1 week. Please go to the basement floor to our Radiology department for the test.   We have sent the following medications to your pharmacy for you to pick up at your convenience: Pantoprazole 40 mg  Due to recent changes in healthcare laws, you may see the results of your imaging and laboratory studies on MyChart before your provider has had a chance to review them.  We understand that in some cases there may be results that are confusing or concerning to you. Not all laboratory results come back in the same time frame and the provider may be waiting for multiple results in order to interpret others.  Please give [place] 48 hours in order for your provider to thoroughly review all the results before contacting the office for clarification of your results.    It was a pleasure to see you today!  Thank you for trusting me with your gastrointestinal care!

## 2022-10-17 NOTE — Progress Notes (Signed)
Chief Complaint: Abdominal pain  HPI : 62 year old male with history of ampullary adenoma s/p resection, asthma and knee pain presents for follow up of abdominal pain and ampullary adenoma  Interval History: He still has some burning in his upper abdomen that is helped by Mylanta. He had his ERCP procedure last week to remove the ampullary adenoma. His abdominal pain did not worsen after the procedure. He last passed some blood in his stools this past Monday and has been doing well since then. He is eating and drinking well. Denies N&V  Wt Readings from Last 3 Encounters:  10/17/22 143 lb (64.9 kg)  07/11/22 144 lb 12.8 oz (65.7 kg)  06/27/22 146 lb (66.2 kg)   Current Outpatient Medications  Medication Sig Dispense Refill   albuterol (VENTOLIN HFA) 108 (90 Base) MCG/ACT inhaler Inhale 2 puffs into the lungs every 6 (six) hours as needed for wheezing or shortness of breath (cough). 18 g 0   alum & mag hydroxide-simeth (MAALOX/MYLANTA) 200-200-20 MG/5ML suspension Take 15 mLs by mouth every 6 (six) hours as needed (Stomach issure).     benzonatate (TESSALON) 200 MG capsule Take 1 capsule (200 mg total) by mouth 3 (three) times daily as needed for cough. 20 capsule 0   sucralfate (CARAFATE) 1 g tablet Take 1 tablet (1 g total) by mouth 4 (four) times daily -  with meals and at bedtime. (Patient taking differently: Take 1 g by mouth 4 (four) times daily as needed (stomach issues.).) 30 tablet 0   No current facility-administered medications for this visit.   Physical Exam: BP 120/68   Pulse 65   Ht '5\' 6"'$  (1.676 m)   Wt 143 lb (64.9 kg)   SpO2 97%   BMI 23.08 kg/m  Constitutional: Pleasant,well-developed, male in no acute distress. HEENT: Normocephalic and atraumatic. Conjunctivae are normal. No scleral icterus. Cardiovascular: Normal rate Pulmonary/chest: Effort normal Abdominal: Soft, nondistended, mildly tender over the upper abdomen Extremities: No edema Neurological: Alert and  oriented to person place and time. Skin: Skin is warm and dry. No rashes noted. Psychiatric: Normal mood and affect. Behavior is normal.  Labs 03/2020: FOBT negative.  Labs 11/2021: Lipase mildly elevated at 56.  Labs 12/2021: CMP unremarkable. CBC with mildly low WBC of 3.3. UDS negative. Troponin negative. HCV antibody and HIV negative.  Ab U/S 12/01/21: IMPRESSION: 1. No acute abnormality to account for the patient's symptoms. Normal abdominal ultrasound  CTA C/A/P w/contrast 12/24/21: IMPRESSION: Vascular: 1. No findings of aortic dissection or aneurysm. Nonvascular: 1. No acute findings in the chest. 2. No acute findings in the abdomen or pelvis  MRI/MRCP 03/28/22: IMPRESSION: No suspicious mass or lymphadenopathy identified  EGD 12/29/21: - Salmon-colored mucosa suspicious for Barrett's esophagus. Biopsied. - Gastritis. Biopsied. - A single duodenal polyp. Biopsied. - Biopsies were taken with a cold forceps for histology in the duodenal bulb and in the second portion of the duodenum. Path: 1. Surgical [P], duodenal - PEPTIC DUODENITIS - NO DYSPLASIA OR MALIGNANCY IDENTIFIED 2. Surgical [P], duodenal polyp - DUODENAL ADENOMA WITH LOW-GRADE DYSPLASIA - SEE COMMENT 3. Surgical [P], gastric - CHRONIC GASTRITIS WITH REACTIVE CHANGES AND FOCAL ACTIVITY - NO INTESTINAL METAPLASIA IDENTIFIED - SEE COMMENT 4. Surgical [P], GE injection - SQUAMOCOLUMNAR JUNCTION WITH ACUTE AND CHRONIC INFLAMMATION - NO INTESTINAL METAPLASIA, DYSPLASIA OR MALIGNANCY IDENTIFIED  Colonoscopy 02/23/22: - The examined portion of the ileum was normal. - Three 3 to 5 mm polyps in the sigmoid colon, in the descending colon  and in the transverse colon, removed with a cold snare. Resected and retrieved. - Diverticulosis in the sigmoid colon and in the descending colon. - Non-bleeding internal hemorrhoids. Path: Surgical [P], colon, descending and transverse, sigmoid, polyp (3) - TUBULAR ADENOMA. -  HYPERPLASTIC POLYPS.  EUS 06/07/22: EGD impression: - No gross lesions in esophagus. Z-line irregular, 40 cm from the incisors. - No gross lesions in the stomach. - Polypoid lesion with ulceration noted at the major papilla. Rule out malignancy, duodenal mass. Biopsied after completion of EUS. - No other gross lesions in the duodenal bulb, in the first portion of the duodenum and in the second portion of the duodenum. EUS impression - A lesion was found at the ampulla. A tissue diagnosis was obtained prior to this exam with just being an adenoma. However as noted above, there are some findings that are a bit more concerning as to whether more invasive and this could be present. This was not removed today. - The pancreatic duct had a very minimally prominent endosonographic appearance in the pancreatic head with a normal PD diameter throughout the rest of the pancreas. - Pancreatic parenchymal abnormalities consisting of lobularity and hyperechoic strands were noted in the entire pancreas. - There was no sign of significant pathology in the common bile duct and in the common hepatic duct. - No malignant-appearing lymph nodes were visualized in the celiac region (level 20), peripancreatic region and porta hepatis region. Path: A. AMPULLARY MASS, BIOPSY:  - Ampullary adenoma (low-grade dysplasia)  - Negative for high-grade dysplasia or carcinoma in the submitted  material  ERCP 06/27/22: - The major papilla (known adenoma) is present with slight ulceration (unchanged from previous exam). - As documented, unfortunately unable to access the biliary or pancreatic orifices for cannulation in an effort of trying to pursue sphincterotomies and subsequent ampullectomy. - At one point, I considered trying to just go ahead and resect the lesion without entering either orifice and placed a hexagonal 15 mm snare around the ampullary region. This looks like it may be accessible and possible, if repeat  attempt at access of the pancreatic/biliary orifice this is not able to be achieved. My typical resection technique allows me to at least enter 1 or both of these orifices and perform sphincterotomies before resection so that we can protect these orifices is much as possible. Thus a reason I did not pursue removal today.  ERCP 10/11/22: Impression: - The major papilla appeared to be prominent and  polypoid. - Normal limited pancreatogram. A pancreatic  sphincterotomy was performed. A prophylactic pancreas  stent was placed after the ampulla was removed. - Hot snare major ampullectomy was performed. The  margin of the polypectomy site was treated with snare  tip soft coagulation. Three hemostatic clips were  successfully placed (MR conditional) on the inferior  margin of the polypectomy site. - There was minor post procedure bleeding near the  superior aspect of the polypectomy site. Site was  injected with epinephrine and treated with coagulation  forceps. Residual oozing was present treated with the  application of purastat. There was no bleeding at the  end of the procedure.  Path: A.  Major ampulla, endoscopic mucosal resection: Ampullary adenoma. See comment. Negative for high-grade dysplasia and malignancy. Small bowel mucosa with underlying biliary-type mucosa consistent with major ampulla. Multiple levels are examined. Comment: The majority of the margin is free of adenomatous change; however, there is one focus of adenomatous epithelium that is cauterized and approaching the deep margin. Therefore,  a focal positive margin for adenomatous change cannot be entirely ruled out.   ASSESSMENT AND PLAN: Upper abdominal pain GERD Ampullary adenoma s/p resection Patient presents for follow up of upper abdominal pain. He had his ampullary adenoma resected last week at [facility] via ERCP. Because a pancreatic stent was placed, patient will need a KUB in the next week to see if the stent has  passed. Patient continues to have some mild ab pain in the upper abdomen. Will plan to start him on daily PPI to see if this helps with his symptoms - Previously received GERD friendly diet handout - Start pantoprazole 40 mg QD - KUB in 1 week - Plan for repeat EGD in 6 months - RTC 5 months  [name], MD  I spent 41 minutes of time, including in depth chart review, independent review of results as outlined above, communicating results with the patient directly, face-to-face time with the patient, coordinating care, and ordering studies and medications as appropriate, and documentation.

## 2022-11-01 ENCOUNTER — Ambulatory Visit (INDEPENDENT_AMBULATORY_CARE_PROVIDER_SITE_OTHER)
Admission: RE | Admit: 2022-11-01 | Discharge: 2022-11-01 | Disposition: A | Discharge status: Home / Self Care | Payer: Medicaid Other | Source: Ambulatory Visit | Attending: Internal Medicine | Admitting: Internal Medicine

## 2022-11-01 ENCOUNTER — Other Ambulatory Visit: Payer: Commercial Managed Care - HMO

## 2022-11-01 DIAGNOSIS — Z9889 Other specified postprocedural states: Secondary | ICD-10-CM

## 2022-11-01 DIAGNOSIS — K219 Gastro-esophageal reflux disease without esophagitis: Secondary | ICD-10-CM

## 2022-11-01 DIAGNOSIS — R1010 Upper abdominal pain, unspecified: Secondary | ICD-10-CM

## 2022-11-07 ENCOUNTER — Telehealth: Payer: Self-pay | Admitting: Internal Medicine

## 2022-11-07 NOTE — Telephone Encounter (Signed)
Patient is inquiring about his abd xray and the stent. He reports he is having good and emptying bowel movements. He denies any abdominal pain.

## 2022-11-07 NOTE — Telephone Encounter (Signed)
Patient is calling wishing for the nurse to call his and go over his results with him. Please advise

## 2022-11-08 NOTE — Telephone Encounter (Signed)
Spoke with patient to see if he had any questions. He does not have any questions at this time. He will contact us if he fails to continue to improve or if he acutely worsens.

## 2022-12-17 ENCOUNTER — Ambulatory Visit (HOSPITAL_COMMUNITY)
Admission: EM | Admit: 2022-12-17 | Discharge: 2022-12-17 | Disposition: A | Discharge status: Home / Self Care | Payer: Medicaid Other | Attending: Emergency Medicine | Admitting: Emergency Medicine

## 2022-12-17 ENCOUNTER — Encounter (HOSPITAL_COMMUNITY): Payer: Self-pay | Admitting: Emergency Medicine

## 2022-12-17 ENCOUNTER — Other Ambulatory Visit: Payer: Self-pay

## 2022-12-17 DIAGNOSIS — M549 Dorsalgia, unspecified: Secondary | ICD-10-CM

## 2022-12-17 DIAGNOSIS — K219 Gastro-esophageal reflux disease without esophagitis: Secondary | ICD-10-CM | POA: Diagnosis not present

## 2022-12-17 DIAGNOSIS — R1111 Vomiting without nausea: Secondary | ICD-10-CM

## 2022-12-17 LAB — POCT URINALYSIS DIPSTICK, ED / UC
Bilirubin Urine: NEGATIVE
Glucose, UA: NEGATIVE mg/dL
Hgb urine dipstick: NEGATIVE
Leukocytes,Ua: NEGATIVE
Nitrite: NEGATIVE
Protein, ur: NEGATIVE mg/dL
Specific Gravity, Urine: >=1.03 (ref 1.005–1.030)
Urobilinogen, UA: 0.2 mg/dL (ref 0.0–1.0)
pH: 5 (ref 5.0–8.0)

## 2022-12-17 MED ORDER — ONDANSETRON 4 MG PO TBDP
4.0000 mg | ORAL_TABLET | Freq: Once | ORAL | Status: AC
  Administered 2022-12-17: 4 mg via ORAL

## 2022-12-17 MED ORDER — ONDANSETRON 4 MG PO TBDP
ORAL_TABLET | ORAL | Status: AC
  Filled 2022-12-17: qty 1

## 2022-12-17 MED ORDER — FAMOTIDINE 20 MG PO TABS: 20.0000 mg | ORAL_TABLET | Freq: Every day | ORAL | 0 refills | Status: DC

## 2022-12-17 MED ORDER — ONDANSETRON 4 MG PO TBDP: 4.0000 mg | ORAL_TABLET | Freq: Four times a day (QID) | ORAL | 0 refills | Status: DC | PRN

## 2022-12-17 NOTE — Discharge Instructions (Addendum)
Your urine looks good apart from it being very concentrated.  Please increase your water intake to at least 64 ounces daily.   You can use the nausea medicine every 6 hours as needed to keep the stomach settled. If at any point you are unable to keep water down, please go directly to the emergency department Try a bland diet for the next few days  I recommend to start the once daily reflux medicine (pepcid). You can get this over the counter.   Continue ice/heat to the back. Use ibuprofen or tylenol for pain.  Please follow with your primary care provider at your visit next week.

## 2022-12-17 NOTE — ED Notes (Signed)
Patient has taken a call from someone to discussion work.

## 2022-12-17 NOTE — ED Triage Notes (Signed)
Right upper back pain, light headed vomiting x 3 today.  Denies diarrhea.  Patient has been having sinus headache.     Has taken sinus tablets

## 2022-12-17 NOTE — ED Provider Notes (Signed)
[facility]    CSN: [id number] Arrival date & time: 12/17/22  1725     History   Chief Complaint Chief Complaint  Patient presents with   Back Pain    HPI Zachary FavorsMichael E Sandstrom is a 62 y.o. male.  Here with right sided back pain for a while. Points to upper back. Does not radiate. Denies injury or trauma. He does heavy lifting and twisting at work and feels this is related. Occasionally uses ibuprofen, ice and heat. No urinary symptoms. Normal BM  Reports vomiting 3x today. NBNB No abdominal pain or diarrhea. No fevers. Has not had anything to drink besides coffee this morning  No known sick contacts   Hx reflux Reports symptoms every other day. 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   Abnormal endoscopy of upper gastrointestinal tract 04/07/2022   Ampullary adenoma 04/07/2022   Generalized abdominal pain 04/07/2022   Anxiety and depression 12/27/2021   Abdominal pain, acute, epigastric 12/06/2021   Elevated lipase 12/06/2021   Allergic rhinitis 11/15/2020   Inguinal hernia of right side without obstruction or gangrene 03/17/2020   Periodontal disease 03/17/2020   Dental caries 03/17/2020   Mild intermittent asthma without complication 03/17/2020   Gastroesophageal reflux disease without esophagitis 03/17/2020   Chronic pain of right knee 03/17/2020    Past Surgical History:  Procedure Laterality Date   BIOPSY  06/07/2022   Procedure: BIOPSY;  Surgeon: Lemar LoftyMansouraty, Gabriel Jr., MD;  [facility];  Service: Gastroenterology;;   ENDOSCOPIC RETROGRADE CHOLANGIOPANCREATOGRAPHY (ERCP) WITH PROPOFOL N/A 06/27/2022   Procedure: ENDOSCOPIC  RETROGRADE CHOLANGIOPANCREATOGRAPHY (ERCP) WITH PROPOFOL;  Surgeon: Lemar LoftyMansouraty, Gabriel Jr., MD;  [facility];  Service: Gastroenterology;  Laterality: N/A;   ESOPHAGOGASTRODUODENOSCOPY (EGD) WITH PROPOFOL N/A 06/07/2022   Procedure: ESOPHAGOGASTRODUODENOSCOPY (EGD) WITH PROPOFOL;  Surgeon: Meridee ScoreMansouraty, Netty StarringGabriel Jr., MD;  Location: [facility];  Service: Gastroenterology;  Laterality: N/A;   ESOPHAGOGASTRODUODENOSCOPY ENDOSCOPY  10/11/2021   EUS N/A 06/07/2022   Procedure: UPPER ENDOSCOPIC ULTRASOUND (EUS) RADIAL;  Surgeon: Lemar LoftyMansouraty, Gabriel Jr., MD;  Location: [facility];  Service: Gastroenterology;  Laterality: N/A;   KNEE ARTHROSCOPY     x 2   RADIOLOGY WITH ANESTHESIA N/A 03/28/2022   Procedure: MRI ABDOMEN MRCP WITH AND WITHOUT CONTRAST WITH ANESTHESIA;  Surgeon: Radiologist, Medication, MD;  Location: [facility];  Service: Radiology;  Laterality: N/A;   UPPER GASTROINTESTINAL ENDOSCOPY  12/29/2021   Dr.Ying Leonides Schanzorsey     Home Medications    Prior to Admission medications   Medication Sig Start Date End Date Taking? Authorizing Provider  famotidine (PEPCID) 20 MG tablet Take 1 tablet (20 mg total) by mouth daily. 12/17/22  Yes Mistie Adney, PA-C  ondansetron (ZOFRAN-ODT) 4 MG disintegrating tablet Take 1 tablet (4 mg total) by mouth every 6 (six) hours as needed for nausea or vomiting. 12/17/22  Yes Lazar Tierce, Lurena Joinerebecca, PA-C  albuterol (VENTOLIN HFA) 108 (90 Base) MCG/ACT inhaler Inhale 2 puffs into the lungs every 6 (six) hours as needed for wheezing or shortness of breath (cough). 09/15/20   Storm FriskWright, Patrick E, MD  alum & mag hydroxide-simeth (MAALOX/MYLANTA) 200-200-20 MG/5ML suspension Take 15 mLs by mouth  every 6 (six) hours as needed (Stomach issure).    [provider]  benzonatate (TESSALON) 200 MG capsule Take 1 capsule (200 mg total) by mouth 3 (three) times daily as needed for cough. Patient not taking: Reported on 12/17/2022 08/21/22   Radford Pax, NP  pantoprazole  (PROTONIX) 40 MG tablet Take 1 tablet (40 mg total) by mouth daily. 10/17/22   Imogene Burn, MD  sucralfate (CARAFATE) 1 g tablet Take 1 tablet (1 g total) by mouth 4 (four) times daily -  with meals and at bedtime. Patient taking differently: Take 1 g by mouth 4 (four) times daily as needed (stomach issues.). 12/24/21   Tegeler, Canary Brim, MD    Family History Family History  Problem Relation Age of Onset   Hypertension Mother    Cancer Father    Colon cancer Maternal Uncle    Colon cancer Cousin    Cancer Other    Esophageal cancer Neg Hx    Inflammatory bowel disease Neg Hx    Liver disease Neg Hx    Pancreatic cancer Neg Hx    Rectal cancer Neg Hx    Stomach cancer Neg Hx     Social History Social History   Tobacco Use   Smoking status: Former   Smokeless tobacco: Never  Building services engineer Use: Never used  Substance Use Topics   Alcohol use: No   Drug use: No     Allergies   Patient has no known allergies.   Review of Systems Review of Systems As per HPI  Physical Exam Triage Vital Signs ED Triage Vitals  Enc Vitals Group     BP 12/17/22 1825 121/76     Pulse Rate 12/17/22 1825 69     Resp 12/17/22 1825 20     Temp 12/17/22 1832 97.9 F (36.6 C)     Temp Source 12/17/22 1832 Oral     SpO2 12/17/22 1825 95 %     Weight --      Height --      Head Circumference --      Peak Flow --      Pain Score 12/17/22 1823 8     Pain Loc --      Pain Edu? --      Excl. in GC? --    No data found.  Updated Vital Signs BP 121/76 (BP Location: Right Arm)   Pulse 69   Temp 97.9 F (36.6 C) (Oral)   Resp 20   SpO2 95%    Physical Exam Vitals and nursing note reviewed.  Constitutional:      Appearance: Normal appearance.  HENT:     Mouth/Throat:     Mouth: Mucous membranes are moist.     Pharynx: Oropharynx is clear.  Eyes:     Conjunctiva/sclera: Conjunctivae normal.  Cardiovascular:     Rate and Rhythm: Normal rate and regular rhythm.      Heart sounds: Normal heart sounds.  Pulmonary:     Effort: Pulmonary effort is normal. No respiratory distress.     Breath sounds: Normal breath sounds.  Abdominal:     General: Bowel sounds are normal.     Palpations: Abdomen is soft.     Tenderness: There is abdominal tenderness in the epigastric area. There is no right CVA tenderness, left CVA tenderness, guarding or rebound.     Comments: Minorly tender epigastric without rebound, guarding, or mas  Musculoskeletal:  General: Normal range of motion.     Comments: Right upper back a little tender. No bony tenderness. Full ROM neck and back. No skin changes. Strength 5/5 throughout  Skin:    General: Skin is warm and dry.  Neurological:     General: No focal deficit present.     Mental Status: He is alert and oriented to person, place, and time. Mental status is at baseline.     Cranial Nerves: No cranial nerve deficit.     Motor: No weakness.     UC Treatments / Results  Labs (all labs ordered are listed, but only abnormal results are displayed) Labs Reviewed  POCT URINALYSIS DIPSTICK, ED / UC - Abnormal; Notable for the following components:      Result Value   Ketones, ur TRACE (*)    All other components within normal limits    EKG  Radiology No results found.  Procedures Procedures (including critical care time)  Medications Ordered in UC Medications  ondansetron (ZOFRAN-ODT) disintegrating tablet 4 mg (4 mg Oral Given 12/17/22 1835)    Initial Impression / Assessment and Plan / UC Course  I have reviewed the triage vital signs and the nursing notes.  Pertinent labs & imaging results that were available during my care of the patient were reviewed by me and considered in my medical decision making (see chart for details).  Afebrile, stable vitals. Well appearing.   Zofran ODT given. P.o. challenge successful Sent Zofran to use at home, recommend bland diet Unknown if gastroenteritis versus reflux  etiology Recommend starting pepcid daily   UA with elevated specific gravity.  No sign of infection. Advised to increase water intake   Upper back pain likely muscular. Reports been there for a while. Recommend ibu/tylenol, ice or heat, and avoiding heavy lifting. Neurologically intact.  He does have primary care visit in 3 days Return precautions discussed. Patient agrees to plan  Final Clinical Impressions(s) / UC Diagnoses   Final diagnoses:  Upper back pain on right side  Vomiting without nausea, unspecified vomiting type  Gastroesophageal reflux disease without esophagitis     Discharge Instructions      Your urine looks good apart from it being very concentrated.  Please increase your water intake to at least 64 ounces daily.   You can use the nausea medicine every 6 hours as needed to keep the stomach settled. If at any point you are unable to keep water down, please go directly to the emergency department Try a bland diet for the next few days  I recommend to start the once daily reflux medicine (pepcid). You can get this over the counter.   Continue ice/heat to the back. Use ibuprofen or tylenol for pain.  Please follow with your primary care provider at your visit next week.     ED Prescriptions     Medication Sig Dispense Auth. Provider   ondansetron (ZOFRAN-ODT) 4 MG disintegrating tablet Take 1 tablet (4 mg total) by mouth every 6 (six) hours as needed for nausea or vomiting. 20 tablet Luisana Lutzke, PA-C   famotidine (PEPCID) 20 MG tablet Take 1 tablet (20 mg total) by mouth daily. 30 tablet Britain Anagnos, Lurena Joiner, PA-C      PDMP not reviewed this encounter.   Kathrine Haddock 12/17/22 1905

## 2022-12-20 ENCOUNTER — Encounter: Payer: Self-pay | Admitting: Critical Care Medicine

## 2022-12-20 ENCOUNTER — Ambulatory Visit: Payer: Medicaid Other | Attending: Critical Care Medicine | Admitting: Critical Care Medicine

## 2022-12-20 VITALS — BP 139/76 | HR 56 | Temp 98.0°F | Ht 66.0 in | Wt 143.0 lb

## 2022-12-20 DIAGNOSIS — E782 Mixed hyperlipidemia: Secondary | ICD-10-CM | POA: Diagnosis not present

## 2022-12-20 DIAGNOSIS — D132 Benign neoplasm of duodenum: Secondary | ICD-10-CM | POA: Diagnosis not present

## 2022-12-20 DIAGNOSIS — J4520 Mild intermittent asthma, uncomplicated: Secondary | ICD-10-CM | POA: Diagnosis not present

## 2022-12-20 DIAGNOSIS — K4090 Unilateral inguinal hernia, without obstruction or gangrene, not specified as recurrent: Secondary | ICD-10-CM

## 2022-12-20 DIAGNOSIS — D135 Benign neoplasm of extrahepatic bile ducts: Secondary | ICD-10-CM

## 2022-12-20 NOTE — Progress Notes (Signed)
Established Patient Office Visit  Subjective:  Patient ID: Zachary [name], male    DOB: February 14, 1961  Age: 62 y.o. MRN: [id number]  CC:  Primary care follow-up HPI 10/11/21 Zachary [name] presents for follow up after ED visit for cough. This problem has persisted for a long time, but has been worse over the past two weeks. He last saw Dr. Delford Field in this office on 08/23/2021 and was diagnosed with acute recurrent maxillary sinusitis which was treated with a 5 day course of azithromycin. This helped with his symptoms for awhile but the problem returned.   He saw Bertram Denver FNP for this issue on 09/23/2021 via telemedicine visit. At that time, he was diagnosed with acute bronchitis and prescribed promethazine cough syrup. He then presented to the ED on 09/30/21 for productive cough, bloating, abdominal fullness, and reflux symptoms. ED workup revealed no additional findings. At that time, supportive care was recommended for the cough, and treatment of GERD symptoms with Protonix and Carafate.   He reports that today he is still experiencing productive cough, shortness of breath, chest tightness, and some wheezing. He still has congestion, sinus pain, rhinorrhea, and sinus pressure. His chest hurts from the amount of coughing he has been doing. He is unaware of fevers, but he has been waking up with sweating at night occasionally for the past few days. He also has headaches throughout the day. He has been feeling a lot of nausea that he thinks is related to the cough. He has used cough syrup as prescribed as well as Mucinex, Flonase, and cetirizine daily. He thinks that these help, but only for a short amount of time. This issue has left him feeling very fatigued, but otherwise his mood is fine.  12/06/21 Patient returns after having been in the emergency room recently for abdominal pain below is documentation from the ER visit In ED for Abdominal pain   I was informed by tech that patient  refused to go in scanner  [EH]  0421 Ativan had been ordered for anxiety and claustrophobia for CT scan, however patient refused this.  I spoke with patient, he refuses to try the Ativan saying that he knows it will not work and that he needs to be " knocked out" for a CAT scan on his abdomen. I explained to him that general anesthesia for a CT scan on his abdomen is not currently possible or appropriate.  He refuses to even try the Ativan to see how that makes him feel.  He will allow me to obtain a ultrasound on his abdomen. We had a extensive discussion that even with a ultrasound on his abdomen I am unable to rule out life threatening causes including those that may cause pain, worsening of condition, disability that may be severe and other complications and he states his understanding. [EH]  0531 Patient continues to refuse CT scan or to attempt to try the ativan.  I asked him if there was anything I could do to allow him to obtain the CT scan today after we discussed the purpose of the CT scan and he stated that unless I was able to fully knock him out that there was nothing I can do.  He insists that he will be back later today for the scan.  When I asked him what will change between now and then he tells me "I just need time to think about it." [EH]     Clinical Course User Index [EH] Jeraldine Loots,  [name], PA-C                            Medical Decision Making Patient is a 62 year old man who presents today for evaluation of abdominal pain.  On my exam his abdomen is generally tender.   Labs are obtained and reviewed, please see below. I recommended a CT abdomen pelvis with contrast.  Patient refused this. I gave him pain meds, nausea meds.  I recommended a dose of Ativan as he reports he is claustrophobic.  He did attempt to go to CT scan once however once the bed began moving he refused to continue. Patient refused to try Ativan.  He told me multiple times that it would not work despite  never having it before. Please see ED course for further details. Ultimately patient continued to refuse to try anxiolytics or CT scan. We discussed risks of this and that he is making this decision AGAINST MEDICAL ADVICE.  We discussed that I am unable to rule out life-threatening or other serious potential causes of his symptoms and abdominal pain.  He states his understanding of this.  There is no family at bedside for me to try to have convince patient to stay.   He states that there is nothing I can do to allow him to have the CT scan short of general anesthetic which I informed him is not something I am able to provide and do not think it is appropriate especially as he will not even try anxiolytics.   I have attempted to treat and evaluate patient with in the limits of what he will allow.    Patient will sign out AMA.    Amount and/or Complexity of Data Reviewed Labs: ordered.    Details: CBC and CMP are unremarkable.  Lipase is minimally elevated at 56.  Troponin x2 is not elevated. Radiology: ordered.    Details: I ordered a CT abdomen pelvis with contrast that patient refused, please see ED course.  Chest x-ray without consolidation pneumothorax or other abnormalities.  Abdominal ultrasound obtained without cause for his symptoms found. ECG/medicine tests: ordered.   Risk Prescription drug management. Parenteral controlled substances. Diagnosis or treatment significantly limited by social determinants of health. Risk Details: Patient refused imaging, left AMA.    Note the patient left AGAINST MEDICAL ADVICE his labs showed elevated lipase and ultrasound of the abdomen did not show specific cause of his symptoms.  He had significant epigastric abdominal pain with nausea and occasional emesis.  He would regurgitate some bile colored material.  He does not drink alcohol is not currently smoking cigarettes.  The abdominal pain has subsided somewhat with the use of pantoprazole.   Unfortunately his insurance would only pay for once daily pantoprazole.  He denies constipation or diarrhea.  There is no blood in the stool no blood in the emesis.  Patient comes to the office today after having been on the pantoprazole for further follow-up and evaluations.  Note his prior visit in January revolved around acute maxillary sinusitis recurrent which now has resolved.  12/27/2021 Mult ED visits since last OV This patient since last being seen end of March has been to the emergency room 5 times for variety of issues.  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  He then had visual blurring found  to have elevated blood pressure with this.  He had a negative cardiac work-up that he had abdominal pain and chest pain ER visits.  He ultimately had a complete CT scan of the chest and abdomen which was negative.  He had a extensive laboratory work-up that was unremarkable.  He has also seen gastroenterology below is documentation from that visit.  62 year old male with history of asthma and knee pain presents with abdominal pain   He started experiencing abdominal pain a couple of months ago. When he went to his doctor, he was told that he had ulcers. This abdominal pain has gotten worse over time.  He was trying to take pantoprazole 40 mg QD, but he could not tolerate the medication because it was causing him to feel overheated. The PPI did help a little with his pain, but he did not feel right. He has been prescribed Bentyl in the past which has also not helped. He has been using Pepto Bismol, which has been helping with the abdominal pain.  He has been experiencing burning in his chest. Feels really full. Used to be heavy drinker when he was younger, but he stopped drinking alcohol 30 years ago. Former smoker. Denies diarrhea or constipation. Saw a little bit of bleeding in the  stool recently. He has been doing stool tests for colon cancer screening, which have been negative. Denies dysphagia or N&V. Father and grandfather had colon cancer. Father had colon cancer in his 57s. He has never had an EGD or colonoscopy in the past.  Patient does have issues with headaches and has been taking ibuprofen to help with this.  He has been experiencing some vision changes recently as well as numbness and the left side of his face and left arm.      Wt Readings from Last 3 Encounters:  12/23/21 145 lb (65.8 kg)  12/12/21 150 lb (68 kg)  12/06/21 147 lb (66.7 kg)  ASSESSMENT AND PLAN: Diffuse abdominal pain GERD Loss of appetite Colon cancer screening Family history of colon cancer Patient presents with abdominal pain that started a few months ago.  Unclear etiology at this time.  Could potentially consider contribution from PUD since the patient endorses NSAID use.  I will plan to start the patient on omeprazole 20 mg daily.  I will also go ahead and obtain CT scan and EGD for further evaluation.  Patient has never had a colonoscopy for colon cancer screening in the past despite his family history of colon cancer.  Thus we will plan to proceed with colonoscopy for colon cancer screening.  I went over the risks and benefits of EGD and colonoscopy with the patient in detail, and he is agreeable to proceeding. - GERD friendly  - Start omeprazole 20 mg QD - Reduce NSAID use. Okay to use Tylenol PRN. - CT A/P w/contrast - EGD/colonoscopy [facility]   The endoscopy was to be coupled with a colonoscopy and is not scheduled for May 29.  Patient comes to the office today accompanied by his granddaughter [place].  Blood pressure on arrival 143/91.  Still having quite a bit of chest pain.  He has been quite anxious and nervous.  He tends to overthink his symptoms and overthink side effects.  He stopped some of the medications he should be taking for his acid reflux.  He is yet to pick up the Carafate  that was given to him from the emergency room on April 15.  6/28 Patient seen in  return follow-up Patient's abdominal pain markedly improved patient's been to gastroenterology was found to have gastritis Barrett's esophagitis duodenal polyps gastric polyps all of which were benign he had a colonoscopy which showed tubular adenomas which were removed.  Patient is on twice daily omeprazole 40 mg.  Patient states he is having less vomiting and nausea or vomiting.  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.  He needs lipids rechecked today and he currently is not smoking not drinking alcohol.  Largest issue is he has a persistent right inguinal hernia that needs to be assessed  Past Medical History:  Diagnosis Date   Ampullary adenoma    Asthma    COVID-19 virus infection 09/15/2020   Diverticulosis    GERD (gastroesophageal reflux disease)    Knee pain     Past Surgical History:  Procedure Laterality Date   BIOPSY  06/07/2022   Procedure: BIOPSY;  Surgeon: Lemar Lofty., MD;  Location: [facility];  Service: Gastroenterology;;   ENDOSCOPIC RETROGRADE CHOLANGIOPANCREATOGRAPHY (ERCP) WITH PROPOFOL N/A 06/27/2022   Procedure: ENDOSCOPIC RETROGRADE CHOLANGIOPANCREATOGRAPHY (ERCP) WITH PROPOFOL;  Surgeon: Lemar Lofty., MD;  Location: [facility];  Service: Gastroenterology;  Laterality: N/A;   ESOPHAGOGASTRODUODENOSCOPY (EGD) WITH PROPOFOL N/A 06/07/2022   Procedure: ESOPHAGOGASTRODUODENOSCOPY (EGD) WITH PROPOFOL;  Surgeon: Meridee Score Netty Starring., MD;  Location: [facility];  Service: Gastroenterology;  Laterality: N/A;   ESOPHAGOGASTRODUODENOSCOPY ENDOSCOPY  10/11/2021   EUS N/A 06/07/2022   Procedure: UPPER  ENDOSCOPIC ULTRASOUND (EUS) RADIAL;  Surgeon: Lemar Lofty., MD;  Location: [facility];  Service: Gastroenterology;  Laterality: N/A;   KNEE ARTHROSCOPY     x 2   RADIOLOGY WITH ANESTHESIA N/A 03/28/2022   Procedure: MRI ABDOMEN MRCP WITH AND WITHOUT CONTRAST WITH ANESTHESIA;  Surgeon: Radiologist, Medication, MD;  Location: [facility] OR;  Service: Radiology;  Laterality: N/A;   UPPER GASTROINTESTINAL ENDOSCOPY  12/29/2021   [name]    Family History  Problem Relation Age of Onset   Hypertension Mother    Cancer Father    Colon cancer Maternal Uncle    Colon cancer Cousin    Cancer Other    Esophageal cancer Neg Hx    Inflammatory bowel disease Neg Hx    Liver disease Neg Hx    Pancreatic cancer Neg Hx    Rectal cancer Neg Hx    Stomach cancer Neg Hx     Social History   Socioeconomic History   Marital status: Single    Spouse name: Not on file   Number of children: Not on file   Years of education: Not on file   Highest education level: Not on file  Occupational History   Not on file  Tobacco Use   Smoking status: Former   Smokeless tobacco: Never  Vaping Use   Vaping Use: Never used  Substance and Sexual Activity   Alcohol use: No   Drug use: No   Sexual activity: Yes  Other Topics Concern   Not on file  Social History Narrative   Not on file   Social Determinants of Health   Financial Resource Strain: Not on file  Food Insecurity: Not on file  Transportation Needs: Not on file  Physical Activity: Not on file  Stress: Not on file  Social Connections: Not on file  Intimate Partner Violence: Not on file    Outpatient Medications Prior to Visit  Medication Sig Dispense Refill   albuterol (VENTOLIN HFA) 108 (90 Base) MCG/ACT inhaler Inhale 2 puffs into the lungs every 6 (six) hours as needed for wheezing or shortness of breath (cough). 18 g 0   alum & mag hydroxide-simeth (MAALOX/MYLANTA) 200-200-20 MG/5ML suspension Take 15 mLs by mouth every 6  (six) hours as needed (Stomach issure).     famotidine (PEPCID) 20 MG tablet Take 1 tablet (20 mg total) by mouth daily. 30 tablet 0   ondansetron (ZOFRAN-ODT) 4 MG disintegrating tablet Take 1 tablet (4 mg total) by mouth every 6 (six) hours as needed for nausea or vomiting. 20 tablet 0   pantoprazole (PROTONIX) 40 MG tablet Take 1 tablet (40 mg total) by mouth daily. 30 tablet 5   sucralfate (CARAFATE) 1 g tablet Take 1 tablet (1 g total) by mouth 4 (four) times daily -  with meals and at bedtime. (Patient taking differently: Take 1 g by mouth 4 (four) times daily as needed (stomach issues.).) 30 tablet 0   benzonatate (TESSALON) 200 MG capsule Take 1 capsule (200 mg total) by mouth 3 (three) times daily as needed for cough. (Patient not taking: Reported on 12/17/2022) 20 capsule 0   No facility-administered medications prior to visit.    No Known Allergies  ROS Review of Systems  Constitutional: Negative.  Negative for diaphoresis and fever.  HENT: Negative.  Negative for congestion, ear pain, postnasal drip, rhinorrhea, sinus pressure, sinus pain, sore throat, trouble swallowing and voice change.   Eyes: Negative.  Negative for visual disturbance.  Respiratory: Negative.  Negative for apnea, cough, choking, chest tightness, shortness of breath, wheezing and stridor.   Cardiovascular: Negative.  Negative for chest pain, palpitations and leg swelling.  Gastrointestinal: Negative.  Negative for abdominal distention, abdominal pain, anal bleeding, blood in stool, constipation, diarrhea, nausea, rectal pain and vomiting.       Right inguinal hernia  Endocrine: Negative.   Genitourinary: Negative.   Musculoskeletal: Negative.  Negative for arthralgias and myalgias.  Skin: Negative.  Negative for rash.  Allergic/Immunologic: Negative.  Negative for environmental allergies and food allergies.  Neurological: Negative.  Negative for dizziness, syncope, weakness and headaches.  Hematological:  Negative.  Negative for adenopathy. Does not bruise/bleed easily.  Psychiatric/Behavioral: Negative.  Negative for agitation, dysphoric mood and sleep disturbance. The patient is not nervous/anxious.       Objective:    Physical Exam Vitals reviewed.  Constitutional:      General: He is not in acute distress.    Appearance: Normal appearance. He is well-developed and normal weight. He is not diaphoretic.  HENT:     Head: Normocephalic and atraumatic.     Right Ear: Tympanic membrane, ear canal and external ear normal. Impacted cerumen: non-impacted cerumen present.     Left Ear: Tympanic membrane, ear canal and external ear normal. Impacted cerumen: non-impacted cerumen present.     Nose: No nasal deformity, septal deviation, mucosal edema or rhinorrhea.     Right Turbinates: Swollen (pus visualized).     Left Turbinates: Swollen.     Right Sinus: Frontal sinus tenderness present. No maxillary sinus tenderness.     Left Sinus: Frontal sinus tenderness present. No maxillary sinus tenderness.     Mouth/Throat:     Mouth: Mucous membranes are moist.     Dentition: Abnormal dentition.  Pharynx: No oropharyngeal exudate (significant postnasal drip visualized) or posterior oropharyngeal erythema.     Tonsils: No tonsillar exudate.     Comments: Poor dentition Eyes:     General: No scleral icterus.    Conjunctiva/sclera: Conjunctivae normal.     Pupils: Pupils are equal, round, and reactive to light.  Neck:     Thyroid: No thyromegaly.     Vascular: No carotid bruit or JVD.     Trachea: Trachea normal. No tracheal tenderness or tracheal deviation.  Cardiovascular:     Rate and Rhythm: Normal rate and regular rhythm.     Chest Wall: PMI is not displaced.     Pulses: Normal pulses. No decreased pulses.     Heart sounds: Normal heart sounds, S1 normal and S2 normal. Heart sounds not distant. No murmur heard.    No systolic murmur is present.     No diastolic murmur is present.      No friction rub. No gallop. No S3 or S4 sounds.  Pulmonary:     Effort: Pulmonary effort is normal. No tachypnea, accessory muscle usage or respiratory distress.     Breath sounds: Normal breath sounds. No stridor. No decreased breath sounds, wheezing, rhonchi or rales.  Chest:     Chest wall: No tenderness.  Abdominal:     General: Bowel sounds are normal. There is no distension.     Palpations: Abdomen is soft. Abdomen is not rigid. There is no mass.     Tenderness: There is no abdominal tenderness. There is no right CVA tenderness, left CVA tenderness, guarding or rebound.     Hernia: A hernia is present.     Comments: Right inguinal hernia  Musculoskeletal:        General: Normal range of motion.     Cervical back: Normal range of motion and neck supple. No edema, erythema, rigidity or tenderness. No muscular tenderness. Normal range of motion.  Lymphadenopathy:     Head:     Right side of head: No submental or submandibular adenopathy.     Left side of head: No submental or submandibular adenopathy.     Cervical: No cervical adenopathy.  Skin:    General: Skin is warm and dry.     Coloration: Skin is not pale.     Findings: No rash.     Nails: There is no clubbing.  Neurological:     General: No focal deficit present.     Mental Status: He is alert and oriented to person, place, and time. Mental status is at baseline.     Sensory: No sensory deficit.  Psychiatric:        Mood and Affect: Mood normal.        Speech: Speech normal.        Behavior: Behavior normal.        Thought Content: Thought content normal.        Judgment: Judgment normal.     BP 139/76 (BP Location: Left Arm, Patient Position: Sitting, Cuff Size: Normal)   Pulse (!) 56   Temp 98 F (36.7 C) (Oral)   Ht 5\' 6"  (1.676 m)   Wt 143 lb (64.9 kg)   SpO2 99%   BMI 23.08 kg/m  Wt Readings from Last 3 Encounters:  12/20/22 143 lb (64.9 kg)  10/17/22 143 lb (64.9 kg)  07/11/22 144 lb 12.8 oz (65.7 kg)   DG Chest 2 View   Result Date: 12/01/2021 CLINICAL DATA:  Chest pain  EXAM: CHEST - 2 VIEW COMPARISON:  09/30/2021 FINDINGS: The heart size and mediastinal contours are within normal limits. Both lungs are clear. The visualized skeletal structures are unremarkable. IMPRESSION: No active cardiopulmonary disease. Electronically Signed   By: Deatra Dumas M.D.   On: 12/01/2021 02:22    US Abdomen Complete   Result Date: 12/01/2021 CLINICAL DATA:  62 year old male with history of abdominal pain. EXAM: ABDOMEN ULTRASOUND COMPLETE COMPARISON:  No priors. FINDINGS: Gallbladder: No gallstones or wall thickening visualized. No sonographic Murphy sign noted by sonographer. Common bile duct: Diameter: 3.3 mm Liver: No focal lesion identified. Within normal limits in parenchymal echogenicity. Portal vein is patent on color Doppler imaging with normal direction of blood flow towards the liver. IVC: No abnormality visualized. Pancreas: Visualized portion unremarkable. Spleen: Size and appearance within normal limits. Right Kidney: Length: 10.5 cm. Echogenicity within normal limits. No mass or hydronephrosis visualized. Left Kidney: Length: 9.8 cm. Echogenicity within normal limits. No mass or hydronephrosis visualized. Abdominal aorta: No aneurysm visualized. Other findings: None. IMPRESSION: 1. No acute abnormality to account for the patient's symptoms. Normal abdominal ultrasound. Electronically Signed   By: Trudie Reed M.D.   On: 12/01/2021 05:17    CT angio chest abd 4/15: IMPRESSION: Vascular:   1. No findings of aortic dissection or aneurysm.   Nonvascular:   1. No acute findings in the chest. 2. No acute findings in the abdomen or pelvis.   ED 12/24/21 BRAYLEN STALLER is a 62 y.o. male with a past medical history significant for GERD, asthma, previous inguinal hernia, and periodontal disease who presents for worsening pain in his chest and back.  According to patient, over the last few weeks he  has had many symptoms including transient waxing waning blurry vision for which she has not gotten glasses, abdominal pain for which she has seen GI who recommended CT abdomen pelvis as an outpatient and new reflux medication, and then since  last night developed pain across his chest going through to his back and down his left arm.  He reports the pain is a pressure and burning pain.  His blood pressure was elevated on arrival in the 150s and he reports his blood pressure has been up and down at times.  He reports no current nausea and vomiting but does history of gastric ulcers.  He reports no constipation or diarrhea but reports he is having some dysuria.  He denies fevers, chills, congestion, or cough.  Denies any shortness of breath currently but told triage that he was having short of breath with exertion.  Denies new leg pain or leg swelling.  Denies any injuries.  Reports his discomfort in his chest is a 7 out of 10 in severity.  He told triage that has been up to a 9 out of 10 in severity.  On exam, lungs are clear and chest is nontender.  I cannot reproduce his discomfort.  Abdomen was tender in the epigastric area.  Bowel sounds were appreciated and I did have symmetric pulses in upper and lower extremities.  Intact sensation and strength throughout.  Back was nontender.  Lungs were clear.  I did not appreciate a murmur.  Pupils are symmetric and reactive with normal extraocular movements.  Patient reports she has not yet used to his glasses.  Clinically I suspect patient has a esophageal reflux cause of his symptoms given the known history and the description of burning symptoms however as his pain has evolved to now the chest  and the back and his blood pressure is [name] elevated than prior we had a discussion and agreed to get a dissection study to rule out aortic cause as well as get better look at his abdomen and chest.  We will however get troponins to make sure he does not have a cardiac cause of  symptoms and will give a GI cocktail.  Patient is in agreement with this plan.  We will also do urinalysis given his recent dysuria.  Anticipate reassessment after work-up to determine disposition.   Work-up returned overall reassuring.  Troponin negative x2.  CTA showed no evidence of aneurysm or dissection in the aorta and did not comment on any large pulmonary embolism.  No other concerning findings acutely in the abdomen, pelvis, or thorax.  Given his history and the GI team suspicion for possible ulcers, with his burning symptoms in his chest, abdomen, and back, I am suspicious he could indeed have ulceration.  Patient will thus continue his home medications but we will add Carafate and he will call his GI team to get seen sooner if possible.  Patient agrees with plan of care and had no other questions or concerns.  Patient will be discharged with understanding return precautions and follow-up instructions         There are no preventive care reminders to display for this patient.   There are no preventive care reminders to display for this patient.  No results found for: "TSH" Lab Results  Component Value Date   WBC 4.1 04/04/2022   HGB 14.1 04/04/2022   HCT 41.4 04/04/2022   MCV 90.8 04/04/2022   PLT 247.0 04/04/2022   Lab Results  Component Value Date   NA 137 04/04/2022   K 3.8 04/04/2022   CO2 32 04/04/2022   GLUCOSE 91 04/04/2022   BUN 7 04/04/2022   CREATININE 0.86 04/04/2022   BILITOT 0.8 04/04/2022   ALKPHOS 66 04/04/2022   AST 15 04/04/2022   ALT 22 04/04/2022   PROT 8.1 04/04/2022   ALBUMIN 4.4 04/04/2022   CALCIUM 9.8 04/04/2022   ANIONGAP 5 12/24/2021   GFR 94.59 04/04/2022   Lab Results  Component Value Date   CHOL 201 (H) 07/15/2007   Lab Results  Component Value Date   HDL 40 07/15/2007   Lab Results  Component Value Date   LDLCALC 119 (H) 07/15/2007   Lab Results  Component Value Date   TRIG 209 (H) 07/15/2007   Lab Results  Component  Value Date   CHOLHDL 5.0 Ratio 07/15/2007   No results found for: "HGBA1C"    Assessment & Plan:   Problem List Items Addressed This Visit       Respiratory   Mild intermittent asthma without complication    Stable at this time        Digestive   Duodenal adenoma    Status post resection      Ampullary adenoma    Status post removal        Other   Inguinal hernia of right side without obstruction or gangrene - Primary    Needs assessment and refer to surgery      Relevant Orders   Ambulatory referral to General Surgery   Other Visit Diagnoses     Mixed hyperlipidemia       Relevant Orders   Lipid panel     No orders of the defined types were placed in this encounter. 38 minutes spent reviewing the records of the  multiple emergency room visits over the past 2 weeks complex decision making high collaboration of care with gastroenterology with a personal phone call to the Dr. Leonides Schanz patient education and multiple prescriptions managements  Follow-up: Return in about 5 months (around 05/22/2023) for chronic conditions.    Shan Levans, MD

## 2022-12-20 NOTE — Assessment & Plan Note (Signed)
Stable at this time 

## 2022-12-20 NOTE — Assessment & Plan Note (Signed)
Status post removal 

## 2022-12-20 NOTE — Assessment & Plan Note (Signed)
Status post resection

## 2022-12-20 NOTE — Assessment & Plan Note (Signed)
Needs assessment and refer to surgery

## 2022-12-20 NOTE — Patient Instructions (Signed)
Referral to general surgery made No medication changes Cholesterol panel will be obtained  Return Dr Delford Field 5 months

## 2022-12-21 ENCOUNTER — Other Ambulatory Visit: Payer: Self-pay | Admitting: Critical Care Medicine

## 2022-12-21 ENCOUNTER — Emergency Department (HOSPITAL_COMMUNITY)
Admission: EM | Admit: 2022-12-21 | Discharge: 2022-12-21 | Disposition: A | Discharge status: Home / Self Care | Payer: Medicaid Other | Attending: Emergency Medicine | Admitting: Emergency Medicine

## 2022-12-21 ENCOUNTER — Emergency Department (HOSPITAL_COMMUNITY): Payer: Medicaid Other

## 2022-12-21 ENCOUNTER — Encounter (HOSPITAL_COMMUNITY): Payer: Self-pay

## 2022-12-21 DIAGNOSIS — S61431A Puncture wound without foreign body of right hand, initial encounter: Secondary | ICD-10-CM | POA: Insufficient documentation

## 2022-12-21 DIAGNOSIS — E782 Mixed hyperlipidemia: Secondary | ICD-10-CM

## 2022-12-21 DIAGNOSIS — W310XXA Contact with mining and earth-drilling machinery, initial encounter: Secondary | ICD-10-CM | POA: Diagnosis not present

## 2022-12-21 DIAGNOSIS — S6991XA Unspecified injury of right wrist, hand and finger(s), initial encounter: Secondary | ICD-10-CM | POA: Diagnosis present

## 2022-12-21 LAB — LIPID PANEL
Chol/HDL Ratio: 4.5 ratio (ref 0.0–5.0)
Cholesterol, Total: 198 mg/dL (ref 100–199)
HDL: 44 mg/dL
LDL Chol Calc (NIH): 139 mg/dL — ABNORMAL HIGH (ref 0–99)
Triglycerides: 82 mg/dL (ref 0–149)
VLDL Cholesterol Cal: 15 mg/dL (ref 5–40)

## 2022-12-21 MED ORDER — ATORVASTATIN CALCIUM 10 MG PO TABS: 10.0000 mg | ORAL_TABLET | Freq: Every day | ORAL | 3 refills | Status: DC

## 2022-12-21 MED ORDER — OXYCODONE-ACETAMINOPHEN 5-325 MG PO TABS
2.0000 | ORAL_TABLET | Freq: Once | ORAL | Status: AC
  Administered 2022-12-21: 2 via ORAL
  Filled 2022-12-21: qty 2

## 2022-12-21 NOTE — ED Triage Notes (Signed)
Pt was using a drill today, slipped, injuring right hand below index finger, above webbing on hand. Small, drill bit size incision noted. Tetanus within last year or 2 per pt.

## 2022-12-21 NOTE — Discharge Instructions (Addendum)
Thank you for letting us take care of you today.  We did not see any injury on your x-ray today.  We gave you a dose of pain medication to help.  Please continue to take Tylenol and ibuprofen at home as needed for pain.  Please be sure to keep the wound clean and dry.  I attached information for you to read that discusses wound care.  You may use antibiotic ointment on the wound.  Follow-up with your doctor if you have any concerns regarding the wound.  If you notice worsening symptoms such as significant redness around the wound, puslike drainage, fever, difficulty moving the fingers or the hand, or other new concerns, please return to the nearest emergency department for reevaluation.

## 2022-12-21 NOTE — ED Provider Notes (Signed)
[place] EMERGENCY DEPARTMENT AT [facility] Provider Note   CSN: [id number] Arrival date & time: 12/21/22  1118     History  Chief Complaint  Patient presents with   Hand Injury    Zachary [name] is a 62 y.o. male who presents to the ED complaining of accidentally injuring his right hand.  He states that he just put a new drill bit on when it slipped and went into the first webspace of his right hand.  He is not on anticoagulants.  Minimal bleeding.  He states there should be no retained foreign body as he was able to remove all parts of the pill from wounds.  Last tetanus within 5 years.  Minimal associated pain.  No other injury.  He denies paresthesias or other symptoms at this time.      Home Medications Prior to Admission medications   Medication Sig Start Date End Date Taking? Authorizing Provider  albuterol (VENTOLIN HFA) 108 (90 Base) MCG/ACT inhaler Inhale 2 puffs into the lungs every 6 (six) hours as needed for wheezing or shortness of breath (cough). 09/15/20   Storm Frisk, MD  alum & mag hydroxide-simeth (MAALOX/MYLANTA) 200-200-20 MG/5ML suspension Take 15 mLs by mouth every 6 (six) hours as needed (Stomach issure).    [provider]  famotidine (PEPCID) 20 MG tablet Take 1 tablet (20 mg total) by mouth daily. 12/17/22   Rising, Rebecca, PA-C  ondansetron (ZOFRAN-ODT) 4 MG disintegrating tablet Take 1 tablet (4 mg total) by mouth every 6 (six) hours as needed for nausea or vomiting. 12/17/22   Rising, Lurena Joiner, PA-C  pantoprazole (PROTONIX) 40 MG tablet Take 1 tablet (40 mg total) by mouth daily. 10/17/22   Imogene Burn, MD  sucralfate (CARAFATE) 1 g tablet Take 1 tablet (1 g total) by mouth 4 (four) times daily -  with meals and at bedtime. Patient taking differently: Take 1 g by mouth 4 (four) times daily as needed (stomach issues.). 12/24/21   Tegeler, Canary Brim, MD      Allergies    Patient has no known allergies.    Review of Systems    Review of Systems  All other systems reviewed and are negative.   Physical Exam Updated Vital Signs BP (!) 138/107 (BP Location: Left Arm)   Pulse 74   Temp 98.5 F (36.9 C) (Oral)   Resp 16   Ht 5\' 6"  (1.676 m)   Wt 64.9 kg   SpO2 97%   BMI 23.08 kg/m  Physical Exam Vitals and nursing note reviewed.  Constitutional:      General: He is not in acute distress.    Appearance: Normal appearance.  HENT:     Head: Normocephalic and atraumatic.     Mouth/Throat:     Mouth: Mucous membranes are moist.  Eyes:     Conjunctiva/sclera: Conjunctivae normal.  Cardiovascular:     Rate and Rhythm: Normal rate and regular rhythm.     Heart sounds: No murmur heard. Pulmonary:     Effort: Pulmonary effort is normal.     Breath sounds: Normal breath sounds.  Abdominal:     General: Abdomen is flat.     Palpations: Abdomen is soft.     Tenderness: There is no abdominal tenderness.  Musculoskeletal:        General: Normal range of motion.     Cervical back: Normal range of motion and neck supple.     Right lower leg: No edema.  Left lower leg: No edema.     Comments: Small 0.5 cm circular wound to the first webspace of the right hand, minimal oozing of blood, 2+ radial pulse, soft compartments, sensation intact full range of motion of all digits, hand, and wrist intact, no signs of retained foreign body  Skin:    General: Skin is warm and dry.     Capillary Refill: Capillary refill takes less than 2 seconds.  Neurological:     Mental Status: He is alert. Mental status is at baseline.  Psychiatric:        Behavior: Behavior normal.     ED Results / Procedures / Treatments   Labs (all labs ordered are listed, but only abnormal results are displayed) Labs Reviewed - No data to display  EKG None  Radiology DG Hand 2 View Right  Result Date: 12/21/2022 CLINICAL DATA:  Right hand penetrating trauma. EXAM: RIGHT HAND - 2 VIEW COMPARISON:  None Available. FINDINGS: There is no  evidence of fracture or dislocation. Severe degenerative changes seen involving the first interphalangeal joint as well as the third metacarpal phalangeal joint. Moderate narrowing of radiocarpal joint is noted as well. No radiopaque foreign body. Soft tissues are unremarkable. IMPRESSION: Osteoarthritis as described above.  No acute abnormality seen. Electronically Signed   By: Lupita Raider M.D.   On: 12/21/2022 12:41    Procedures Procedures    Medications Ordered in ED Medications  oxyCODONE-acetaminophen (PERCOCET/ROXICET) 5-325 MG per tablet 2 tablet (2 tablets Oral Given 12/21/22 1225)    ED Course/ Medical Decision Making/ A&P                             Medical Decision Making Amount and/or Complexity of Data Reviewed Radiology: ordered. Decision-making details documented in ED Course.   Medical Decision Making:   Zachary [name] is a 62 y.o. male who presented to the ED today with hand wound detailed above.     Complete initial physical exam performed, notably the patient had small circular wound with bleeding controlled to the first webspace of the right hand.  No signs of foreign body.  Neurovascularly intact.  Soft compartments.    Reviewed and confirmed nursing documentation for past medical history, family history, social history.    Initial Assessment:   With the patient's presentation, differential diagnosis includes but is not limited to puncture wound, laceration, compartment syndrome, fracture, dislocation, nerve injury, tendon injury, retained foreign body.  This is most consistent with an acute complicated illness  Initial Plan:  X-ray to assess for bony pathology and/or retained foreign body Pain management Patient declines tetanus vaccination stating he believes his was within the last 5 years Objective evaluation as below reviewed   Initial Study Results:   Radiology:  All images reviewed independently. Agree with radiology report at this time.   DG  Hand 2 View Right  Result Date: 12/21/2022 CLINICAL DATA:  Right hand penetrating trauma. EXAM: RIGHT HAND - 2 VIEW COMPARISON:  None Available. FINDINGS: There is no evidence of fracture or dislocation. Severe degenerative changes seen involving the first interphalangeal joint as well as the third metacarpal phalangeal joint. Moderate narrowing of radiocarpal joint is noted as well. No radiopaque foreign body. Soft tissues are unremarkable. IMPRESSION: Osteoarthritis as described above.  No acute abnormality seen. Electronically Signed   By: Lupita Raider M.D.   On: 12/21/2022 12:41      Final Assessment and  Plan:   This is a 62 year old male who presents to ED complaining of puncture wounds to the first webspace of the right hand after hand accidentally slipped when using a drill bit.  Range of motion and neurovascular exam intact.  No signs of retained foreign body.  No significant bleeding.  No repairable wound.  Patient states that tetanus vaccination is up-to-date.  X-ray negative for acute findings. Wound irrigated and bandaged by nursing staff. Pt given strict ED return precautions, indication for follow up.  All questions answered and stable for discharge.   Clinical Impression:  1. Soft tissue injury of right hand, initial encounter   2. Puncture wound of right hand without foreign body, initial encounter      Discharge           Final Clinical Impression(s) / ED Diagnoses Final diagnoses:  Soft tissue injury of right hand, initial encounter  Puncture wound of right hand without foreign body, initial encounter    Rx / DC Orders ED Discharge Orders     None         Tonette LedererGowens, Dejon Lukas L, PA-C 12/21/22 1309    Jacalyn LefevreHaviland, Julie, MD 12/21/22 1449

## 2022-12-21 NOTE — Progress Notes (Signed)
Let patient know cholesterol is very high we will start a cholesterol pill to take daily this was sent to his [facility]

## 2022-12-22 ENCOUNTER — Encounter (HOSPITAL_COMMUNITY): Payer: Self-pay

## 2022-12-22 ENCOUNTER — Telehealth (INDEPENDENT_AMBULATORY_CARE_PROVIDER_SITE_OTHER): Payer: Self-pay

## 2022-12-22 ENCOUNTER — Other Ambulatory Visit: Payer: Self-pay

## 2022-12-22 ENCOUNTER — Emergency Department (HOSPITAL_COMMUNITY)
Admission: EM | Admit: 2022-12-22 | Discharge: 2022-12-22 | Disposition: A | Discharge status: Home / Self Care | Payer: Medicaid Other | Attending: Emergency Medicine | Admitting: Emergency Medicine

## 2022-12-22 DIAGNOSIS — S6991XA Unspecified injury of right wrist, hand and finger(s), initial encounter: Secondary | ICD-10-CM | POA: Diagnosis present

## 2022-12-22 DIAGNOSIS — W310XXA Contact with mining and earth-drilling machinery, initial encounter: Secondary | ICD-10-CM | POA: Insufficient documentation

## 2022-12-22 MED ORDER — CEPHALEXIN 500 MG PO CAPS: 500.0000 mg | ORAL_CAPSULE | Freq: Two times a day (BID) | ORAL | 0 refills | Status: DC

## 2022-12-22 NOTE — Discharge Instructions (Addendum)
Follow up with PCP for wound check Monday/ Tuesday next week  Start the antibiotics

## 2022-12-22 NOTE — ED Triage Notes (Signed)
Patient said a drill went into his right hand yesterday. Today it feels more swollen. Able to wiggle fingers.

## 2022-12-22 NOTE — ED Provider Triage Note (Signed)
Emergency Medicine Provider Triage Evaluation Note  Zachary [name] , [name] 62 y.o. male  was evaluated in triage.  Pt complains of right hand swelling. Seen yesterday for injury, drill bit to hand, xray neg. Today with swelling, not sent home on abx. No Numbness, weakness  Review of Systems  Positive: Right hand pain Negative: fever  Physical Exam  BP 136/84 (BP Location: Left Arm)   Pulse 85   Temp 98.7 F (37.1 C) (Oral)   Resp 16   Ht 5\' 6"  (1.676 m)   Wt 65 kg   SpO2 96%   BMI 23.13 kg/m  Gen:   Awake, no distress   Resp:  Normal effort  MSK:   Moves extremities without difficulty, mild soft tissue swelling to right hand, mild erythema, no fluctuance, induration Other:    Medical Decision Making  Medically screening exam initiated at 7:05 PM.  Appropriate orders placed.  Zachary [name] was informed that the remainder of the evaluation will be completed by another provider, this initial triage assessment does not replace that evaluation, and the importance of remaining in the ED until their evaluation is complete.  Hand swelling   Zachary Sereno A, PA-C 12/22/22 1906

## 2022-12-22 NOTE — ED Provider Notes (Signed)
[place] EMERGENCY DEPARTMENT AT [facility] Provider Note   CSN: [id number] Arrival date & time: 12/22/22  1821    History  Chief Complaint  Patient presents with   Hand Injury    Zachary [name] is a 62 y.o. male. Here for evaluation of right hand pain. Seen yesterday after drill bite slipped and hit first digit webspace. Xray was neg yesterday. No foreign body. Tetanus up to date. No numbness or weakness. Feels hand today is slightly swollen. Mild erythema. Can move hand without difficulty. No bleeding or drainage.    HPI     Home Medications Prior to Admission medications   Medication Sig Start Date End Date Taking? Authorizing Provider  cephALEXin (KEFLEX) 500 MG capsule Take 1 capsule (500 mg total) by mouth 2 (two) times daily. 12/22/22  Yes Loralai Eisman A, PA-C  albuterol (VENTOLIN HFA) 108 (90 Base) MCG/ACT inhaler Inhale 2 puffs into the lungs every 6 (six) hours as needed for wheezing or shortness of breath (cough). 09/15/20   Storm Frisk, MD  alum & mag hydroxide-simeth (MAALOX/MYLANTA) 200-200-20 MG/5ML suspension Take 15 mLs by mouth every 6 (six) hours as needed (Stomach issure).    [provider]  atorvastatin (LIPITOR) 10 MG tablet Take 1 tablet (10 mg total) by mouth daily. 12/21/22   Storm Frisk, MD  famotidine (PEPCID) 20 MG tablet Take 1 tablet (20 mg total) by mouth daily. 12/17/22   Rising, Rebecca, PA-C  ondansetron (ZOFRAN-ODT) 4 MG disintegrating tablet Take 1 tablet (4 mg total) by mouth every 6 (six) hours as needed for nausea or vomiting. 12/17/22   Rising, Lurena Joiner, PA-C  pantoprazole (PROTONIX) 40 MG tablet Take 1 tablet (40 mg total) by mouth daily. 10/17/22   Imogene Burn, MD  sucralfate (CARAFATE) 1 g tablet Take 1 tablet (1 g total) by mouth 4 (four) times daily -  with meals and at bedtime. Patient taking differently: Take 1 g by mouth 4 (four) times daily as needed (stomach issues.). 12/24/21   Tegeler, Canary Brim, MD     Allergies    Patient has no known allergies.    Review of Systems   Review of Systems  Constitutional: Negative.   HENT: Negative.    Respiratory: Negative.    Cardiovascular: Negative.   Gastrointestinal: Negative.   Genitourinary: Negative.   Musculoskeletal:        Hand swelling, redness  Neurological: Negative.   All other systems reviewed and are negative.  Physical Exam Updated Vital Signs BP 136/84 (BP Location: Left Arm)   Pulse 85   Temp 98.7 F (37.1 C) (Oral)   Resp 16   Ht 5\' 6"  (1.676 m)   Wt 65 kg   SpO2 96%   BMI 23.13 kg/m  Physical Exam Vitals and nursing note reviewed.  Constitutional:      General: He is not in acute distress.    Appearance: He is well-developed. He is not ill-appearing, toxic-appearing or diaphoretic.  HENT:     Head: Normocephalic and atraumatic.  Eyes:     Pupils: Pupils are equal, round, and reactive to light.  Cardiovascular:     Rate and Rhythm: Normal rate and regular rhythm.  Pulmonary:     Effort: Pulmonary effort is normal. No respiratory distress.  Abdominal:     General: There is no distension.     Palpations: Abdomen is soft.  Musculoskeletal:        General: Normal range of motion.  Cervical back: Normal range of motion and neck supple.     Comments: Minimal swelling with minimal erythema. No fluctuance or induration.   Skin:    General: Skin is warm and dry.     Comments: Compartments soft. 5mm scab to interdigit space.  Neurological:     General: No focal deficit present.     Mental Status: He is alert and oriented to person, place, and time.    ED Results / Procedures / Treatments   Labs (all labs ordered are listed, but only abnormal results are displayed) Labs Reviewed - No data to display  EKG None  Radiology DG Hand 2 View Right  Result Date: 12/21/2022 CLINICAL DATA:  Right hand penetrating trauma. EXAM: RIGHT HAND - 2 VIEW COMPARISON:  None Available. FINDINGS: There is no  evidence of fracture or dislocation. Severe degenerative changes seen involving the first interphalangeal joint as well as the third metacarpal phalangeal joint. Moderate narrowing of radiocarpal joint is noted as well. No radiopaque foreign body. Soft tissues are unremarkable. IMPRESSION: Osteoarthritis as described above.  No acute abnormality seen. Electronically Signed   By: Lupita Raider M.D.   On: 12/21/2022 12:41    Procedures Procedures    Medications Ordered in ED Medications - No data to display  ED Course/ Medical Decision Making/ A&P    62 year old here for evaluation of wound to right hand. Hit hand with drill bit yesterday. Seen in ED. Xray without fracture yesterday, personally viewed. No retained foreign body. Comparments soft. Mild soft tissue swelling. No streaking. NV intact. No fluctuance or induration. Do not feel needs repeat imaging. Will start on abx for possible early cellulitis. Will have FU with Ortho outpatient.   The patient has been appropriately medically screened and/or stabilized in the ED. I have low suspicion for any other emergent medical condition which would require further screening, evaluation or treatment in the ED or require inpatient management.  Patient is hemodynamically stable and in no acute distress.  Patient able to ambulate in department prior to ED.  Evaluation does not show acute pathology that would require ongoing or additional emergent interventions while in the emergency department or further inpatient treatment.  I have discussed the diagnosis with the patient and answered all questions.  Pain is been managed while in the emergency department and patient has no further complaints prior to discharge.  Patient is comfortable with plan discussed in room and is stable for discharge at this time.  I have discussed strict return precautions for returning to the emergency department.  Patient was encouraged to follow-up with PCP/specialist refer to  at discharge.                              Medical Decision Making Amount and/or Complexity of Data Reviewed External Data Reviewed: radiology and notes.  Risk OTC drugs. Prescription drug management. Decision regarding hospitalization. Diagnosis or treatment significantly limited by social determinants of health.           Final Clinical Impression(s) / ED Diagnoses Final diagnoses:  Injury of right hand, initial encounter    Rx / DC Orders ED Discharge Orders          Ordered    cephALEXin (KEFLEX) 500 MG capsule  2 times daily        12/22/22 2247              Merissa Renwick A,  PA-C 12/22/22 2248    HortonClabe Seal, DO 12/22/22 2310

## 2022-12-22 NOTE — Telephone Encounter (Signed)
Pt was called and vm was left, Information has been sent to nurse pool.   

## 2022-12-22 NOTE — Telephone Encounter (Signed)
-----   Message from Storm Frisk, MD sent at 12/21/2022  2:14 PM EDT ----- Let patient know cholesterol is very high we will start a cholesterol pill to take daily this was sent to his [facility]

## 2023-01-08 ENCOUNTER — Other Ambulatory Visit: Payer: Self-pay | Admitting: General Surgery

## 2023-03-17 ENCOUNTER — Other Ambulatory Visit: Payer: Self-pay | Admitting: Critical Care Medicine

## 2023-03-19 NOTE — Telephone Encounter (Signed)
Requested medications are due for refill today.  Unsure  Requested medications are on the active medications list.  no  Last refill. Medication was d/c'd 02/15/2023  Future visit scheduled.   no  Notes to clinic.  Please review    Requested Prescriptions  Pending Prescriptions Disp Refills   amLODipine (NORVASC) 5 MG tablet [Pharmacy Med Name: AMLODIPINE BESYLATE 5MG  TABLETS] 90 tablet 2    Sig: TAKE 1 TABLET(5 MG) BY MOUTH DAILY     Cardiovascular: Calcium Channel Blockers 2 Passed - 03/17/2023  6:35 AM      Passed - Last BP in normal range    BP Readings from Last 1 Encounters:  12/22/22 136/84         Passed - Last Heart Rate in normal range    Pulse Readings from Last 1 Encounters:  12/22/22 85         Passed - Valid encounter within last 6 months    Recent Outpatient Visits           2 months ago Inguinal hernia of right side without obstruction or gangrene   [facility] Storm Frisk, MD   1 year ago Abdominal pain, acute, epigastric   [facility] Storm Frisk, MD   1 year ago Abdominal pain, acute, epigastric   [place] [facility] Storm Frisk, MD   1 year ago Abdominal pain, acute, epigastric   [place] [facility] Storm Frisk, MD   1 year ago Acute recurrent maxillary sinusitis   [facility] Storm Frisk, MD

## 2023-05-18 ENCOUNTER — Encounter (HOSPITAL_COMMUNITY): Payer: Self-pay | Admitting: Emergency Medicine

## 2023-05-18 ENCOUNTER — Ambulatory Visit (HOSPITAL_COMMUNITY)
Admission: EM | Admit: 2023-05-18 | Discharge: 2023-05-18 | Disposition: A | Discharge status: Home / Self Care | Payer: Medicaid Other | Attending: Emergency Medicine | Admitting: Emergency Medicine

## 2023-05-18 ENCOUNTER — Ambulatory Visit (INDEPENDENT_AMBULATORY_CARE_PROVIDER_SITE_OTHER): Payer: Medicaid Other

## 2023-05-18 DIAGNOSIS — R21 Rash and other nonspecific skin eruption: Secondary | ICD-10-CM

## 2023-05-18 DIAGNOSIS — K219 Gastro-esophageal reflux disease without esophagitis: Secondary | ICD-10-CM | POA: Diagnosis not present

## 2023-05-18 DIAGNOSIS — J4521 Mild intermittent asthma with (acute) exacerbation: Secondary | ICD-10-CM

## 2023-05-18 DIAGNOSIS — R1084 Generalized abdominal pain: Secondary | ICD-10-CM | POA: Diagnosis not present

## 2023-05-18 MED ORDER — ALBUTEROL SULFATE HFA 108 (90 BASE) MCG/ACT IN AERS: 2.0000 | INHALATION_SPRAY | Freq: Four times a day (QID) | RESPIRATORY_TRACT | 0 refills | Status: DC | PRN

## 2023-05-18 MED ORDER — ALUM & MAG HYDROXIDE-SIMETH 200-200-20 MG/5ML PO SUSP
ORAL | Status: AC
  Filled 2023-05-18: qty 30

## 2023-05-18 MED ORDER — CLOTRIMAZOLE 1 % EX CREA: TOPICAL_CREAM | CUTANEOUS | 0 refills | Status: DC

## 2023-05-18 MED ORDER — LIDOCAINE VISCOUS HCL 2 % MT SOLN
OROMUCOSAL | Status: AC
  Filled 2023-05-18: qty 15

## 2023-05-18 MED ORDER — METRONIDAZOLE 0.75 % EX CREA: TOPICAL_CREAM | Freq: Two times a day (BID) | CUTANEOUS | 0 refills | Status: DC

## 2023-05-18 MED ORDER — ALUM & MAG HYDROXIDE-SIMETH 200-200-20 MG/5ML PO SUSP
30.0000 mL | Freq: Once | ORAL | Status: AC
  Administered 2023-05-18: 30 mL via ORAL

## 2023-05-18 MED ORDER — PREDNISONE 20 MG PO TABS: 40.0000 mg | ORAL_TABLET | Freq: Every day | ORAL | 0 refills | Status: AC

## 2023-05-18 MED ORDER — LIDOCAINE VISCOUS HCL 2 % MT SOLN
15.0000 mL | Freq: Once | OROMUCOSAL | Status: AC
  Administered 2023-05-18: 15 mL via OROMUCOSAL

## 2023-05-18 NOTE — Discharge Instructions (Addendum)
Your abdominal pain improved with our GI cocktail, I suspect it is acid reflux.  Please ensure you are watching your dietary triggers, eating small frequent meals, and staying upright after meals.  You can use Tums as needed for any breakthrough acid reflux.  Follow-up with your primary care provider if your symptoms persist.  For your penile rash I am covering you for bacterial and yeast pathogens.  Please apply both creams twice daily after cleansing your penis with warm water and a mild soap and patting dry.  Your chest x-ray did not show any signs of infection, I believe your shortness of breath and wheezing are due to an asthma exacerbation.  Continue to use your albuterol inhaler as needed and start the prednisone tablets tomorrow with breakfast.  Return to clinic for any new or urgent symptoms.

## 2023-05-18 NOTE — ED Provider Notes (Signed)
[facility]    CSN: [id number] Arrival date & time: 05/18/23  1602      History   Chief Complaint Chief Complaint  Patient presents with   Abdominal Pain    HPI Zachary [name] is a 62 y.o. male.   Patient presents to clinic for generalized abdominal pain, cough, wheezing and shortness of breath that has been for the past week.  He does have a history of asthma and has been using his inhaler over the past week.  Feels like his abdomen is bloated.  He did try Maalox and Mucinex.  He also has a rash at the tip of his penis that he is concerned about.  Thinks he might have been diagnosed with herpes in the past.  He denies constipation, nausea, vomiting or diarrhea.  There is no blood in his stool.  Denies any changes in his bowel movements.  No fevers.   The history is provided by the patient and medical records.  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   Anxiety and depression 12/27/2021   Allergic rhinitis 11/15/2020   Inguinal hernia of right side without obstruction or gangrene 03/17/2020   Periodontal disease 03/17/2020   Dental caries 03/17/2020   Mild intermittent asthma without complication 03/17/2020   Gastroesophageal reflux disease without esophagitis 03/17/2020   Chronic pain of right knee 03/17/2020    Past Surgical History:  Procedure Laterality Date   BIOPSY  06/07/2022   Procedure: BIOPSY;  Surgeon: Lemar Lofty., MD;  Location: [facility];   Service: Gastroenterology;;   ENDOSCOPIC RETROGRADE CHOLANGIOPANCREATOGRAPHY (ERCP) WITH PROPOFOL N/A 06/27/2022   Procedure: ENDOSCOPIC RETROGRADE CHOLANGIOPANCREATOGRAPHY (ERCP) WITH PROPOFOL;  Surgeon: Lemar Lofty., MD;  Location: [facility];  Service: Gastroenterology;  Laterality: N/A;   ESOPHAGOGASTRODUODENOSCOPY (EGD) WITH PROPOFOL N/A 06/07/2022   Procedure: ESOPHAGOGASTRODUODENOSCOPY (EGD) WITH PROPOFOL;  Surgeon: Meridee Score Netty Starring., MD;  Location: [facility];  Service: Gastroenterology;  Laterality: N/A;   ESOPHAGOGASTRODUODENOSCOPY ENDOSCOPY  10/11/2021   EUS N/A 06/07/2022   Procedure: UPPER ENDOSCOPIC ULTRASOUND (EUS) RADIAL;  Surgeon: Lemar Lofty., MD;  Location: [facility];  Service: Gastroenterology;  Laterality: N/A;   KNEE ARTHROSCOPY     x 2   RADIOLOGY WITH ANESTHESIA N/A 03/28/2022   Procedure: MRI ABDOMEN MRCP WITH AND WITHOUT CONTRAST WITH ANESTHESIA;  Surgeon: Radiologist, Medication, MD;  Location: [facility];  Service: Radiology;  Laterality: N/A;   UPPER GASTROINTESTINAL ENDOSCOPY  12/29/2021   [name]       Home Medications    Prior to Admission medications   Medication Sig Start Date End Date Taking? Authorizing Provider  clotrimazole (LOTRIMIN) 1 % cream Apply to affected area 2 times daily 05/18/23  Yes Rinaldo Ratel, [place] N, FNP  metroNIDAZOLE (METROCREAM) 0.75 % cream Apply topically 2 (two) times daily. 05/18/23  Yes Rinaldo Ratel, [place] N, FNP  predniSONE (DELTASONE) 20 MG tablet Take 2 tablets (40 mg total) by mouth daily for 5 days.  05/18/23 05/23/23 Yes Rinaldo Ratel, [place] N, FNP  albuterol (VENTOLIN HFA) 108 (90 Base) MCG/ACT inhaler Inhale 2 puffs into the lungs every 6 (six) hours as needed for wheezing or shortness of breath (cough). 05/18/23   Rhonna Holster, [place] N, FNP  alum & mag hydroxide-simeth (MAALOX/MYLANTA) 200-200-20 MG/5ML suspension Take 15 mLs by mouth every 6 (six) hours as needed (Stomach issure).    [provider]  atorvastatin (LIPITOR) 10 MG tablet Take 1 tablet (10 mg total) by mouth daily. 12/21/22   Storm Frisk, MD  famotidine (PEPCID) 20 MG tablet Take 1 tablet (20 mg total) by mouth daily. 12/17/22   Rising, Lurena Joiner, PA-C  pantoprazole (PROTONIX) 40 MG tablet Take 1 tablet (40 mg total) by mouth daily. 10/17/22   Imogene Burn, MD  sucralfate (CARAFATE) 1 g tablet Take 1 tablet (1 g total) by mouth 4 (four) times daily -  with meals and at bedtime. Patient taking differently: Take 1 g by mouth 4 (four) times daily as needed (stomach issues.). 12/24/21   Tegeler, Canary Brim, MD    Family History Family History  Problem Relation Age of Onset   Hypertension Mother    Cancer Father    Colon cancer Maternal Uncle    Colon cancer Cousin    Cancer Other    Esophageal cancer Neg Hx    Inflammatory bowel disease Neg Hx    Liver disease Neg Hx    Pancreatic cancer Neg Hx    Rectal cancer Neg Hx    Stomach cancer Neg Hx     Social History Social History   Tobacco Use   Smoking status: Former   Smokeless tobacco: Never  Vaping Use   Vaping status: Never Used  Substance Use Topics   Alcohol use: No   Drug use: No     Allergies   Patient has no known allergies.   Review of Systems Review of Systems  Constitutional:  Negative for fatigue and fever.  HENT:  Negative for congestion and sore throat.   Respiratory:  Positive for cough, shortness of breath and wheezing.   Cardiovascular:  Negative for chest pain.  Gastrointestinal:  Positive for abdominal distention and abdominal pain. Negative for diarrhea, nausea and vomiting.  Genitourinary:  Negative for dysuria.     Physical Exam Triage Vital Signs ED Triage Vitals  Encounter Vitals Group     BP 05/18/23 1640 119/81     Systolic BP Percentile --      Diastolic BP Percentile --      Pulse Rate 05/18/23 1640 82     Resp 05/18/23 1640 16     Temp 05/18/23 1640 98.4 F (36.9 C)     Temp src --      SpO2  05/18/23 1640 94 %     Weight --      Height --      Head Circumference --      Peak Flow --      Pain Score 05/18/23 1638 8     Pain Loc --      Pain Education --      Exclude from Growth Chart --    No data found.  Updated Vital Signs BP 119/81 (BP Location: Left Arm)   Pulse 82   Temp 98.4 F (36.9 C)   Resp 16   SpO2 94%   Visual Acuity Right Eye Distance:   Left Eye Distance:   Bilateral Distance:    Right Eye Near:  Left Eye Near:    Bilateral Near:     Physical Exam Vitals and nursing note reviewed. Exam conducted with a chaperone present.  Constitutional:      Appearance: He is well-developed.  HENT:     Head: Normocephalic and atraumatic.     Mouth/Throat:     Mouth: Mucous membranes are moist.  Cardiovascular:     Rate and Rhythm: Normal rate and regular rhythm.     Heart sounds: Normal heart sounds. No murmur heard. Pulmonary:     Effort: Pulmonary effort is normal.     Breath sounds: Wheezing present.  Abdominal:     General: Bowel sounds are normal. There is distension.     Palpations: Abdomen is soft.     Tenderness: There is generalized abdominal tenderness.  Genitourinary:    Penis: Circumcised.   Skin:    General: Skin is warm and dry.  Neurological:     General: No focal deficit present.     Mental Status: He is alert and oriented to person, place, and time.  Psychiatric:        Mood and Affect: Mood normal.        Behavior: Behavior normal.      UC Treatments / Results  Labs (all labs ordered are listed, but only abnormal results are displayed) Labs Reviewed - No data to display  EKG   Radiology DG Chest 2 View  Result Date: 05/18/2023 CLINICAL DATA:  Shortness of breath.  Cough.  Wheezing. EXAM: CHEST - 2 VIEW COMPARISON:  12/24/2021 FINDINGS: The heart size and mediastinal contours are within normal limits. Both lungs are clear. The visualized skeletal structures are unremarkable. IMPRESSION: No active cardiopulmonary  disease. Electronically Signed   By: Danae Orleans M.D.   On: 05/18/2023 18:27    Procedures Procedures (including critical care time)  Medications Ordered in UC Medications  alum & mag hydroxide-simeth (MAALOX/MYLANTA) 200-200-20 MG/5ML suspension 30 mL (30 mLs Oral Given 05/18/23 1801)  lidocaine (XYLOCAINE) 2 % viscous mouth solution 15 mL (15 mLs Mouth/Throat Given 05/18/23 1801)    Initial Impression / Assessment and Plan / UC Course  I have reviewed the triage vital signs and the nursing notes.  Pertinent labs & imaging results that were available during my care of the patient were reviewed by me and considered in my medical decision making (see chart for details).  Vitals and triage reviewed, patient is hemodynamically stable.  Lungs with diminished sounds in lower lobes and some expiratory wheezing.  Heart with regular rate and rhythm.  Abdomen with active bowel sounds, does appear to be slightly distended with generalized tenderness.  No rebound or guarding. No current chest pain.   GI cocktail markedly improved abdominal pain, suspect acid reflux.  CXR negative for acute infiltrates.  Lungs are slightly diminished in lower lobes with some mild expiratory wheezing.  Suspect asthma exacerbation, advised albuterol inhaler and started on 5 days of prednisone.  Chaperone present for examination of distal penile rash that is beefy and erythematous, suspect balanitis. No obvious vesicles.  Prescribed metronidazole and clotrimazole gel to be applied twice daily to cover for bacterial and candidal infections.  Plan of care, follow-up care and return precautions given, no questions at this time.     Final Clinical Impressions(s) / UC Diagnoses   Final diagnoses:  Generalized abdominal pain  Mild intermittent asthma with acute exacerbation  Penile rash  Gastroesophageal reflux disease without esophagitis     Discharge Instructions  Your abdominal pain improved with our GI  cocktail, I suspect it is acid reflux.  Please ensure you are watching your dietary triggers, eating small frequent meals, and staying upright after meals.  You can use Tums as needed for any breakthrough acid reflux.  Follow-up with your primary care provider if your symptoms persist.  For your penile rash I am covering you for bacterial and yeast pathogens.  Please apply both creams twice daily after cleansing your penis with warm water and a mild soap and patting dry.  Your chest x-ray did not show any signs of infection, I believe your shortness of breath and wheezing are due to an asthma exacerbation.  Continue to use your albuterol inhaler as needed and start the prednisone tablets tomorrow with breakfast.  Return to clinic for any new or urgent symptoms.      ED Prescriptions     Medication Sig Dispense Auth. Provider   albuterol (VENTOLIN HFA) 108 (90 Base) MCG/ACT inhaler Inhale 2 puffs into the lungs every 6 (six) hours as needed for wheezing or shortness of breath (cough). 18 g Rinaldo Ratel, [place] N, [place]   predniSONE (DELTASONE) 20 MG tablet Take 2 tablets (40 mg total) by mouth daily for 5 days. 10 tablet Rinaldo Ratel, [place] N, [place]   clotrimazole (LOTRIMIN) 1 % cream Apply to affected area 2 times daily 15 g Rinaldo Ratel, [place] N, [place]   metroNIDAZOLE (METROCREAM) 0.75 % cream Apply topically 2 (two) times daily. 45 g Darianna Amy, [place] N, [place]      PDMP not reviewed this encounter.   Jdyn Parkerson, [place] N, [place] 05/18/23 [phone]

## 2023-05-18 NOTE — ED Triage Notes (Signed)
Pt c/o "cold in my stomach" reports pain intermittent and bloated. Taking Maalox and tool Mucinex.   Pt reports broke out in right groin area for a week. Reports happened before and got prescribed pills and cream.

## 2023-07-14 IMAGING — CT CT HEAD W/O CM
3 of 4 series · 13 of 47 positions shown, 15 images · non-contrast
Comparison: CT head 03/26/2005

CLINICAL DATA: Neuro deficit, stroke suspected



[Series 2: head without · axial · non-contrast · 0.39mm/px · z∈[+1173,+1288]mm · 7 of 31 slices shown, 9 images]
[im 4/31  brain]
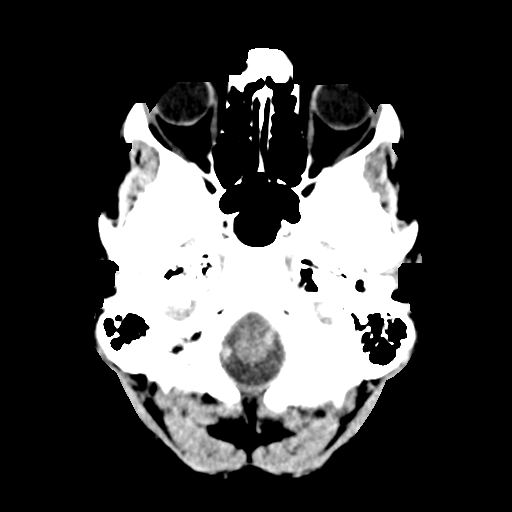
[im 4/31  bone]
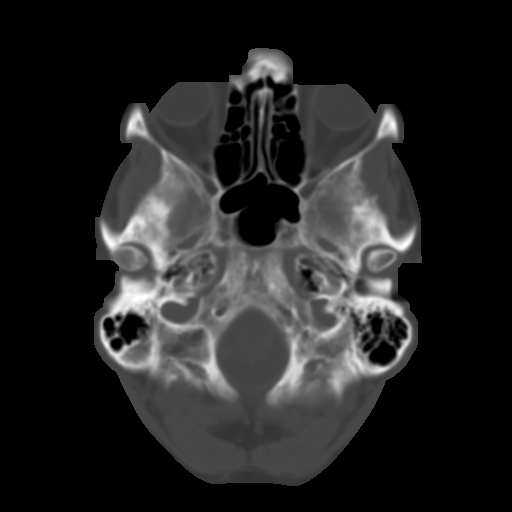
[im 8/31  brain]
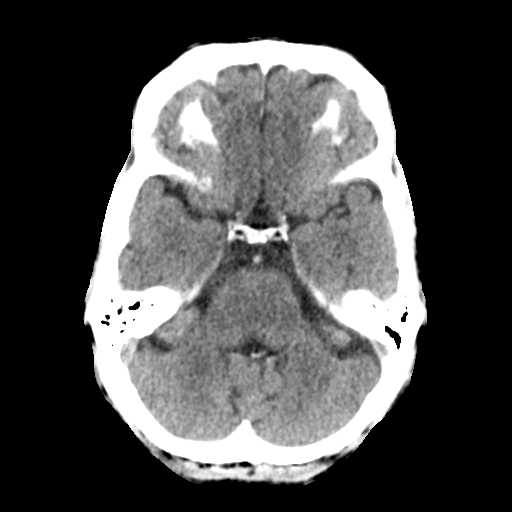
[im 12/31  brain]
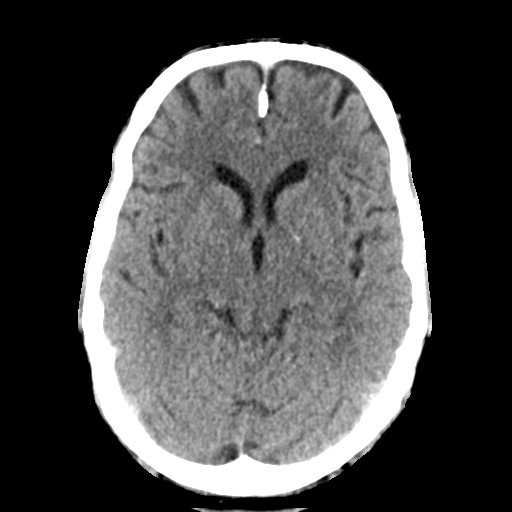
[im 16/31  brain]
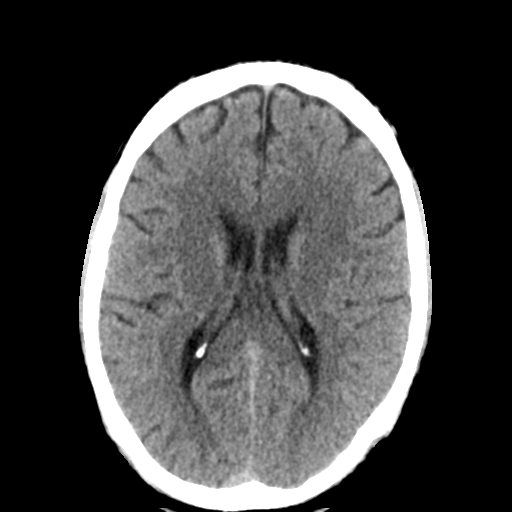
[im 19/31  brain]
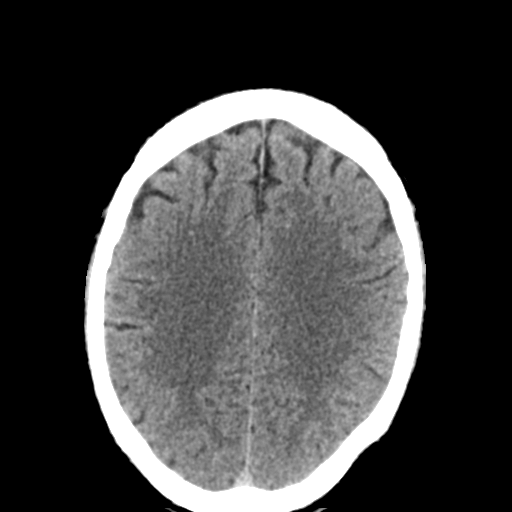
[im 19/31  bone]
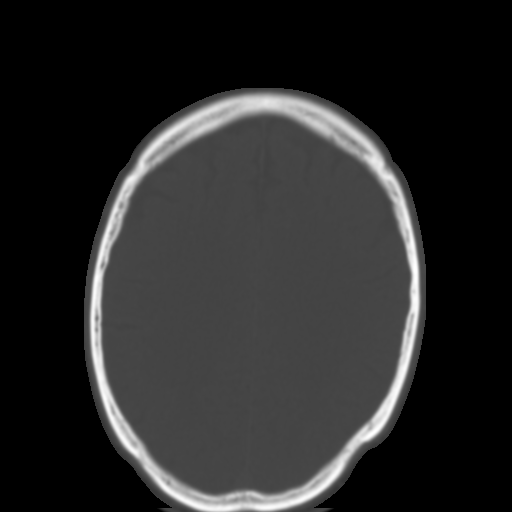
[im 23/31  brain]
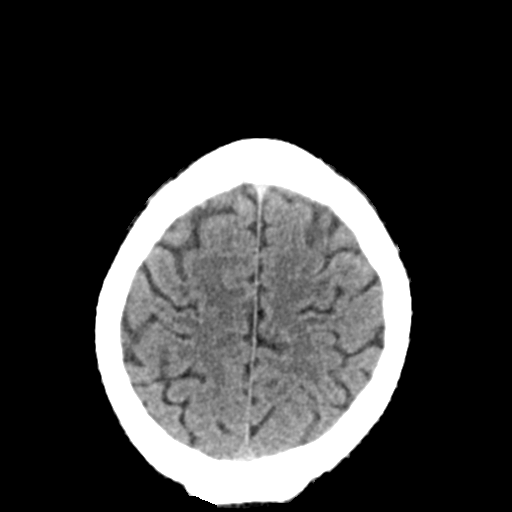
[im 27/31  brain]
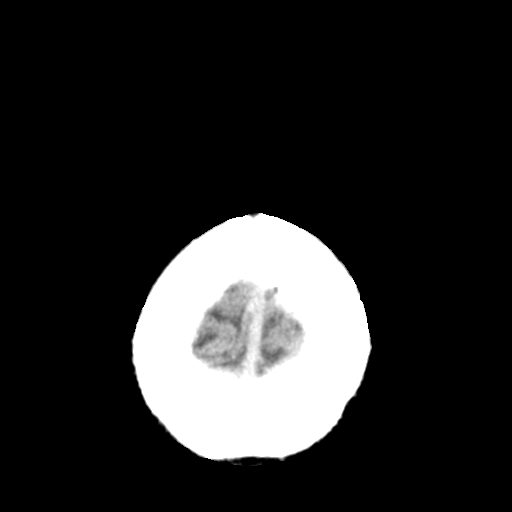

[Series 4: head without cor · coronal · non-contrast · 0.30mm/px · 3 of 67 slices shown]
[im 23/67  brain]
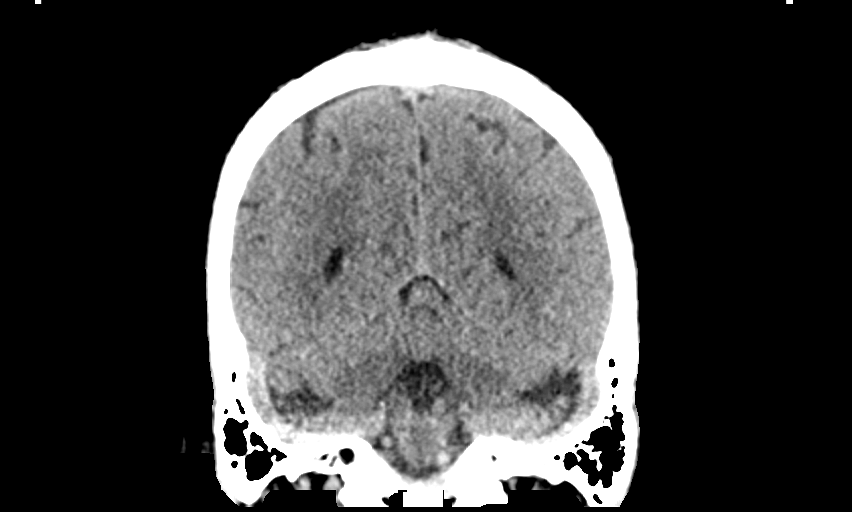
[im 30/67  brain]
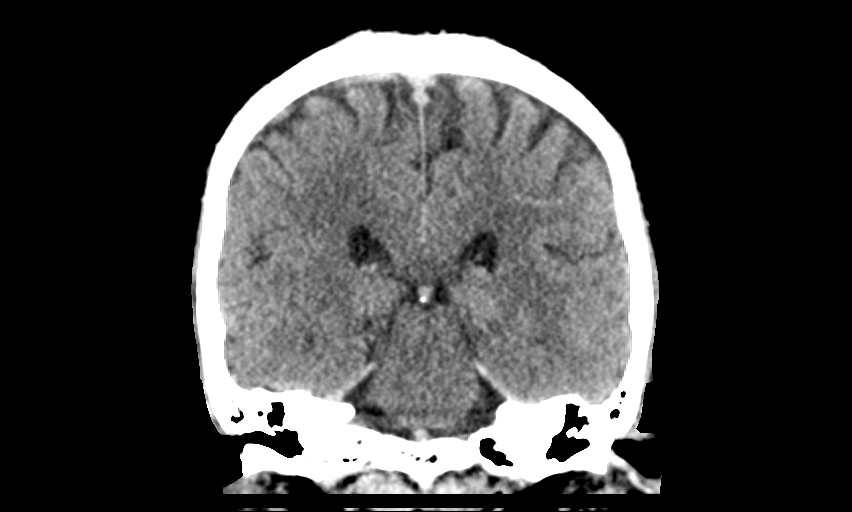
[im 37/67  brain]
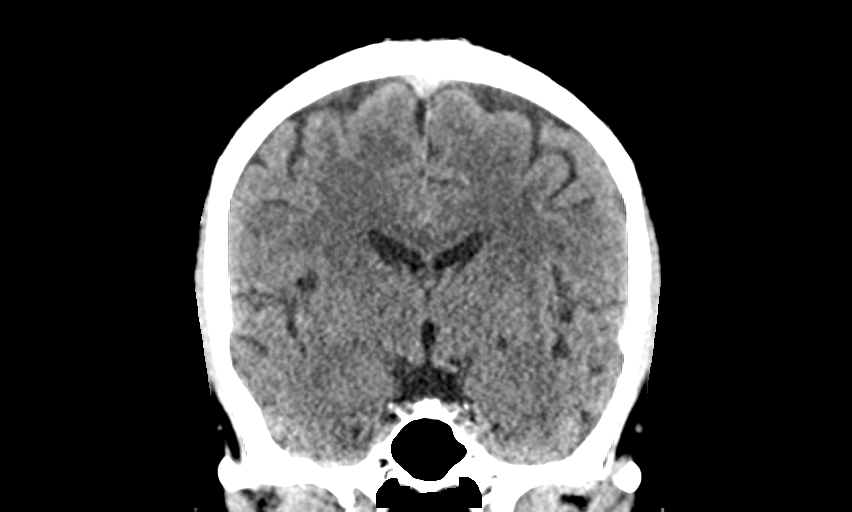

[Series 5: head without sag · sagittal · non-contrast · 0.30mm/px · 3 of 67 slices shown]
[im 23/67  brain]
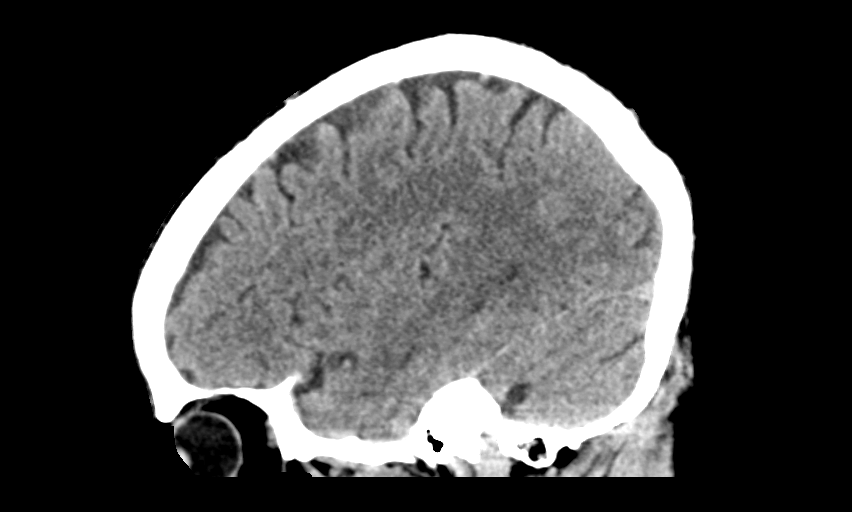
[im 34/67  brain]
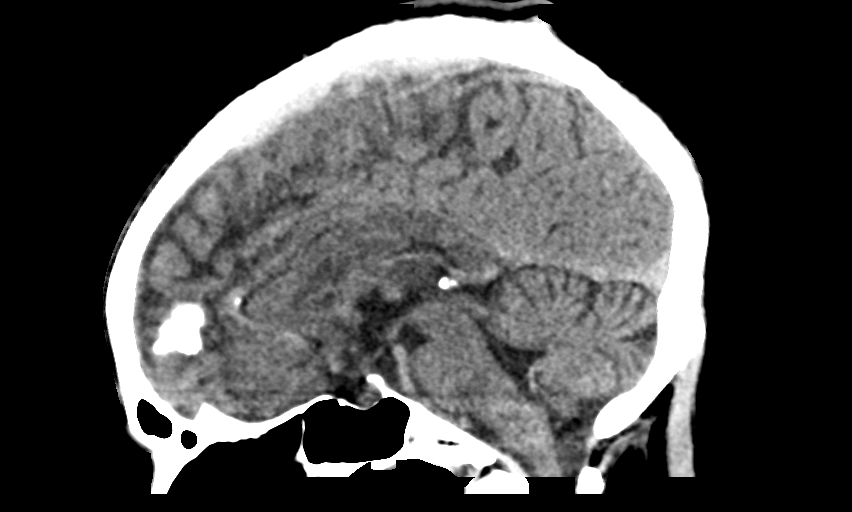
[im 45/67  brain]
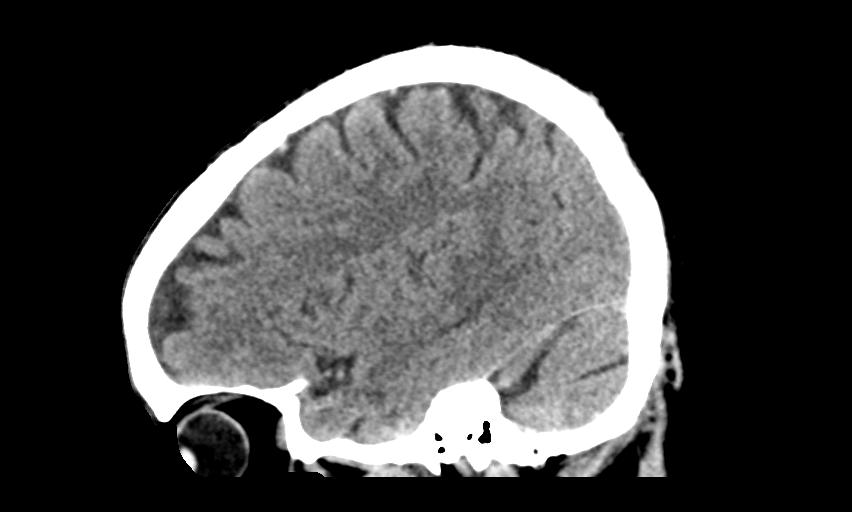

[13 of 47 positions shown; findings below may reference images not displayed]

FINDINGS: Brain: There is no evidence of acute intracranial hemorrhage,
extra-axial fluid collection, or acute infarct.

Parenchymal volume is normal. The ventricles are normal in size.
Gray-white differentiation is preserved. There is no mass lesion.
There is no mass effect or midline shift.

Vascular: No hyperdense vessel or unexpected calcification.

Skull: Normal. Negative for fracture or focal lesion.

Sinuses/Orbits: The imaged paranasal sinuses are clear. The globes
and orbits are unremarkable.

Other: None.
IMPRESSION: No acute intracranial pathology.

## 2023-07-19 ENCOUNTER — Emergency Department (HOSPITAL_COMMUNITY)
Admission: EM | Admit: 2023-07-19 | Discharge: 2023-07-19 | Disposition: A | Discharge status: Home / Self Care | Payer: Medicaid Other | Attending: Emergency Medicine | Admitting: Emergency Medicine

## 2023-07-19 ENCOUNTER — Emergency Department (HOSPITAL_COMMUNITY): Payer: Medicaid Other

## 2023-07-19 ENCOUNTER — Other Ambulatory Visit: Payer: Self-pay

## 2023-07-19 DIAGNOSIS — J45909 Unspecified asthma, uncomplicated: Secondary | ICD-10-CM | POA: Insufficient documentation

## 2023-07-19 DIAGNOSIS — R0981 Nasal congestion: Secondary | ICD-10-CM | POA: Insufficient documentation

## 2023-07-19 DIAGNOSIS — J3489 Other specified disorders of nose and nasal sinuses: Secondary | ICD-10-CM | POA: Insufficient documentation

## 2023-07-19 DIAGNOSIS — R059 Cough, unspecified: Secondary | ICD-10-CM | POA: Diagnosis not present

## 2023-07-19 DIAGNOSIS — R067 Sneezing: Secondary | ICD-10-CM | POA: Diagnosis not present

## 2023-07-19 IMAGING — CR DG CHEST 2V
2 series · 2 of 2 positions shown · non-contrast
Comparison: 12/19/2021.

CLINICAL DATA: Epigastric pain and burning in chest since [REDACTED]
accompanied by sob.

EXAM:
CHEST - 2 VIEW

[chest pa]
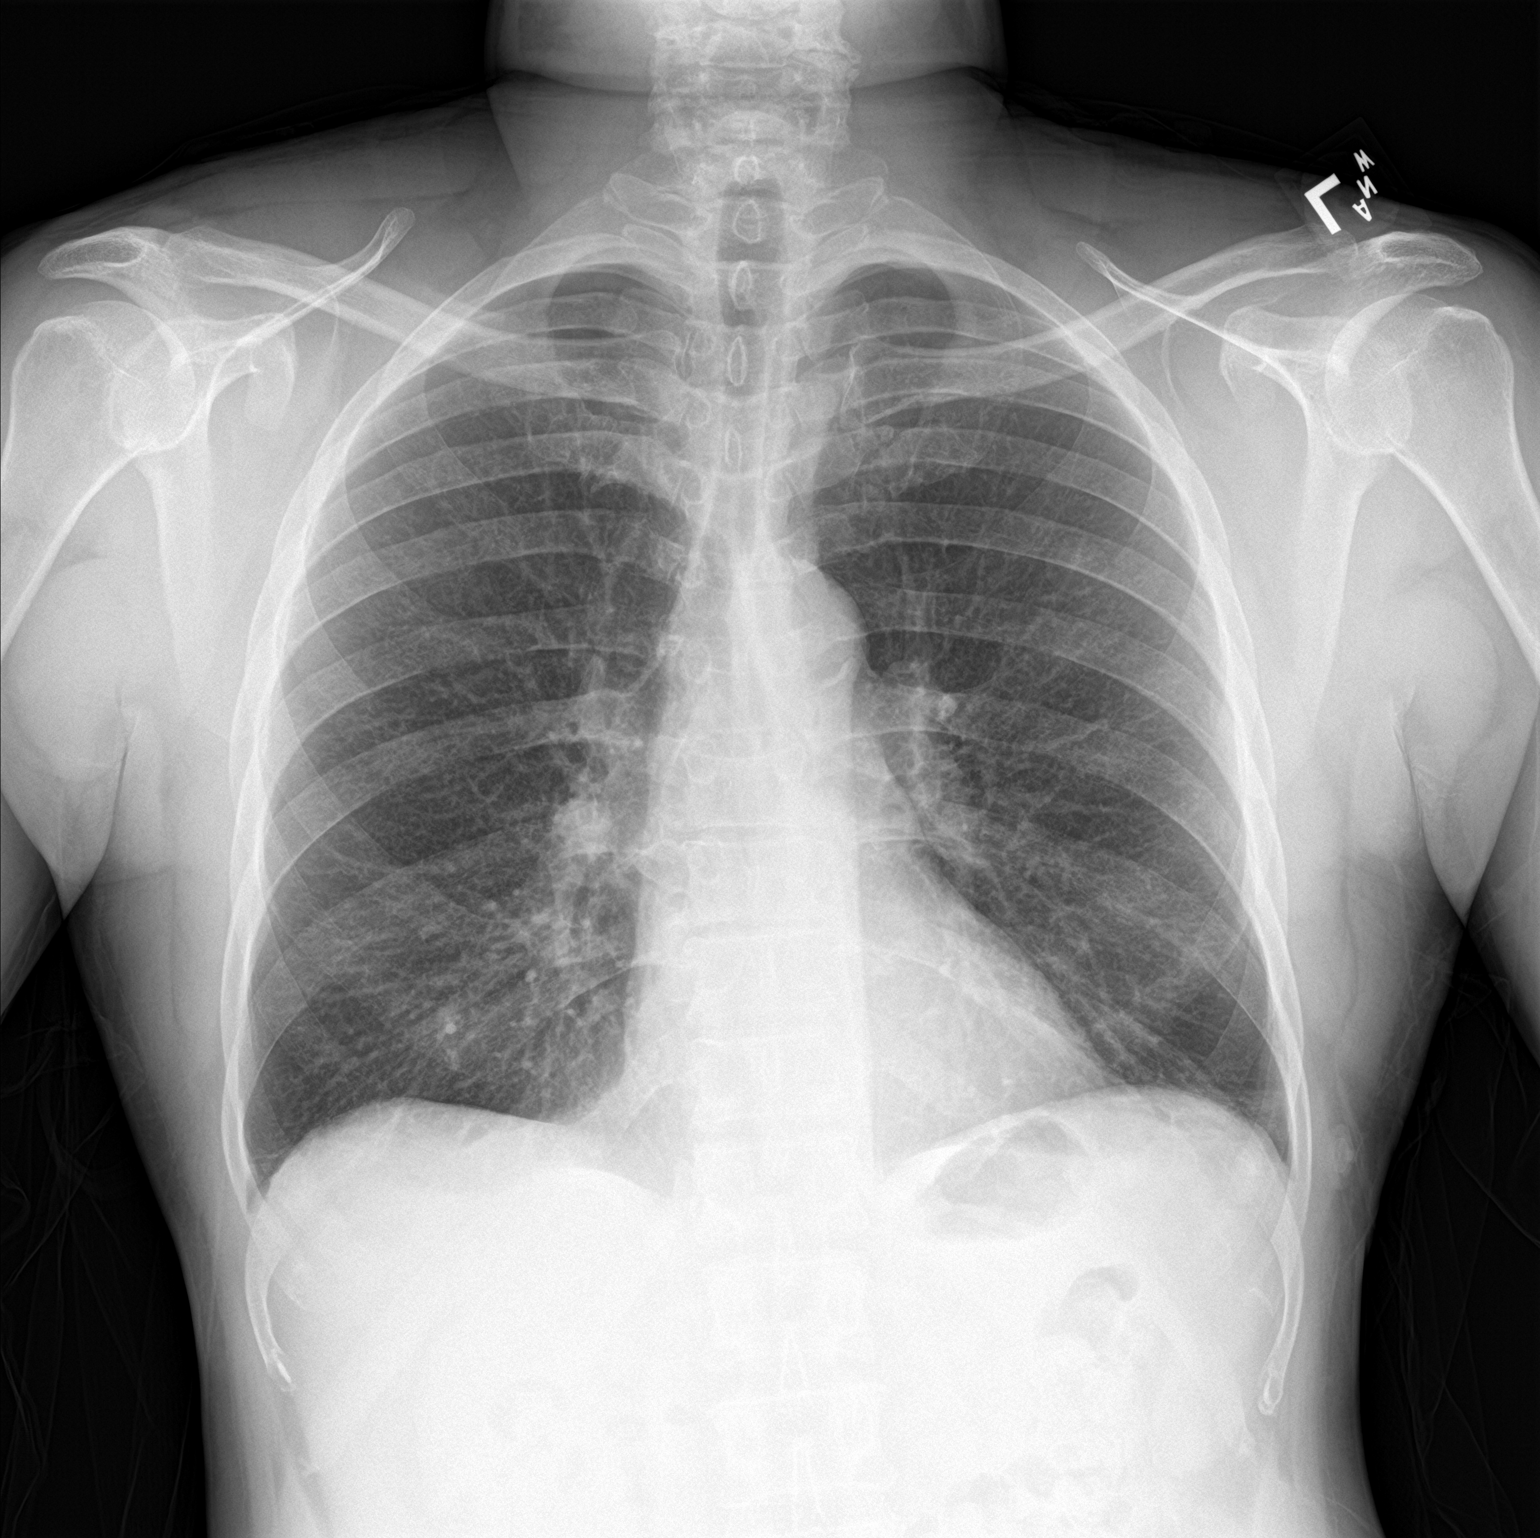

[chest lat]
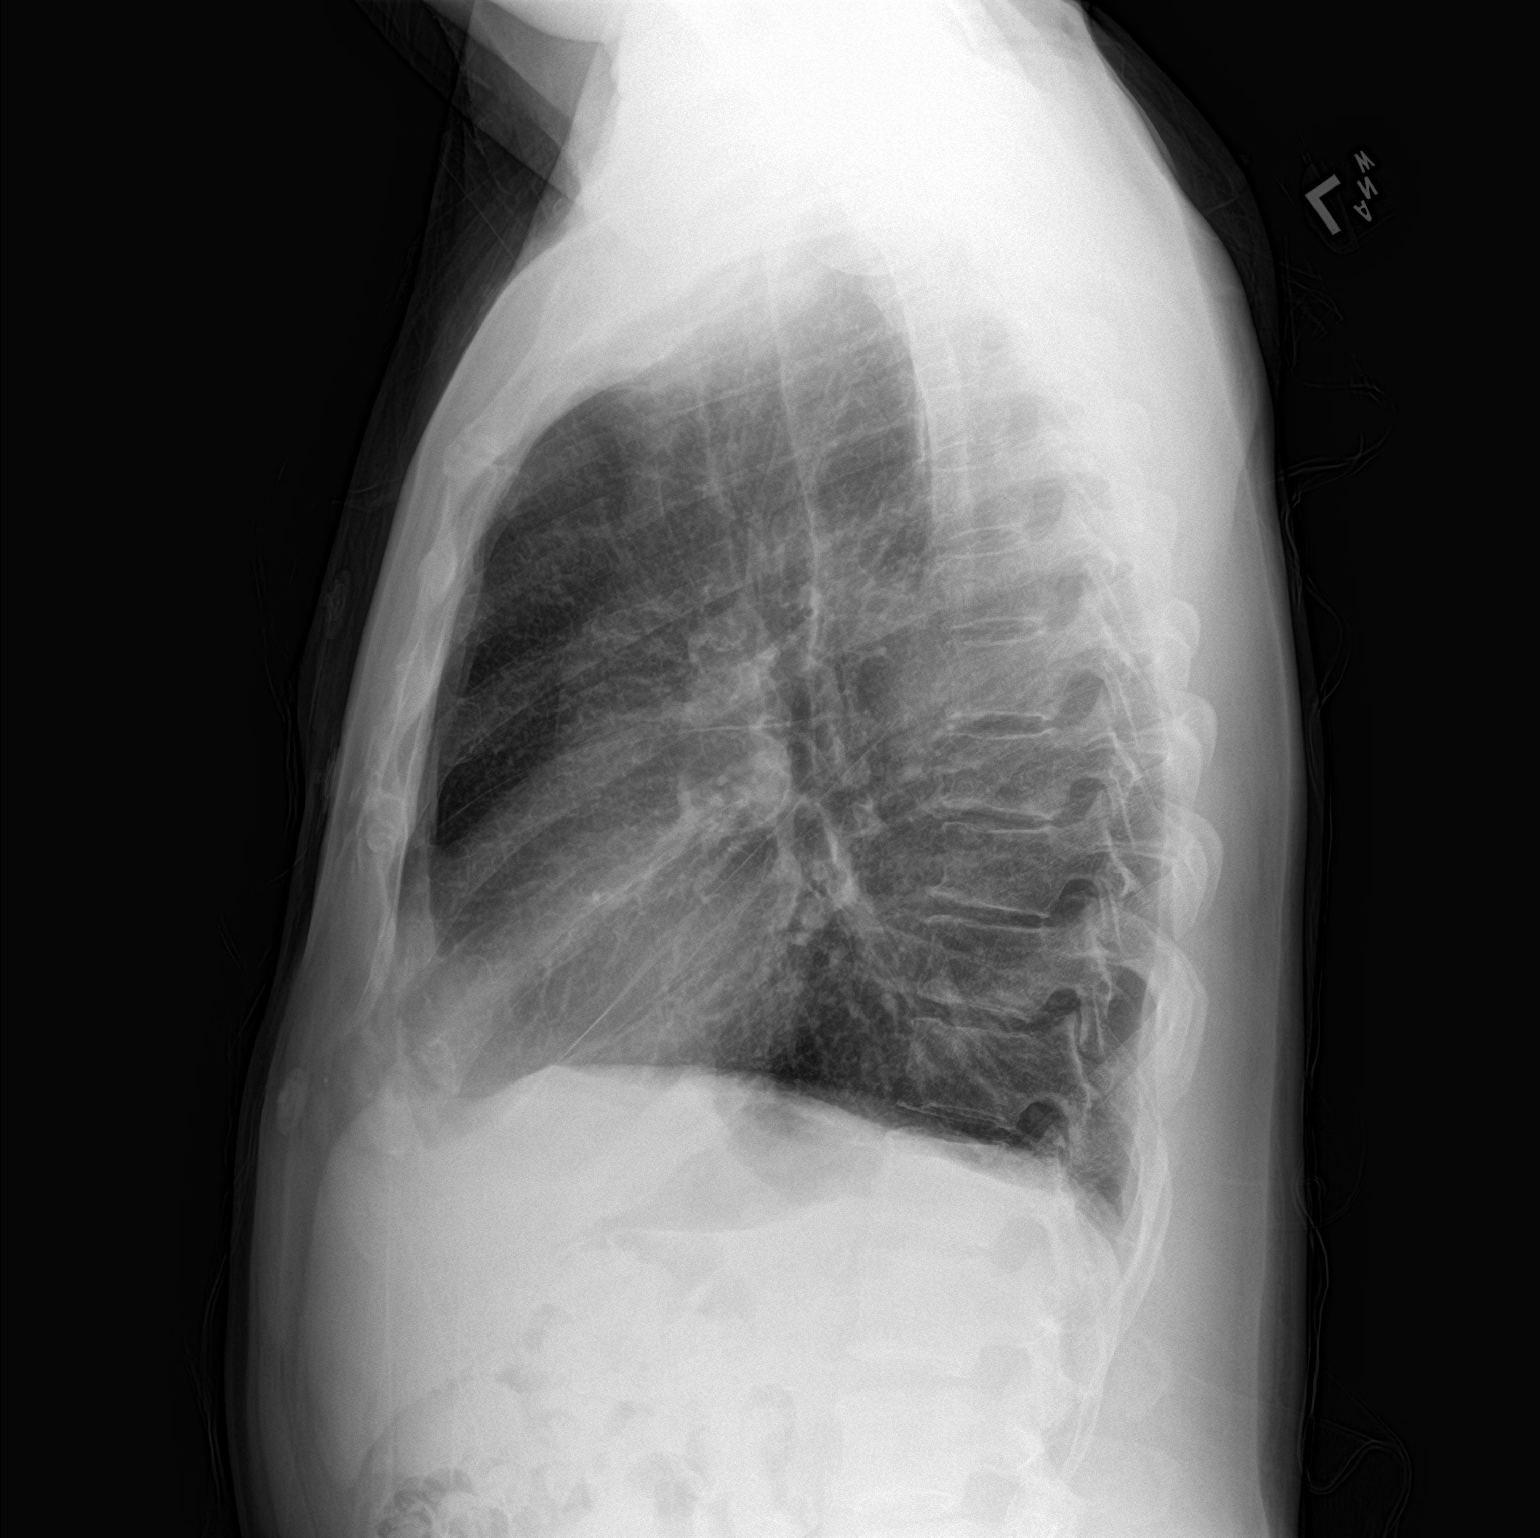

[2 of 2 positions shown; findings below may reference images not displayed]

FINDINGS: Normal heart, mediastinum and hila.

Clear lungs.  No pleural effusion or pneumothorax.

Skeletal structures are intact.
IMPRESSION: No active cardiopulmonary disease.

## 2023-07-19 IMAGING — CT CT ANGIO CHEST-ABD-PELV FOR DISSECTION W/ AND WO/W CM
2 of 7 series · 14 of 46 positions shown, 16 images · IV contrast (OMNI 350)
Comparison: None.

CLINICAL DATA: Chest pain or back pain, aortic dissection suspected
Previous abdominal pain, now pain in chest and between shoulder
blades with blood pressure in the 150s. Rule out acute dissection or
other intrathoracic abnormality causing symptoms.

EXAM:
CT ANGIOGRAPHY CHEST, ABDOMEN AND PELVIS
TECHNIQUE: Non-contrast CT of the chest was initially obtained.

[Series 8: dissection 2mm · axial · 0.79mm/px · z∈[-434,+98]mm · 11 of 300 slices shown, 13 images]
[im 17/300  soft-tissue]
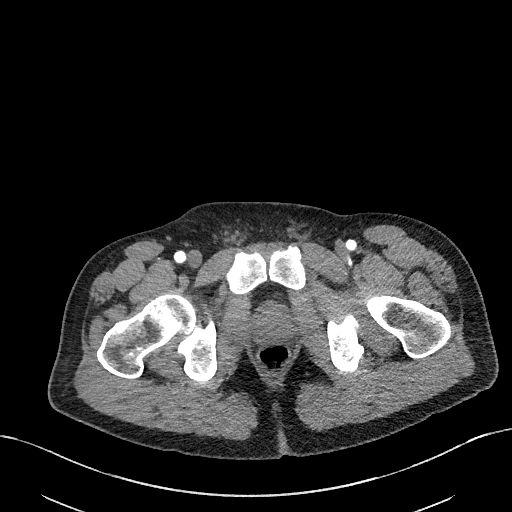
[im 17/300  bone]
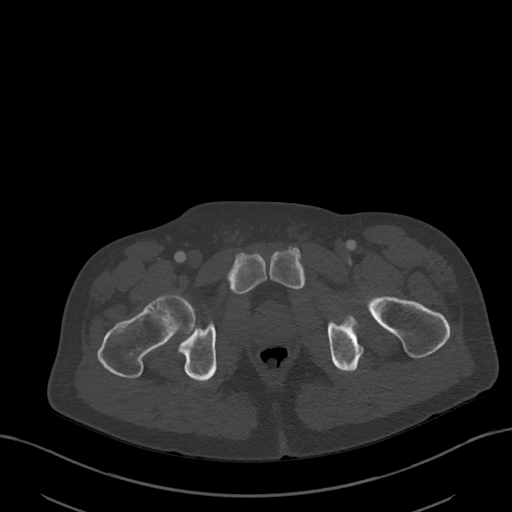
[im 50/300  soft-tissue]
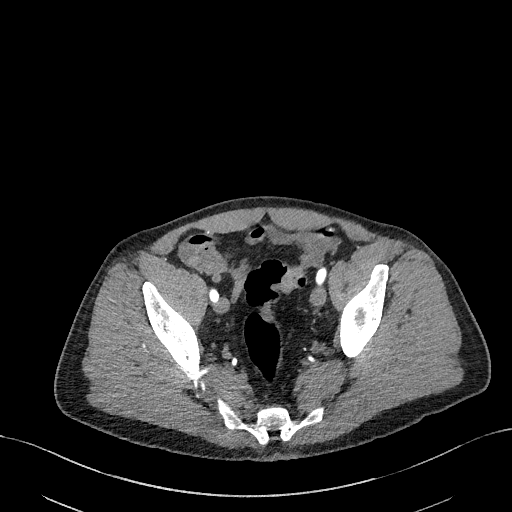
[im 67/300  soft-tissue]
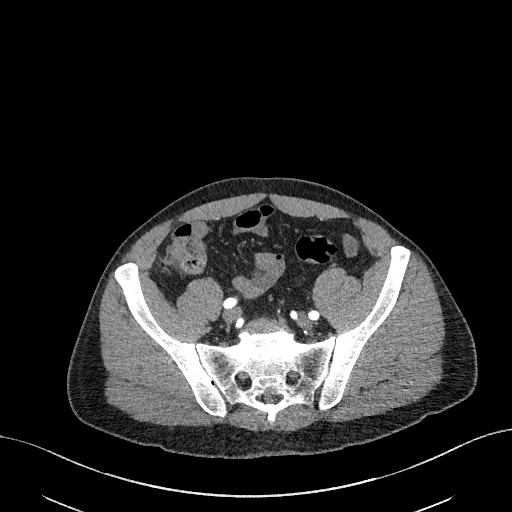
[im 100/300  soft-tissue]
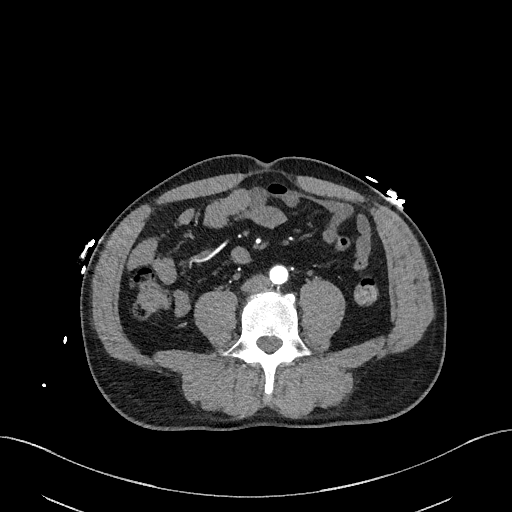
[im 117/300  soft-tissue]
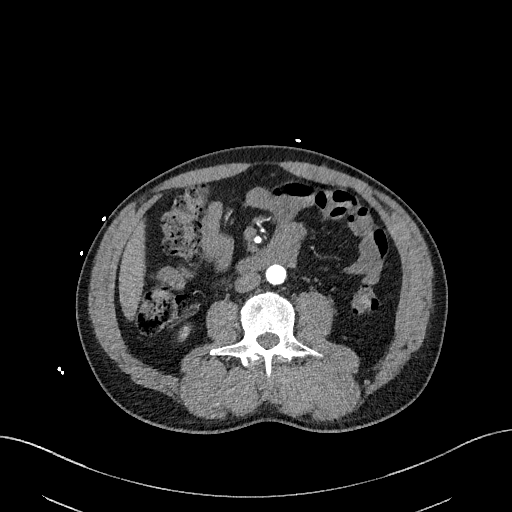
[im 150/300  soft-tissue]
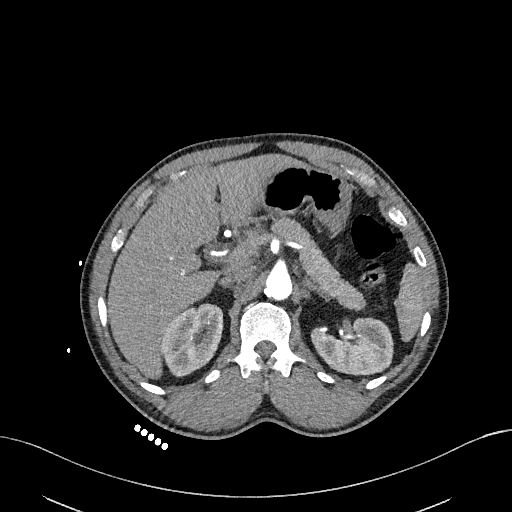
[im 183/300  soft-tissue]
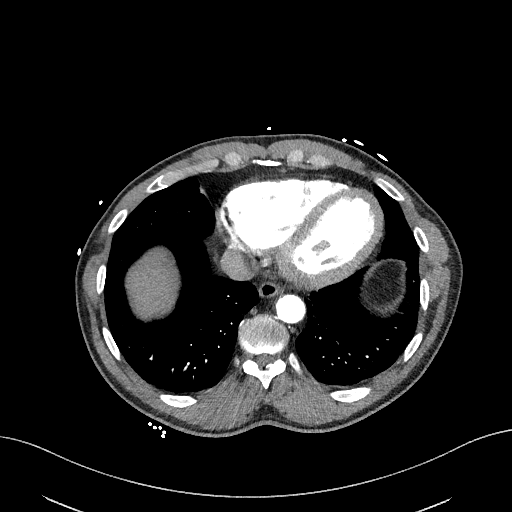
[im 200/300  soft-tissue]
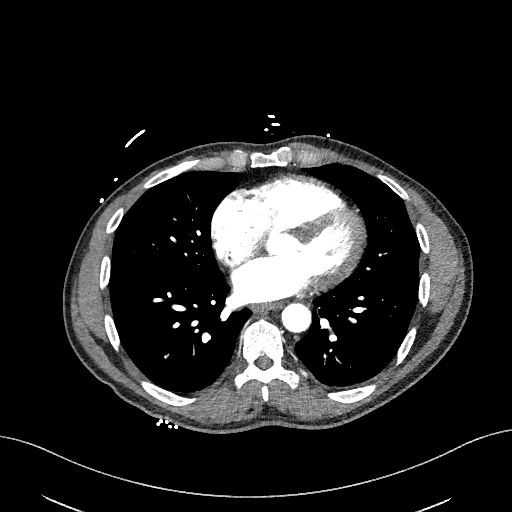
[im 233/300  soft-tissue]
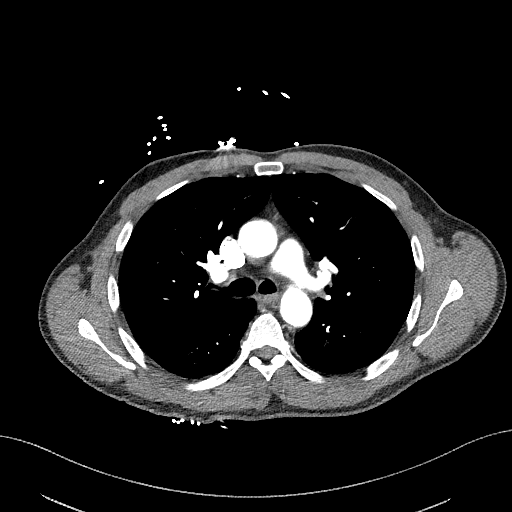
[im 233/300  bone]
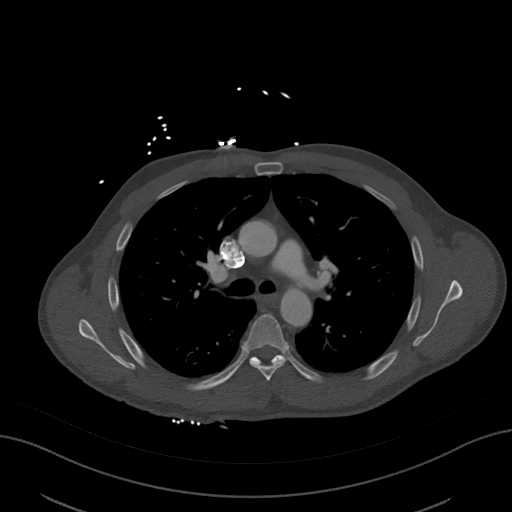
[im 250/300  soft-tissue]
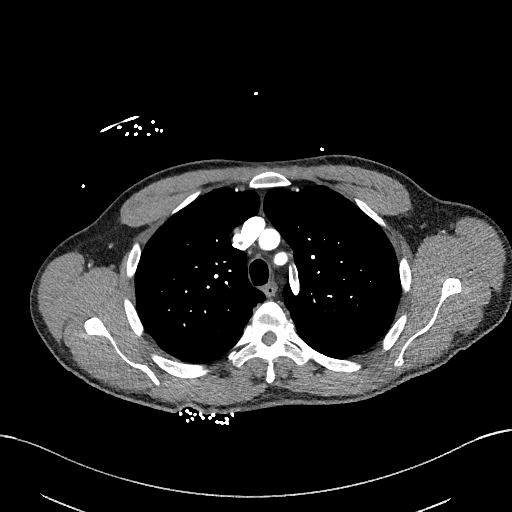
[im 283/300  soft-tissue]
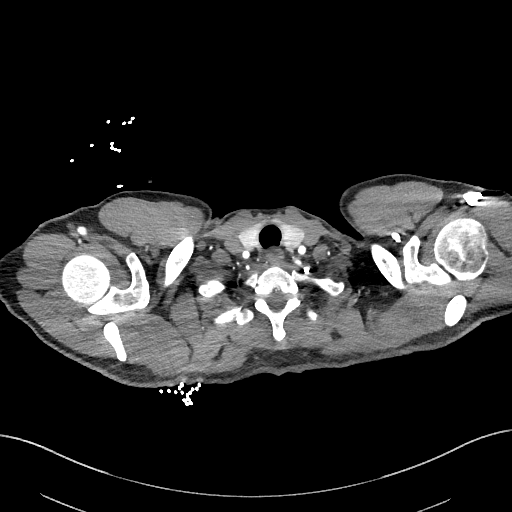

[Series 11: dissection 2mm cor · coronal · 0.78mm/px · 3 of 151 slices shown]
[im 38/151  soft-tissue]
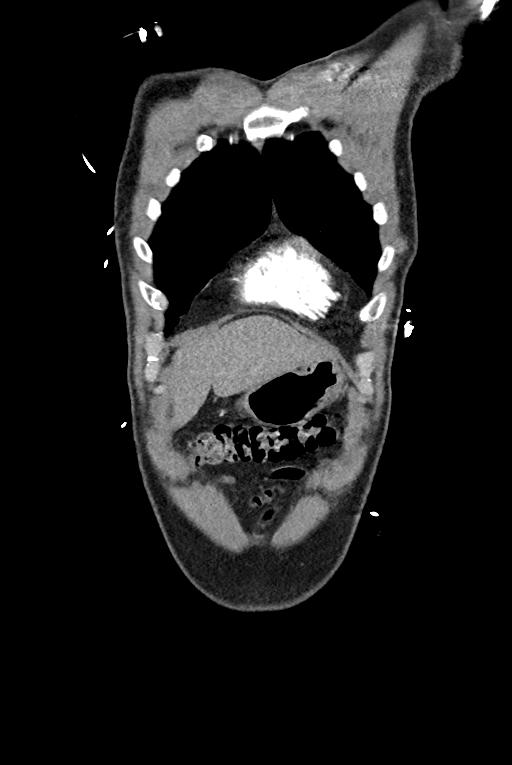
[im 76/151  soft-tissue]
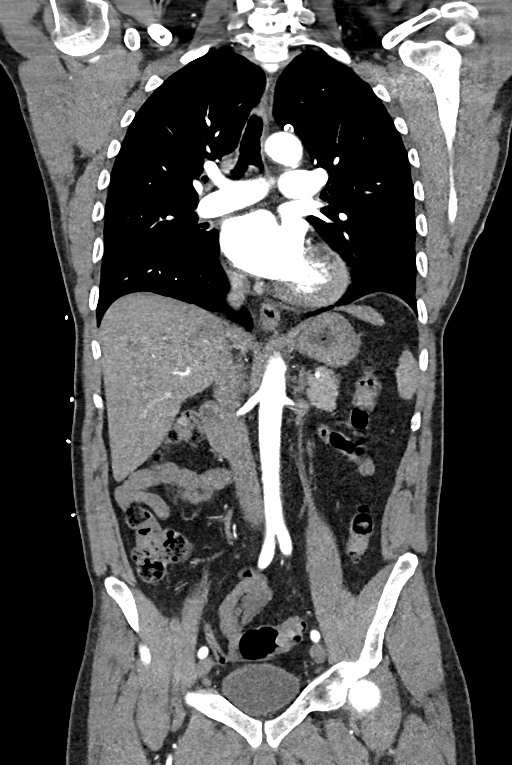
[im 113/151  soft-tissue]
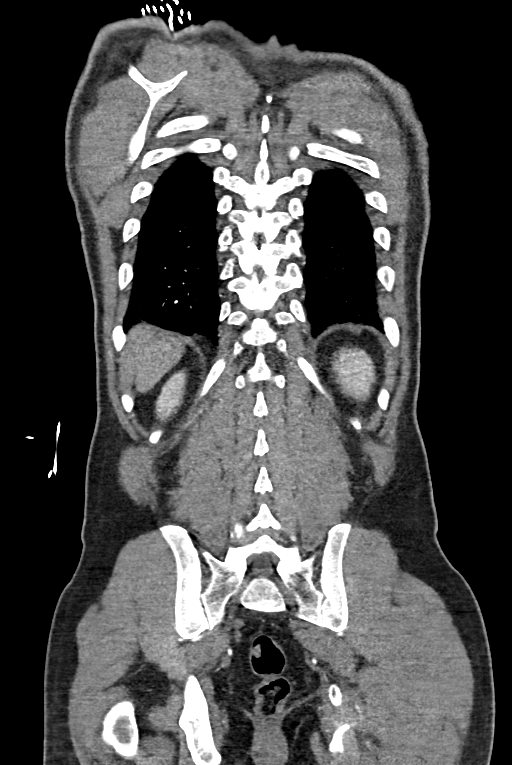

[14 of 46 positions shown; findings below may reference images not displayed]

Multidetector CT imaging through the chest, abdomen and pelvis was
performed using the standard protocol during bolus administration of
intravenous contrast. Multiplanar reconstructed images and MIPs were
obtained and reviewed to evaluate the vascular anatomy.

RADIATION DOSE REDUCTION: This exam was performed according to the
departmental dose-optimization program which includes automated
exposure control, adjustment of the mA and/or kV according to
patient size and/or use of iterative reconstruction technique.

CONTRAST:  100mL OMNIPAQUE IOHEXOL 350 MG/ML SOLN
FINDINGS: CTA CHEST

Cardiovascular: Preferential opacification of the thoracic aorta. No
evidence of thoracic aortic aneurysm or dissection. Note is made of
a common origin of the right brachiocephalic and left common carotid
arteries from the aortic arch. Minimal atherosclerotic plaque along
the aortic arch. The pulmonary arteries are normal in caliber
without central filling defect. Normal heart size. No pericardial
effusion.

Mediastinum/Nodes: No enlarged mediastinal, hilar, or axillary lymph
nodes. The thyroid gland appears normal.

Lungs/Pleura: No pleural effusion. No pneumothorax. No mass or focal
consolidation. No suspicious pulmonary nodules.

CTA ABDOMEN AND PELVIS

VASCULAR

Aorta: Normal caliber aorta without aneurysm, dissection, vasculitis
or significant stenosis. Minimal scattered atherosclerotic plaque.

Celiac: Patent without evidence of aneurysm, dissection, vasculitis
or significant stenosis.

SMA: Patent without evidence of aneurysm, dissection, vasculitis or
significant stenosis.

IMA: Patent.

Renals: Both renal arteries are patent without evidence of aneurysm,
dissection, vasculitis, fibromuscular dysplasia or significant
stenosis. Single renal arteries bilaterally.

Inflow: Patent without evidence of aneurysm, dissection, vasculitis
or significant stenosis.

Veins: No obvious venous abnormality within the limitations of this
arterial phase study.

NON-VASCULAR

Hepatobiliary: The liver is normal in size without focal
abnormality. No intrahepatic or extrahepatic biliary ductal
dilation. The gallbladder appears normal.

Spleen: Normal in size without focal abnormality.

Pancreas: No pancreatic ductal dilatation or surrounding
inflammatory changes.

Adrenals/Urinary Tract: Adrenal glands are unremarkable. Kidneys are
normal, without renal calculi, focal lesion, or hydronephrosis.
Bladder is unremarkable.

Stomach/Bowel: The stomach, small bowel and large bowel are normal
in caliber without abnormal wall thickening or surrounding
inflammatory changes.

Reproductive: Prostate is unremarkable.

Lymphatic: No enlarged lymph nodes in the abdomen or pelvis.

Other: No abdominopelvic ascites.

Musculoskeletal: No aggressive osseous lesions. Right groin lipoma.

Review of the MIP images confirms the above findings.
IMPRESSION: Vascular:

1. No findings of aortic dissection or aneurysm.

Nonvascular:

1. No acute findings in the chest.
2. No acute findings in the abdomen or pelvis.

## 2023-07-19 MED ORDER — FLUTICASONE PROPIONATE 50 MCG/ACT NA SUSP: 1.0000 | Freq: Every day | NASAL | 0 refills | Status: AC

## 2023-07-19 NOTE — Discharge Instructions (Signed)
You have symptom is likely due to allergy.  Please use Flonase, spray 1 spray each nare in the morning daily for the next 30 days.

## 2023-07-19 NOTE — ED Notes (Signed)
Discharge instructions reviewed.   Opportunity for questions and concerns provided.   Alert, oriented, and ambulatory. Displays no signs of distress.   Newly prescribed medications discussed. Pharmacy verified.

## 2023-07-19 NOTE — ED Triage Notes (Signed)
Patient reports nasal congestion/sinus pressure with occasional productive cough and chest congestion this week , denies fever or chills , he adds left knee tightness / ambulatory .

## 2023-07-19 NOTE — ED Provider Notes (Signed)
[place] EMERGENC[facility] DEPARTMENT AT [facility] Provider Note   CSN: [id number] Arrival date & time: 07/19/23  1843     History  Chief Complaint  Patient presents with   Nasal Congestion/Cough    CRAMER MCNERNEY is a 62 y.o. male.  The history is provided by the patient and medical records. No language interpreter was used.     62 year old male significant history of chronic knee pain, GERD, asthma, presenting with nasal congestion.  Patient states for more than a week he has had sinus congestion, postnasal drainage, sneezing, itchy eyes, occasional nonproductive cough.  States he has had the symptoms recurrently over the year.  He denies any fever or chills no nausea vomiting diarrhea no chest pain or shortness of breath.  Denies any change in his environment no new drugs or medication.  Denies alcohol or tobacco use.  He tried using some sinus cold medication at home with some relief.  Home Medications Prior to Admission medications   Medication Sig Start Date End Date Taking? Authorizing Provider  albuterol (VENTOLIN HFA) 108 (90 Base) MCG/ACT inhaler Inhale 2 puffs into the lungs every 6 (six) hours as needed for wheezing or shortness of breath (cough). 05/18/23   Garrison, [place] N, FNP  alum & mag hydroxide-simeth (MAALOX/MYLANTA) 200-200-20 MG/5ML suspension Take 15 mLs by mouth every 6 (six) hours as needed (Stomach issure).    [provider]  atorvastatin (LIPITOR) 10 MG tablet Take 1 tablet (10 mg total) by mouth daily. 12/21/22   Storm Frisk, MD  clotrimazole (LOTRIMIN) 1 % cream Apply to affected area 2 times daily 05/18/23   Garrison, [place] N, FNP  famotidine (PEPCID) 20 MG tablet Take 1 tablet (20 mg total) by mouth daily. 12/17/22   Rising, Lurena Joiner, PA-C  metroNIDAZOLE (METROCREAM) 0.75 % cream Apply topically 2 (two) times daily. 05/18/23   Garrison, [place] N, FNP  pantoprazole (PROTONIX) 40 MG tablet Take 1 tablet (40 mg total) by mouth daily.  10/17/22   Imogene Burn, MD  sucralfate (CARAFATE) 1 g tablet Take 1 tablet (1 g total) by mouth 4 (four) times daily -  with meals and at bedtime. Patient taking differently: Take 1 g by mouth 4 (four) times daily as needed (stomach issues.). 12/24/21   Tegeler, Canary Brim, MD      Allergies    Patient has no known allergies.    Review of Systems   Review of Systems  All other systems reviewed and are negative.   Physical Exam Updated Vital Signs BP (!) 134/97 (BP Location: Right Arm)   Pulse 83   Temp 98.1 F (36.7 C)   Resp 17   SpO2 99%  Physical Exam Vitals and nursing note reviewed.  Constitutional:      General: He is not in acute distress.    Appearance: He is well-developed.  HENT:     Head: Atraumatic.     Nose: Nose normal.     Mouth/Throat:     Mouth: Mucous membranes are moist.  Eyes:     Conjunctiva/sclera: Conjunctivae normal.  Cardiovascular:     Rate and Rhythm: Normal rate and regular rhythm.     Pulses: Normal pulses.     Heart sounds: Normal heart sounds.  Pulmonary:     Effort: Pulmonary effort is normal.     Breath sounds: Normal breath sounds. No wheezing, rhonchi or rales.  Abdominal:     Palpations: Abdomen is soft.     Tenderness:  There is no abdominal tenderness.  Musculoskeletal:     Cervical back: Normal range of motion and neck supple. No rigidity or tenderness.  Skin:    Findings: No rash.  Neurological:     Mental Status: He is alert.     ED Results / Procedures / Treatments   Labs (all labs ordered are listed, but only abnormal results are displayed) Labs Reviewed - No data to display  EKG None  Radiology DG Chest 2 View  Result Date: 07/19/2023 CLINICAL DATA:  Nasal congestion, cough,, chest congestion. EXAM: CHEST - 2 VIEW COMPARISON:  05/18/2023. FINDINGS: The heart size and mediastinal contours are within normal limits. There is atherosclerotic calcification of the aorta. No consolidation, effusion, or pneumothorax.  Degenerative changes are present in the thoracic spine. No acute osseous abnormality. IMPRESSION: No active cardiopulmonary disease. Electronically Signed   By: Thornell Sartorius M.D.   On: 07/19/2023 21:27    Procedures Procedures    Medications Ordered in ED Medications - No data to display  ED Course/ Medical Decision Making/ A&P                                 Medical Decision Making  BP (!) 134/97 (BP Location: Right Arm)   Pulse 83   Temp 98.1 F (36.7 C)   Resp 17   SpO2 99%   23:1 PM 62 year old male significant history of chronic knee pain, GERD, asthma, presenting with nasal congestion.  Patient states for more than a week he has had sinus congestion, postnasal drainage, sneezing, itchy eyes, occasional nonproductive cough.  States he has had the symptoms recurrently over the year.  He denies any fever or chills no nausea vomiting diarrhea no chest pain or shortness of breath.  Denies any change in his environment no new drugs or medication.  Denies alcohol or tobacco use.  He tried using some sinus cold medication at home with some relief.  Overall exam overall reassuring patient without any acute respiratory discomfort, heart with normal rate and rhythm, lungs clear to auscultation bilaterally ear nose throat exam unremarkable no sinus tenderness to percussion.  Vital signs overall reassuring no fever no hypoxia.  Chest x-ray obtained independent viewed interpreted by me without any acute findings agree with radiology interpretation.  The patient symptoms more likely to be allergies and less likely to be a viral illness.  Will provide supportive care.  Doubt bacterial sinusitis or pharyngitis.  Return precaution given.        Final Clinical Impression(s) / ED Diagnoses Final diagnoses:  Sinus congestion    Rx / DC Orders ED Discharge Orders          Ordered    fluticasone (FLONASE) 50 MCG/ACT nasal spray  Daily        07/19/23 2215              Fayrene Helper, PA-C 07/19/23 2217    Alvira Monday, MD 07/20/23 1142

## 2023-07-28 ENCOUNTER — Emergency Department (HOSPITAL_COMMUNITY)
Admission: EM | Admit: 2023-07-28 | Discharge: 2023-07-28 | Disposition: A | Discharge status: Home / Self Care | Payer: Medicaid Other | Attending: Emergency Medicine | Admitting: Emergency Medicine

## 2023-07-28 ENCOUNTER — Encounter (HOSPITAL_COMMUNITY): Payer: Self-pay | Admitting: Emergency Medicine

## 2023-07-28 ENCOUNTER — Other Ambulatory Visit: Payer: Self-pay

## 2023-07-28 DIAGNOSIS — Z8616 Personal history of COVID-19: Secondary | ICD-10-CM | POA: Insufficient documentation

## 2023-07-28 DIAGNOSIS — Z7951 Long term (current) use of inhaled steroids: Secondary | ICD-10-CM | POA: Insufficient documentation

## 2023-07-28 DIAGNOSIS — J45909 Unspecified asthma, uncomplicated: Secondary | ICD-10-CM | POA: Insufficient documentation

## 2023-07-28 DIAGNOSIS — M79605 Pain in left leg: Secondary | ICD-10-CM | POA: Diagnosis present

## 2023-07-28 MED ORDER — ACETAMINOPHEN 500 MG PO TABS
1000.0000 mg | ORAL_TABLET | Freq: Once | ORAL | Status: AC
  Administered 2023-07-28: 1000 mg via ORAL
  Filled 2023-07-28: qty 2

## 2023-07-28 NOTE — ED Provider Notes (Signed)
[facility] AT [facility] Provider Note   CSN: [id number] Arrival date & time: 07/28/23  1801     History  Chief Complaint  Patient presents with   Leg Pain    Zachary [name] is a 62 y.o. male with past medical history as seen below presents with complaints of 2-week onset of lower extremity pain.  Denies any injury or trauma.  Describes pain in the bottom of his left foot, calf and knee.  He does endorse numbness along the dorsum of his foot.  Denies any pain proximal to his knee or back pain or prior surgeries.  Denies any other systemic symptoms including chest pain, shortness of breath or abdominal pain.    Leg Pain  Past Medical History:  Diagnosis Date   Ampullary adenoma    Asthma    COVID-19 virus infection 09/15/2020   Diverticulosis    GERD (gastroesophageal reflux disease)    Knee pain        Home Medications Prior to Admission medications   Medication Sig Start Date End Date Taking? Authorizing Provider  albuterol (VENTOLIN HFA) 108 (90 Base) MCG/ACT inhaler Inhale 2 puffs into the lungs every 6 (six) hours as needed for wheezing or shortness of breath (cough). 05/18/23   Garrison, [place] N, FNP  alum & mag hydroxide-simeth (MAALOX/MYLANTA) 200-200-20 MG/5ML suspension Take 15 mLs by mouth every 6 (six) hours as needed (Stomach issure).    [provider]  atorvastatin (LIPITOR) 10 MG tablet Take 1 tablet (10 mg total) by mouth daily. 12/21/22   Storm Frisk, MD  clotrimazole (LOTRIMIN) 1 % cream Apply to affected area 2 times daily 05/18/23   Garrison, [place] N, FNP  famotidine (PEPCID) 20 MG tablet Take 1 tablet (20 mg total) by mouth daily. 12/17/22   Rising, Lurena Joiner, PA-C  fluticasone (FLONASE) 50 MCG/ACT nasal spray Place 1 spray into both nostrils daily. 07/19/23   Fayrene Helper, PA-C  metroNIDAZOLE (METROCREAM) 0.75 % cream Apply topically 2 (two) times daily. 05/18/23   Garrison, [place] N, FNP  pantoprazole  (PROTONIX) 40 MG tablet Take 1 tablet (40 mg total) by mouth daily. 10/17/22   Imogene Burn, MD  sucralfate (CARAFATE) 1 g tablet Take 1 tablet (1 g total) by mouth 4 (four) times daily -  with meals and at bedtime. Patient taking differently: Take 1 g by mouth 4 (four) times daily as needed (stomach issues.). 12/24/21   Tegeler, Canary Brim, MD      Allergies    Patient has no known allergies.    Review of Systems   Review of Systems  Musculoskeletal:  Positive for arthralgias and myalgias.  Neurological:  Positive for numbness.    Physical Exam Updated Vital Signs BP 111/72 (BP Location: Right Arm)   Pulse 69   Temp 98.4 F (36.9 C) (Oral)   Resp 18   Ht 5\' 6"  (1.676 m)   Wt 65.8 kg   SpO2 95%   BMI 23.40 kg/m  Physical Exam Vitals and nursing note reviewed.  Constitutional:      General: He is not in acute distress.    Appearance: He is well-developed.  HENT:     Head: Normocephalic and atraumatic.  Cardiovascular:     Rate and Rhythm: Normal rate and regular rhythm.     Heart sounds: No murmur heard. Pulmonary:     Effort: Pulmonary effort is normal. No respiratory distress.     Breath sounds: Normal breath sounds.  Abdominal:     Palpations: Abdomen is soft.     Tenderness: There is no abdominal tenderness.  Musculoskeletal:        General: No swelling.     Cervical back: Neck supple.     Comments: Nonfocal exam: Generalized left knee tenderness, stable to varus and valgus.  Tolerates full range of motion.  Ambulates without difficulty.  No increased warmth appreciated.  He does have tenderness along the plantar portion of his foot.  He has good cap refill, sensation is intact, symmetric pulses.  Significant swelling or lesions appreciated.  However he does have a positive Homans  Skin:    General: Skin is warm and dry.     Capillary Refill: Capillary refill takes less than 2 seconds.  Neurological:     Mental Status: He is alert.  Psychiatric:        Mood  and Affect: Mood normal.     ED Results / Procedures / Treatments   Labs (all labs ordered are listed, but only abnormal results are displayed) Labs Reviewed - No data to display  EKG None  Radiology No results found.  Procedures Procedures    Medications Ordered in ED Medications  acetaminophen (TYLENOL) tablet 1,000 mg (1,000 mg Oral Given 07/28/23 1914)    ED Course/ Medical Decision Making/ A&P                                 Medical Decision Making  This patient presents to the ED with chief complaint(s) of LLE pain with no pertinent past medical history.  The complaint involves an extensive differential diagnosis and also carries with it a high risk of complications and morbidity.    The differential diagnosis includes  DVT, septic joint, spinal stenosis with radiculopathy, myelopathy, or skeletal including OA, meniscal pathology, plantar fasciitis  The initial plan is to  Etiology is likely musculoskeletal and patient would benefit from plain films of left knee and lumbar, however, most life-threatening etiology would be DVT.  Doppler ordered to rule this out.    Additional history obtained: No additional historians Records reviewed prior ED notes  Initial Assessment:   Patient is well-appearing with a nonfocal musculoskeletal exam.  He ambulates without assistance.  No suspicion for septic joint or myelopathy.   Independent ECG/labs interpretation:  No ECG or labs ordered  Independent visualization and interpretation of imaging: Doppler ultrasound was ordered to rule out DVT, however this was not available at our facility this evening.    Treatment and Reassessment: Tylenol, he does have some continued discomfort  Consultations obtained:   None  Disposition:   Patient will be discharged home with plan to return tomorrow for Dopplers to rule out DVT.  Patient is in agreement with this plan.  He was also provided contact for Ortho for continued  musculoskeletal workup. The patient has been appropriately medically screened and/or stabilized in the ED. I have low suspicion for any other emergent medical condition which would require further screening, evaluation or treatment in the ED or require inpatient management. At time of discharge the patient is hemodynamically stable and in no acute distress. I have discussed work-up results and diagnosis with patient and answered all questions. Patient is agreeable with discharge plan. We discussed strict return precautions for returning to the emergency department and they verbalized understanding.    Social Determinants of Health:   none  Final Clinical Impression(s) / ED Diagnoses Final diagnoses:  Left leg pain    Rx / DC Orders ED Discharge Orders          Ordered    LE VENOUS        07/28/23 2008              Fabienne Bruns 07/28/23 2031    Laurence Spates, MD 07/29/23 [phone]

## 2023-07-28 NOTE — Discharge Instructions (Addendum)
It was a pleasure taking care of you this evening.  You were evaluated in the emergency department for left lower extremity pain.  You were given Tylenol for pain.  As discussed the symptoms could represent a blood clot which is why a ultrasound was ordered of your lower extremity.  Please return tomorrow, you do not need to check-in.  Please go to the registration desk at the ER inform them that you just need a ultrasound of your left lower extremity to rule out a blood clot.  If this is negative please follow-up with the provided contact information for orthopedics for further evaluation of your symptoms.  If you experience any new or worsening symptoms including chest pain, shortness of breath, worsening leg pain and swelling please return to the emergency department.

## 2023-07-28 NOTE — ED Triage Notes (Signed)
Pt here for L leg pain for "a couple weeks", denies injury. States this happens off and on where he has pain in both legs. Ambulatory in triage.

## 2023-07-29 ENCOUNTER — Ambulatory Visit (HOSPITAL_COMMUNITY)
Admission: RE | Admit: 2023-07-29 | Discharge: 2023-07-29 | Disposition: A | Discharge status: Home / Self Care | Payer: Medicaid Other | Source: Ambulatory Visit | Attending: Emergency Medicine | Admitting: Emergency Medicine

## 2023-07-29 DIAGNOSIS — M79662 Pain in left lower leg: Secondary | ICD-10-CM | POA: Diagnosis not present

## 2023-07-29 DIAGNOSIS — M79673 Pain in unspecified foot: Secondary | ICD-10-CM | POA: Insufficient documentation

## 2023-07-29 NOTE — Progress Notes (Signed)
VASCULAR LAB    Left lower extremity venous duplex has been performed.  See CV proc for preliminary results.   Brittannie Tawney, RVT 07/29/2023, 11:27 AM

## 2023-11-03 ENCOUNTER — Encounter (HOSPITAL_COMMUNITY): Payer: Self-pay | Admitting: Emergency Medicine

## 2023-11-03 ENCOUNTER — Ambulatory Visit (HOSPITAL_COMMUNITY)
Admission: EM | Admit: 2023-11-03 | Discharge: 2023-11-03 | Disposition: A | Discharge status: Home / Self Care | Payer: 59 | Attending: Internal Medicine | Admitting: Internal Medicine

## 2023-11-03 DIAGNOSIS — J0100 Acute maxillary sinusitis, unspecified: Secondary | ICD-10-CM

## 2023-11-03 MED ORDER — AZITHROMYCIN 250 MG PO TABS: ORAL_TABLET | ORAL | 0 refills | Status: DC

## 2023-11-03 MED ORDER — PREDNISONE 20 MG PO TABS: 40.0000 mg | ORAL_TABLET | Freq: Every day | ORAL | 0 refills | Status: AC

## 2023-11-03 MED ORDER — PROMETHAZINE-DM 6.25-15 MG/5ML PO SYRP: 5.0000 mL | ORAL_SOLUTION | Freq: Three times a day (TID) | ORAL | 0 refills | Status: DC | PRN

## 2023-11-03 NOTE — Discharge Instructions (Addendum)
 Symptoms are most consistent with a maxillary sinus infection.  Abdominal pain is likely secondary from excessive coughing causing muscle spasm.  We will treat this with the following: Azithromycin 250mg  Take 2 tablets today and the 1 tablet daily for 4 more days. Prednisone 40 mg (2 tablets) once daily for 5 days. Take this in the morning.  This is a steroid to help with inflammation and pain. Promethazine DM 5 mL every 8 hours as needed for cough.  Use caution as this medication can cause drowsiness. Rest and stay hydrated.  Return to urgent care or PCP if symptoms worsen or fail to resolve.

## 2023-11-03 NOTE — ED Triage Notes (Signed)
 Patient presents with c/o sinus pressure x 2 weeks and abdominal discomfort x 5 days. Patient states he has been taking mucinex for the symptoms.

## 2023-11-03 NOTE — ED Provider Notes (Signed)
 [facility]    CSN: [id number] Arrival date & time: 11/03/23  1724      History   Chief Complaint Chief Complaint  Patient presents with   Facial Pain   Abdominal Pain    HPI Zachary [name] is a 63 y.o. male.   63 year old male who presents urgent care with complaints of congestion, cough, sinus pain, sinus pressure and mild abdominal pain.  The patient reports that he has on and off sinus infections.  The current symptoms have been going on approximately 2 weeks and seem to be getting worse.  He has also been coughing a lot causing him to have some abdominal pain from muscle strain.  He has had some fevers at times but does not have any currently and has not had in several days.  He denies nausea, vomiting, sore throat, ear pain, poor appetite, dysuria, hematuria.   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   Anxiety and depression 12/27/2021   Allergic rhinitis 11/15/2020   Inguinal hernia of right side without obstruction or gangrene 03/17/2020   Periodontal disease 03/17/2020   Dental caries 03/17/2020   Mild intermittent asthma without complication 03/17/2020   Gastroesophageal reflux disease without esophagitis 03/17/2020   Chronic pain of right knee 03/17/2020    Past Surgical History:  Procedure Laterality Date   BIOPSY  06/07/2022   Procedure: BIOPSY;  Surgeon: Lemar Lofty., MD;  Location: [facility];  Service: Gastroenterology;;    ENDOSCOPIC RETROGRADE CHOLANGIOPANCREATOGRAPHY (ERCP) WITH PROPOFOL N/A 06/27/2022   Procedure: ENDOSCOPIC RETROGRADE CHOLANGIOPANCREATOGRAPHY (ERCP) WITH PROPOFOL;  Surgeon: Lemar Lofty., MD;  Location: [facility];  Service: Gastroenterology;  Laterality: N/A;   ESOPHAGOGASTRODUODENOSCOPY (EGD) WITH PROPOFOL N/A 06/07/2022   Procedure: ESOPHAGOGASTRODUODENOSCOPY (EGD) WITH PROPOFOL;  Surgeon: Meridee Score Netty Starring., MD;  Location: [facility];  Service: Gastroenterology;  Laterality: N/A;   ESOPHAGOGASTRODUODENOSCOPY ENDOSCOPY  10/11/2021   EUS N/A 06/07/2022   Procedure: UPPER ENDOSCOPIC ULTRASOUND (EUS) RADIAL;  Surgeon: Lemar Lofty., MD;  Location: [facility];  Service: Gastroenterology;  Laterality: N/A;   KNEE ARTHROSCOPY     x 2   RADIOLOGY WITH ANESTHESIA N/A 03/28/2022   Procedure: MRI ABDOMEN MRCP WITH AND WITHOUT CONTRAST WITH ANESTHESIA;  Surgeon: Radiologist, Medication, MD;  Location: [facility];  Service: Radiology;  Laterality: N/A;   UPPER GASTROINTESTINAL ENDOSCOPY  12/29/2021   [name]       Home Medications    Prior to Admission medications   Medication Sig Start Date End Date Taking? Authorizing Provider  azithromycin (ZITHROMAX) 250 MG tablet Take first 2 tablets together, then 1 every day until finished. 11/03/23  Yes Aldrin Engelhard A, PA-C  predniSONE (DELTASONE) 20 MG tablet Take 2 tablets (40 mg total) by mouth daily with breakfast for 5 days. 11/03/23 11/08/23 Yes Sanyiah Kanzler A, PA-C  promethazine-dextromethorphan (PROMETHAZINE-DM) 6.25-15 MG/5ML syrup Take 5 mLs by mouth every 8 (eight) hours as needed for cough. 11/03/23  Yes Doristine Mango A, PA-C  fluticasone (FLONASE) 50 MCG/ACT nasal spray Place 1 spray into both nostrils daily. 07/19/23   Fayrene Helper, PA-C    Family History Family History  Problem Relation Age of Onset   Hypertension Mother    Cancer Father    Colon cancer Maternal Uncle    Colon cancer Cousin     Cancer Other    Esophageal cancer Neg Hx    Inflammatory bowel disease Neg Hx    Liver disease Neg Hx    Pancreatic cancer Neg Hx    Rectal cancer Neg Hx    Stomach cancer Neg Hx     Social History Social History   Tobacco Use   Smoking status: Former   Smokeless tobacco: Never  Vaping Use   Vaping status: Never Used  Substance Use Topics   Alcohol use: No   Drug use: No     Allergies   Patient has no known allergies.   Review of Systems Review of Systems  Constitutional:  Negative for activity change, appetite change, chills and fever.  HENT:  Positive for congestion, sinus pressure and sinus pain. Negative for ear pain and sore throat.   Eyes:  Negative for pain and visual disturbance.  Respiratory:  Negative for cough and shortness of breath.   Cardiovascular:  Negative for chest pain and palpitations.  Gastrointestinal:  Positive for abdominal pain. Negative for abdominal distention, constipation, diarrhea, nausea and vomiting.  Genitourinary:  Negative for dysuria and hematuria.  Musculoskeletal:  Negative for arthralgias and back pain.  Skin:  Negative for color change and rash.  Neurological:  Negative for seizures and syncope.  All other systems reviewed and are negative.    Physical Exam Triage Vital Signs ED Triage Vitals  Encounter Vitals Group     BP 11/03/23 1844 137/86     Systolic BP Percentile --      Diastolic BP Percentile --      Pulse Rate 11/03/23 1844 73     Resp 11/03/23 1844 18     Temp 11/03/23 1844 97.7 F (36.5 C)     Temp Source 11/03/23 1844 Oral     SpO2 11/03/23 1844 95 %     Weight --      Height --      Head Circumference --      Peak Flow --      Pain Score 11/03/23 1847 8     Pain Loc --      Pain Education --      Exclude from Growth Chart --    No data found.  Updated Vital Signs BP 137/86 (BP Location: Right Arm)   Pulse 73   Temp 97.7 F (36.5 C) (Oral)   Resp 18   SpO2 95%   Visual Acuity Right Eye  Distance:   Left Eye Distance:   Bilateral Distance:    Right Eye Near:   Left Eye Near:    Bilateral Near:     Physical Exam Vitals and nursing note reviewed.  Constitutional:      General: He is not in acute distress.    Appearance: He is well-developed.  HENT:     Head: Normocephalic and atraumatic.     Nose: Nasal tenderness, congestion and rhinorrhea present.     Right Turbinates: Swollen.     Left Turbinates: Swollen.     Right Sinus: Maxillary sinus tenderness present.     Left Sinus: Maxillary sinus tenderness present.  Eyes:     Conjunctiva/sclera: Conjunctivae normal.  Cardiovascular:     Rate and Rhythm: Normal rate and regular rhythm.     Heart sounds: No murmur heard. Pulmonary:     Effort: Pulmonary effort is normal. No respiratory distress.     Breath sounds: Normal breath sounds.  Abdominal:     Palpations: Abdomen is soft.     Tenderness: There is no abdominal tenderness. There is no guarding or rebound. Negative signs include Murphy's sign.     Hernia: No hernia is present.  Musculoskeletal:        General: No swelling.     Cervical back: Neck supple.  Skin:    General: Skin is warm and dry.     Capillary Refill: Capillary refill takes less than 2 seconds.  Neurological:     Mental Status: He is alert.  Psychiatric:        Mood and Affect: Mood normal.      UC Treatments / Results  Labs (all labs ordered are listed, but only abnormal results are displayed) Labs Reviewed - No data to display  EKG   Radiology No results found.  Procedures Procedures (including critical care time)  Medications Ordered in UC Medications - No data to display  Initial Impression / Assessment and Plan / UC Course  I have reviewed the triage vital signs and the nursing notes.  Pertinent labs & imaging results that were available during my care of the patient were reviewed by me and considered in my medical decision making (see chart for details).      Acute non-recurrent maxillary sinusitis   Symptoms are most consistent with a maxillary sinus infection.  Abdominal pain is likely secondary from excessive coughing causing muscle spasm.  We will treat this with the following: Azithromycin 250mg  Take 2 tablets today and the 1 tablet daily for 4 more days. Prednisone 40 mg (2 tablets) once daily for 5 days. Take this in the morning.  This is a steroid to help with inflammation and pain. Promethazine DM 5 mL every 8 hours as needed for cough.  Use caution as this medication can cause drowsiness. Rest and stay hydrated.  Return to urgent care or PCP if symptoms worsen or fail to resolve.    Final Clinical Impressions(s) / UC Diagnoses   Final diagnoses:  Acute non-recurrent maxillary sinusitis     Discharge Instructions      Symptoms are most consistent with a maxillary sinus infection.  Abdominal pain is likely secondary from excessive coughing causing muscle spasm.  We will treat this with the following: Azithromycin 250mg  Take 2 tablets today and the 1 tablet daily for 4 more days. Prednisone 40 mg (2 tablets) once daily for 5 days. Take this in the morning.  This is a steroid to help with inflammation and pain. Promethazine DM 5 mL every 8 hours as needed for cough.  Use caution as this medication can cause drowsiness. Rest and stay hydrated.  Return to urgent care or PCP if symptoms worsen or fail to resolve.     ED Prescriptions     Medication Sig Dispense Auth. Provider   azithromycin (ZITHROMAX) 250 MG tablet Take first 2 tablets together, then 1 every day until finished. 6 tablet Landis Martins, PA-C   promethazine-dextromethorphan (PROMETHAZINE-DM) 6.25-15 MG/5ML syrup Take 5 mLs by mouth every 8 (eight) hours as needed for cough. 180 mL Juelz Whittenberg A, PA-C   predniSONE (DELTASONE) 20 MG tablet Take 2 tablets (  40 mg total) by mouth daily with breakfast for 5 days. 10 tablet Landis Martins, [place]      PDMP  not reviewed this encounter.   Landis Martins, [place] 11/03/23 1918

## 2023-11-24 ENCOUNTER — Emergency Department (HOSPITAL_COMMUNITY)

## 2023-11-24 ENCOUNTER — Emergency Department (HOSPITAL_COMMUNITY)
Admission: EM | Admit: 2023-11-24 | Discharge: 2023-11-25 | Disposition: A | Discharge status: Home / Self Care | Attending: Emergency Medicine | Admitting: Emergency Medicine

## 2023-11-24 DIAGNOSIS — M25562 Pain in left knee: Secondary | ICD-10-CM | POA: Insufficient documentation

## 2023-11-24 DIAGNOSIS — M25561 Pain in right knee: Secondary | ICD-10-CM | POA: Insufficient documentation

## 2023-11-24 DIAGNOSIS — M1990 Unspecified osteoarthritis, unspecified site: Secondary | ICD-10-CM | POA: Insufficient documentation

## 2023-11-24 DIAGNOSIS — G8929 Other chronic pain: Secondary | ICD-10-CM | POA: Insufficient documentation

## 2023-11-24 NOTE — ED Triage Notes (Signed)
 Pt states that he has been having pain in both of his knees for a while, states that he had xrays a while ago and that the bones are rubbing together.

## 2023-11-25 MED ORDER — LIDOCAINE 5 % EX PTCH: 1.0000 | MEDICATED_PATCH | CUTANEOUS | 0 refills | Status: DC

## 2023-11-25 MED ORDER — ACETAMINOPHEN 500 MG PO TABS
1000.0000 mg | ORAL_TABLET | Freq: Once | ORAL | Status: AC
  Administered 2023-11-25: 1000 mg via ORAL
  Filled 2023-11-25: qty 2

## 2023-11-25 MED ORDER — NAPROXEN 250 MG PO TABS
500.0000 mg | ORAL_TABLET | Freq: Once | ORAL | Status: AC
  Administered 2023-11-25: 500 mg via ORAL
  Filled 2023-11-25: qty 2

## 2023-11-25 MED ORDER — NAPROXEN 500 MG PO TABS: 500.0000 mg | ORAL_TABLET | Freq: Two times a day (BID) | ORAL | 0 refills | Status: DC

## 2023-11-25 MED ORDER — LIDOCAINE 5 % EX PTCH
2.0000 | MEDICATED_PATCH | CUTANEOUS | Status: DC
  Administered 2023-11-25: 2 via TRANSDERMAL
  Filled 2023-11-25: qty 2

## 2023-11-25 NOTE — ED Provider Notes (Signed)
 Zachary [name] AT [facility] Provider Note   CSN: [id number] Arrival date & time: 11/24/23  2125     History  Chief Complaint  Patient presents with   Knee Pain    Zachary [name] is a 63 y.o. male.  The history is provided by the patient.  Knee Pain Location:  Knee Injury: no   Knee location:  L knee and R knee Pain details:    Quality:  Aching   Radiates to:  Does not radiate   Severity:  Moderate   Onset quality:  Gradual   Timing:  Constant   Progression:  Unchanged Chronicity:  Chronic Relieved by:  Nothing Worsened by:  Nothing Ineffective treatments:  None tried Associated symptoms: no back pain and no fever   Risk factors: no concern for non-accidental trauma        Home Medications Prior to Admission medications   Medication Sig Start Date End Date Taking? Authorizing Provider  lidocaine (LIDODERM) 5 % Place 1 patch onto the skin daily. Remove & Discard patch within 12 hours or as directed by MD 11/25/23  Yes Riaan Toledo, MD  naproxen (NAPROSYN) 500 MG tablet Take 1 tablet (500 mg total) by mouth 2 (two) times daily. 11/25/23  Yes Ismerai Bin, MD  azithromycin (ZITHROMAX) 250 MG tablet Take first 2 tablets together, then 1 every day until finished. 11/03/23   White, Elizabeth A, PA-C  fluticasone (FLONASE) 50 MCG/ACT nasal spray Place 1 spray into both nostrils daily. 07/19/23   Fayrene Helper, PA-C  promethazine-dextromethorphan (PROMETHAZINE-DM) 6.25-15 MG/5ML syrup Take 5 mLs by mouth every 8 (eight) hours as needed for cough. 11/03/23   Landis Martins, PA-C      Allergies    Patient has no known allergies.    Review of Systems   Review of Systems  Constitutional:  Negative for fever.  Musculoskeletal:  Positive for arthralgias. Negative for back pain.  All other systems reviewed and are negative.   Physical Exam Updated Vital Signs BP 118/75 (BP Location: Right Arm)   Pulse 66   Temp 97.8 F (36.6 C)   Resp  17   SpO2 95%  Physical Exam Vitals and nursing note reviewed.  Constitutional:      General: He is not in acute distress.    Appearance: Normal appearance. He is well-developed. He is not diaphoretic.  HENT:     Head: Normocephalic and atraumatic.     Nose: Nose normal.  Eyes:     Conjunctiva/sclera: Conjunctivae normal.     Pupils: Pupils are equal, round, and reactive to light.  Cardiovascular:     Rate and Rhythm: Normal rate and regular rhythm.  Pulmonary:     Effort: Pulmonary effort is normal.     Breath sounds: Normal breath sounds. No wheezing or rales.  Abdominal:     General: Bowel sounds are normal.     Palpations: Abdomen is soft.     Tenderness: There is no abdominal tenderness. There is no guarding or rebound.  Musculoskeletal:        General: Normal range of motion.     Cervical back: Normal range of motion and neck supple.  Skin:    General: Skin is warm and dry.     Capillary Refill: Capillary refill takes less than 2 seconds.  Neurological:     General: No focal deficit present.     Mental Status: He is alert and oriented to person, place, and time.  Deep Tendon Reflexes: Reflexes normal.  Psychiatric:        Mood and Affect: Mood normal.        Behavior: Behavior normal.     ED Results / Procedures / Treatments   Labs (all labs ordered are listed, but only abnormal results are displayed) Labs Reviewed - No data to display  EKG None  Radiology DG Knee Complete 4 Views Right Result Date: 11/24/2023 CLINICAL DATA:  Bilateral knee pain.  No injury. EXAM: RIGHT KNEE - COMPLETE 4+ VIEW COMPARISON:  01/07/2021 FINDINGS: Moderate to advanced tricompartment degenerative changes with joint space narrowing and spurring. Small joint effusion. No acute bony abnormality. Specifically, no fracture, subluxation, or dislocation. IMPRESSION: Moderate to advanced tricompartment degenerative changes with small joint effusion. No acute bony abnormality.  Electronically Signed   By: Charlett Nose M.D.   On: 11/24/2023 22:16   DG Knee Complete 4 Views Left Result Date: 11/24/2023 CLINICAL DATA:  Bilateral knee pain EXAM: LEFT KNEE - COMPLETE 4+ VIEW COMPARISON:  None Available. FINDINGS: Slight joint space narrowing in the medial compartment. No acute bony abnormality. Specifically, no fracture, subluxation, or dislocation. No joint effusion IMPRESSION: No acute bony abnormality. Electronically Signed   By: Charlett Nose M.D.   On: 11/24/2023 22:15    Procedures Procedures    Medications Ordered in ED Medications  lidocaine (LIDODERM) 5 % 2 patch (2 patches Transdermal Patch Applied 11/25/23 0232)  naproxen (NAPROSYN) tablet 500 mg (500 mg Oral Given 11/25/23 0232)  acetaminophen (TYLENOL) tablet 1,000 mg (1,000 mg Oral Given 11/25/23 1096)    ED Course/ Medical Decision Making/ A&P                                 Medical Decision Making Patient with ongoing knee pain   Amount and/or Complexity of Data Reviewed External Data Reviewed: notes.    Details: Previous notes reviewed  Radiology: ordered and independent interpretation performed.    Details: Arthritis   Risk OTC drugs. Prescription drug management. Risk Details: B knee pain that is ongoing for some time.  Medication and follow up with orthopedics     Final Clinical Impression(s) / ED Diagnoses Final diagnoses:  Chronic pain of both knees  Arthritis    No signs of systemic illness or infection. The patient is nontoxic-appearing on exam and vital signs are within normal limits.  I have reviewed the triage vital signs and the nursing notes. Pertinent labs & imaging results that were available during my care of the patient were reviewed by me and considered in my medical decision making (see chart for details). After history, exam, and medical workup I feel the patient has been appropriately medically screened and is safe for discharge home. Pertinent diagnoses were discussed  with the patient. Patient was given return precautions.  Rx / DC Orders ED Discharge Orders          Ordered    naproxen (NAPROSYN) 500 MG tablet  2 times daily        11/25/23 0236    lidocaine (LIDODERM) 5 %  Every 24 hours        11/25/23 0236              Rajveer Handler, MD 11/25/23 0454

## 2023-12-12 ENCOUNTER — Ambulatory Visit (INDEPENDENT_AMBULATORY_CARE_PROVIDER_SITE_OTHER): Admitting: Orthopedic Surgery

## 2023-12-12 DIAGNOSIS — G8929 Other chronic pain: Secondary | ICD-10-CM

## 2023-12-12 DIAGNOSIS — M25562 Pain in left knee: Secondary | ICD-10-CM

## 2023-12-12 DIAGNOSIS — M25561 Pain in right knee: Secondary | ICD-10-CM | POA: Diagnosis not present

## 2023-12-12 NOTE — Progress Notes (Signed)
 Orthopedic Surgery Progress Note   Assessment: Patient is a 63 y.o. male with bilateral knee pain and bilateral OA   Plan: -Patient has tried Tylenol, ibuprofen, intra-articular injections -Since he did not get any relief with the intra-articular injections, did not recommend repeating them -He has not tried PT, referral provided to him today for that -I told him if physical therapy does not give him any relief then knee replacement may be an option particular for that right side -I would like him to follow-up with one of my arthroplasty partners to see if he be a candidate for knee replacement  ___________________________________________________________________________  Subjective: Patient has had bilateral knee pain for several years that has gotten progressively worse with time.  He said he has undergone arthroscopic surgery on both knees.  He feels pain mostly on the medial aspect of the knee.  He notices it with weightbearing.  He also feels locking, clicking, and crunching in the knees.  He said he got an injection at another practice about 2 months ago and did not notice any significant relief with the injection.  There was no trauma or injury that preceded the onset of his pain.   Physical Exam:  General: no acute distress, appears stated age Neurologic: alert, answering questions appropriately, following commands Respiratory: unlabored breathing on room air, symmetric chest rise Psychiatric: appropriate affect, normal cadence to speech  MSK:    -Right knee  ROM from 0-120  Knee stable to varus and valgus stress, negative Lachman, negative posterior drawer EHL/TA/GSC intact Plantarflexes and dorsiflexes toes Sensation intact to light touch in sural, saphenous, tibial, deep peroneal, and superficial peroneal nerve distributions Foot warm and well perfused  -Left knee  ROM from 0-120  Knee stable to varus and valgus stress, negative Lachman, negative posterior  drawer EHL/TA/GSC intact Plantarflexes and dorsiflexes toes Sensation intact to light touch in sural, saphenous, tibial, deep peroneal, and superficial peroneal nerve distributions Foot warm and well perfused  XRs of the right knee from 11/24/2023 was independently reviewed and interpreted, showing joint space narrowing with osteophyte formation in the medial compartment.  Patellofemoral joint space narrowing with osteophyte formation as well.  No fracture or dislocation seen.  XRs of the right knee from 11/24/2023 was independently reviewed and interpreted, showing medial joint space narrowing with small osteophyte formation. No other significant degenerative changes within the knees.    Patient name: Zachary [name] Patient MRN: [id number] Date: 12/12/23

## 2023-12-23 ENCOUNTER — Encounter: Payer: Self-pay | Admitting: *Deleted

## 2023-12-23 ENCOUNTER — Ambulatory Visit
Admission: EM | Admit: 2023-12-23 | Discharge: 2023-12-23 | Disposition: A | Discharge status: Home / Self Care | Attending: Physician Assistant | Admitting: Physician Assistant

## 2023-12-23 DIAGNOSIS — Z113 Encounter for screening for infections with a predominantly sexual mode of transmission: Secondary | ICD-10-CM | POA: Insufficient documentation

## 2023-12-23 DIAGNOSIS — R369 Urethral discharge, unspecified: Secondary | ICD-10-CM | POA: Diagnosis present

## 2023-12-23 NOTE — ED Triage Notes (Signed)
 Patient states 2 days of penile discharge that is clear, no dysuria, no concern for STI that he knows of.

## 2023-12-23 NOTE — ED Provider Notes (Signed)
 [facility]    CSN: [id number] Arrival date & time: 12/23/23  4098      History   Chief Complaint Chief Complaint  Patient presents with   Penile Discharge    HPI Zachary [name] is a 63 y.o. male.   Patient presents today for evaluation of penile discharge that started 2 days ago.  He reports discharge is clear and he has not had any dysuria.  He denies any known exposures to STDs.  He has not had any back pain.  He reports some mild abdominal discomfort but this predated penile discharge.  The history is provided by the patient.  Penile Discharge Pertinent negatives include no shortness of breath.    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   Anxiety and depression 12/27/2021   Allergic rhinitis 11/15/2020   Inguinal hernia of right side without obstruction or gangrene 03/17/2020   Periodontal disease 03/17/2020   Dental caries 03/17/2020   Mild intermittent asthma without complication 03/17/2020   Gastroesophageal reflux disease without esophagitis 03/17/2020   Chronic pain of right knee 03/17/2020    Past Surgical History:  Procedure Laterality Date   BIOPSY  06/07/2022   Procedure: BIOPSY;  Surgeon: Normie Becton., MD;  Location: [facility];  Service: Gastroenterology;;   ENDOSCOPIC RETROGRADE CHOLANGIOPANCREATOGRAPHY (ERCP) WITH PROPOFOL N/A 06/27/2022   Procedure: ENDOSCOPIC RETROGRADE CHOLANGIOPANCREATOGRAPHY (ERCP) WITH PROPOFOL;  Surgeon: Normie Becton., MD;  Location: [facility];  Service: Gastroenterology;  Laterality: N/A;   ESOPHAGOGASTRODUODENOSCOPY (EGD) WITH PROPOFOL N/A 06/07/2022   Procedure: ESOPHAGOGASTRODUODENOSCOPY  (EGD) WITH PROPOFOL;  Surgeon: Brice Campi Albino Alu., MD;  Location: [facility];  Service: Gastroenterology;  Laterality: N/A;   ESOPHAGOGASTRODUODENOSCOPY ENDOSCOPY  10/11/2021   EUS N/A 06/07/2022   Procedure: UPPER ENDOSCOPIC ULTRASOUND (EUS) RADIAL;  Surgeon: Normie Becton., MD;  Location: [facility];  Service: Gastroenterology;  Laterality: N/A;   KNEE ARTHROSCOPY     x 2   RADIOLOGY WITH ANESTHESIA N/A 03/28/2022   Procedure: MRI ABDOMEN MRCP WITH AND WITHOUT CONTRAST WITH ANESTHESIA;  Surgeon: Radiologist, Medication, MD;  Location: [facility];  Service: Radiology;  Laterality: N/A;   UPPER GASTROINTESTINAL ENDOSCOPY  12/29/2021   Dr.Ying Rosaline Coma       Home Medications    Prior to Admission medications   Medication Sig Start Date End Date Taking? Authorizing Provider  albuterol (VENTOLIN HFA) 108 (90 Base) MCG/ACT inhaler Inhale into the lungs.    [provider]  azithromycin (ZITHROMAX) 250 MG tablet Take first 2 tablets together, then 1 every day until finished. 11/03/23   White, Elizabeth A, PA-C  fluticasone (FLONASE) 50 MCG/ACT nasal spray Place 1 spray into both nostrils daily. 07/19/23   Debbra Fairy, PA-C  lidocaine (LIDODERM) 5 % Place 1 patch onto the skin daily. Remove & Discard patch within 12 hours or as directed by MD 11/25/23   Maralee Senate, April, MD  naproxen (NAPROSYN) 500 MG tablet Take 1 tablet (500 mg total) by mouth 2 (two) times daily. 11/25/23   Palumbo, April, MD  promethazine-dextromethorphan (PROMETHAZINE-DM) 6.25-15 MG/5ML syrup Take 5 mLs by mouth every 8 (eight) hours as needed for cough. 11/03/23   White,  Marjory Signs, PA-C    Family History Family History  Problem Relation Age of Onset   Hypertension Mother    Cancer Father    Colon cancer Maternal Uncle    Colon cancer Cousin    Cancer Other    Esophageal cancer Neg Hx    Inflammatory bowel disease Neg Hx    Liver disease Neg Hx    Pancreatic cancer Neg Hx    Rectal cancer Neg Hx     Stomach cancer Neg Hx     Social History Social History   Tobacco Use   Smoking status: Former   Smokeless tobacco: Never  Vaping Use   Vaping status: Never Used  Substance Use Topics   Alcohol use: No   Drug use: No     Allergies   Patient has no known allergies.   Review of Systems Review of Systems  Constitutional:  Negative for chills and fever.  Eyes:  Negative for discharge and redness.  Respiratory:  Negative for shortness of breath.   Genitourinary:  Positive for penile discharge. Negative for dysuria.  Neurological:  Negative for numbness.     Physical Exam Triage Vital Signs ED Triage Vitals [12/23/23 0849]  Encounter Vitals Group     BP      Systolic BP Percentile      Diastolic BP Percentile      Pulse      Resp      Temp      Temp src      SpO2      Weight 156 lb (70.8 kg)     Height 5\' 6"  (1.676 m)     Head Circumference      Peak Flow      Pain Score 0     Pain Loc      Pain Education      Exclude from Growth Chart    No data found.  Updated Vital Signs BP (!) 143/84 (BP Location: Left Arm) Comment: checked twice  Pulse 63   Temp 98.1 F (36.7 C) (Oral)   Resp 18   Ht 5\' 6"  (1.676 m)   Wt 156 lb (70.8 kg)   SpO2 96%   BMI 25.18 kg/m   Visual Acuity Right Eye Distance:   Left Eye Distance:   Bilateral Distance:    Right Eye Near:   Left Eye Near:    Bilateral Near:     Physical Exam Vitals and nursing note reviewed.  Constitutional:      General: He is not in acute distress.    Appearance: Normal appearance. He is not ill-appearing.  HENT:     Head: Normocephalic and atraumatic.  Eyes:     Conjunctiva/sclera: Conjunctivae normal.  Cardiovascular:     Rate and Rhythm: Normal rate.  Pulmonary:     Effort: Pulmonary effort is normal. No respiratory distress.  Genitourinary:    Comments: Chaperone: Nuala Bel, no rash appreciated to penile shaft Neurological:     Mental Status: He is alert.  Psychiatric:         Mood and Affect: Mood normal.        Behavior: Behavior normal.        Thought Content: Thought content normal.      UC Treatments / Results  Labs (all labs ordered are listed, but only abnormal results are displayed) Labs Reviewed  CYTOLOGY, (ORAL, ANAL, URETHRAL) ANCILLARY ONLY    EKG   Radiology No results found.  Procedures Procedures (  including critical care time)  Medications Ordered in UC Medications - No data to display  Initial Impression / Assessment and Plan / UC Course  I have reviewed the triage vital signs and the nursing notes.  Pertinent labs & imaging results that were available during my care of the patient were reviewed by me and considered in my medical decision making (see chart for details).    Will order STD screening.  Advised to abstain from sexual intercourse while awaiting results.  Will await results for further recommendation.  Final Clinical Impressions(s) / UC Diagnoses   Final diagnoses:  Penile discharge   Discharge Instructions   None    ED Prescriptions   None    PDMP not reviewed this encounter.   Vernestine Gondola, PA-C 12/23/23 [phone]

## 2023-12-26 LAB — CYTOLOGY, (ORAL, ANAL, URETHRAL) ANCILLARY ONLY
Chlamydia: NEGATIVE
Comment: NEGATIVE
Comment: NEGATIVE
Comment: NORMAL
Neisseria Gonorrhea: NEGATIVE
Trichomonas: NEGATIVE

## 2024-01-07 ENCOUNTER — Emergency Department (HOSPITAL_COMMUNITY)

## 2024-01-07 ENCOUNTER — Other Ambulatory Visit: Payer: Self-pay

## 2024-01-07 ENCOUNTER — Encounter (HOSPITAL_COMMUNITY): Payer: Self-pay

## 2024-01-07 ENCOUNTER — Emergency Department (HOSPITAL_COMMUNITY)
Admission: EM | Admit: 2024-01-07 | Discharge: 2024-01-07 | Disposition: A | Discharge status: Home / Self Care | Attending: Emergency Medicine | Admitting: Emergency Medicine

## 2024-01-07 DIAGNOSIS — J218 Acute bronchiolitis due to other specified organisms: Secondary | ICD-10-CM | POA: Insufficient documentation

## 2024-01-07 DIAGNOSIS — Z8616 Personal history of COVID-19: Secondary | ICD-10-CM | POA: Diagnosis not present

## 2024-01-07 DIAGNOSIS — Z7951 Long term (current) use of inhaled steroids: Secondary | ICD-10-CM | POA: Insufficient documentation

## 2024-01-07 DIAGNOSIS — Z7952 Long term (current) use of systemic steroids: Secondary | ICD-10-CM | POA: Diagnosis not present

## 2024-01-07 DIAGNOSIS — J45909 Unspecified asthma, uncomplicated: Secondary | ICD-10-CM | POA: Diagnosis not present

## 2024-01-07 DIAGNOSIS — R059 Cough, unspecified: Secondary | ICD-10-CM | POA: Diagnosis present

## 2024-01-07 DIAGNOSIS — B349 Viral infection, unspecified: Secondary | ICD-10-CM

## 2024-01-07 LAB — RESP PANEL BY RT-PCR (RSV, FLU A&B, COVID)  RVPGX2
Influenza A by PCR: NEGATIVE
Influenza B by PCR: NEGATIVE
Resp Syncytial Virus by PCR: NEGATIVE
SARS Coronavirus 2 by RT PCR: NEGATIVE

## 2024-01-07 LAB — CBC
HCT: 39.4 % (ref 39.0–52.0)
Hemoglobin: 13.3 g/dL (ref 13.0–17.0)
MCH: 30 pg (ref 26.0–34.0)
MCHC: 33.8 g/dL (ref 30.0–36.0)
MCV: 88.9 fL (ref 80.0–100.0)
Platelets: 249 10*3/uL (ref 150–400)
RBC: 4.43 MIL/uL (ref 4.22–5.81)
RDW: 12.7 % (ref 11.5–15.5)
WBC: 4.3 10*3/uL (ref 4.0–10.5)
nRBC: 0 % (ref 0.0–0.2)

## 2024-01-07 LAB — BASIC METABOLIC PANEL WITH GFR
Anion gap: 11 (ref 5–15)
BUN: 9 mg/dL (ref 8–23)
CO2: 22 mmol/L (ref 22–32)
Calcium: 8.8 mg/dL — ABNORMAL LOW (ref 8.9–10.3)
Chloride: 105 mmol/L (ref 98–111)
Creatinine, Ser: 0.9 mg/dL (ref 0.61–1.24)
GFR, Estimated: >60 mL/min (ref >60)
Glucose, Bld: 115 mg/dL — ABNORMAL HIGH (ref 70–99)
Potassium: 3.5 mmol/L (ref 3.5–5.1)
Sodium: 138 mmol/L (ref 135–145)

## 2024-01-07 LAB — TROPONIN I (HIGH SENSITIVITY): Troponin I (High Sensitivity): 4 ng/L (ref <18)

## 2024-01-07 MED ORDER — PREDNISONE 50 MG PO TABS: ORAL_TABLET | ORAL | 0 refills | Status: AC

## 2024-01-07 MED ORDER — IPRATROPIUM-ALBUTEROL 0.5-2.5 (3) MG/3ML IN SOLN
3.0000 mL | Freq: Once | RESPIRATORY_TRACT | Status: AC
  Administered 2024-01-07: 3 mL via RESPIRATORY_TRACT
  Filled 2024-01-07: qty 3

## 2024-01-07 MED ORDER — ACETAMINOPHEN 325 MG PO TABS
650.0000 mg | ORAL_TABLET | Freq: Once | ORAL | Status: AC
  Administered 2024-01-07: 650 mg via ORAL
  Filled 2024-01-07: qty 2

## 2024-01-07 MED ORDER — DEXAMETHASONE 4 MG PO TABS
10.0000 mg | ORAL_TABLET | Freq: Once | ORAL | Status: AC
  Administered 2024-01-07: 10 mg via ORAL
  Filled 2024-01-07: qty 3

## 2024-01-07 NOTE — ED Notes (Signed)
 Patient transported to X-ray

## 2024-01-07 NOTE — ED Provider Notes (Signed)
 [place] EMERGENCY DEPARTMENT AT [facility] Provider Note   CSN: [id number] Arrival date & time: 01/07/24  0406     History  Chief Complaint  Patient presents with   Nasal Congestion   Cough   Chest Pain    Zachary [name] is a 62 y.o. male.  The history is provided by the patient.  Patient with history of asthma presents with cough.  He reports over the past week has had increasing cough, congestion and generalized fatigue No fevers, no vomiting.  No hemoptysis.  He is reporting chest soreness after coughing.  He has an albuterol  inhaler which he uses sparingly. He is a non-smoker He reports sick contact    Past Medical History:  Diagnosis Date   Ampullary adenoma    Asthma    COVID-19 virus infection 09/15/2020   Diverticulosis    GERD (gastroesophageal reflux disease)    Knee pain     Home Medications Prior to Admission medications   Medication Sig Start Date End Date Taking? Authorizing Provider  predniSONE  (DELTASONE ) 50 MG tablet 1 tablet PO QD X4 days 01/07/24  Yes Eldon Greenland, MD  albuterol  (VENTOLIN  HFA) 108 (90 Base) MCG/ACT inhaler Inhale into the lungs.    [provider]  fluticasone  (FLONASE ) 50 MCG/ACT nasal spray Place 1 spray into both nostrils daily. 07/19/23   Debbra Fairy, PA-C  lidocaine  (LIDODERM ) 5 % Place 1 patch onto the skin daily. Remove & Discard patch within 12 hours or as directed by MD 11/25/23   Maralee Senate, April, MD      Allergies    Patient has no known allergies.    Review of Systems   Review of Systems  HENT:  Positive for congestion.   Respiratory:  Positive for cough and shortness of breath.     Physical Exam Updated Vital Signs BP (!) 154/92 (BP Location: Right Arm)   Pulse 71   Temp 97.7 F (36.5 C)   Resp 19   Ht 1.676 m (5\' 6" )   Wt 68 kg   SpO2 98%   BMI 24.21 kg/m  Physical Exam CONSTITUTIONAL: Well developed/well nourished HEAD: Normocephalic/atraumatic EYES: EOMI/PERRL ENMT:  Mucous membranes moist, uvula midline, no stridor, no drooling NECK: supple no meningeal signs CV: S1/S2 noted, no murmurs/rubs/gallops noted LUNGS: Tight scattered wheezing bilaterally, no acute distress ABDOMEN: soft, nontender NEURO: Pt is awake/alert/appropriate, moves all extremitiesx4.  No facial droop.   SKIN: warm, color normal PSYCH: no abnormalities of mood noted, alert and oriented to situation  ED Results / Procedures / Treatments   Labs (all labs ordered are listed, but only abnormal results are displayed) Labs Reviewed  BASIC METABOLIC PANEL WITH GFR - Abnormal; Notable for the following components:      Result Value   Glucose, Bld 115 (*)    Calcium  8.8 (*)    All other components within normal limits  RESP PANEL BY RT-PCR (RSV, FLU A&B, COVID)  [id number]  CBC  TROPONIN I (HIGH SENSITIVITY)    EKG EKG Interpretation Date/Time:  Monday January 07 2024 04:14:10 EDT Ventricular Rate:  73 PR Interval:  180 QRS Duration:  72 QT Interval:  396 QTC Calculation: 436 R Axis:   69  Text Interpretation: Normal sinus rhythm with sinus arrhythmia Normal ECG No significant change since last tracing Confirmed by Eldon Greenland ([id number]) on 01/07/2024 4:31:14 AM  Radiology DG Chest 2 View Result Date: 01/07/2024 CLINICAL DATA:  Chest pain and congestion. EXAM: CHEST - 2 VIEW  COMPARISON:  07/19/2023 FINDINGS: The lungs are clear without focal pneumonia, edema, pneumothorax or pleural effusion. The cardiopericardial silhouette is within normal limits for size. No acute bony abnormality. Telemetry leads overlie the chest. IMPRESSION: No active cardiopulmonary disease. Electronically Signed   By: Donnal Fusi M.D.   On: 01/07/2024 05:23    Procedures Procedures    Medications Ordered in ED Medications  ipratropium-albuterol  (DUONEB) 0.5-2.5 (3) MG/3ML nebulizer solution 3 mL (3 mLs Nebulization Given 01/07/24 0445)  dexamethasone  (DECADRON ) tablet 10 mg (10 mg Oral Given 01/07/24  0444)    ED Course/ Medical Decision Making/ A&P Clinical Course as of 01/07/24 0554  Mon Jan 07, 2024  0548 Patient presents with increasing cough for the past week.  He reports multiple sick contacts from his family.  Overall workup was unremarkable.  He reports he did have chest pain but was mostly sore from coughing.  He likely has a viral illness that is triggered bronchospasm from his history of asthma.  Patient feels much improved after albuterol  and Decadron  [DW]  0548 He has been advised to use his albuterol  MDI 2 puffs every 4 hours for next 2 days.  Will also start him on a burst of steroids.  He can follow with his PCP in a week if no improvement [DW]    Clinical Course User Index [DW] Eldon Greenland, MD                                 Medical Decision Making Amount and/or Complexity of Data Reviewed Labs: ordered. Radiology: ordered.  Risk Prescription drug management.   This patient presents to the ED for concern of cough and shortness of breath, this involves an extensive number of treatment options, and is a complaint that carries with it a high risk of complications and morbidity.  The differential diagnosis includes but is not limited to Acute coronary syndrome, pneumonia, acute pulmonary edema, pneumothorax, acute anemia, pulmonary embolism, asthma exacerbation    Comorbidities that complicate the patient evaluation: Patient's presentation is complicated by their history of asthma  Social Determinants of Health: Patient's  multiple ER visits   increases the complexity of managing their presentation  Additional history obtained: Records reviewed Primary Care Documents  Lab Tests: I Ordered, and personally interpreted labs.  The pertinent results include: No acute findings  Imaging Studies ordered: I ordered imaging studies including X-ray chest   I independently visualized and interpreted imaging which showed no acute findings I agree with the radiologist  interpretation   Medicines ordered and prescription drug management: I ordered medication including albuterol  and steroids for cough and wheeze Reevaluation of the patient after these medicines showed that the patient    improved   Reevaluation: After the interventions noted above, I reevaluated the patient and found that they have :improved  Complexity of problems addressed: Patient's presentation is most consistent with  acute presentation with potential threat to life or bodily function  Disposition: After consideration of the diagnostic results and the patient's response to treatment,  I feel that the patent would benefit from discharge   .           Final Clinical Impression(s) / ED Diagnoses Final diagnoses:  Acute bronchospasm due to viral infection    Rx / DC Orders ED Discharge Orders          Ordered    predniSONE  (DELTASONE ) 50 MG tablet  01/07/24 0542              Eldon Greenland, MD 01/07/24 [id number]

## 2024-01-07 NOTE — Discharge Instructions (Signed)
 You can take 2 puffs of your albuterol  every 4-6 hours for the next 2 days You can start the steroid prescription I have given you tomorrow Follow-up with Dr. Brent Cambric in 1 week if no improvement

## 2024-01-07 NOTE — ED Triage Notes (Addendum)
 Pt c/o nasal congestion, nausea, and cough x 1 week.  Pt c/o chest pain and shortness of breath.

## 2024-01-15 ENCOUNTER — Ambulatory Visit: Admitting: Orthopaedic Surgery

## 2024-01-15 DIAGNOSIS — M1711 Unilateral primary osteoarthritis, right knee: Secondary | ICD-10-CM

## 2024-01-15 DIAGNOSIS — M1712 Unilateral primary osteoarthritis, left knee: Secondary | ICD-10-CM

## 2024-01-15 DIAGNOSIS — M170 Bilateral primary osteoarthritis of knee: Secondary | ICD-10-CM

## 2024-01-15 NOTE — Progress Notes (Signed)
 Office Visit Note   Patient: Zachary [name]           Date of Birth: 1960-09-15           MRN: [id number] Visit Date: 01/15/2024              Requested by: Diedra Fowler, MD [address] [place],  [place] [address] PCP: Vernell Goldsmith, MD   Assessment & Plan: Visit Diagnoses:  1. Primary osteoarthritis of right knee   2. Primary osteoarthritis of left knee     Plan: History of Present Illness Zachary [name] is a 63 year old male with advanced arthritis bilateral knees who presents with worsening knee pain.  He experiences worsening pain in his right knee, which is more severe than the left. The sensation is as if the knee 'wants to give out', and the pain sometimes disrupts his sleep. Cortisone injections provided relief for only one day.  He has undergone arthroscopic surgery on both knees, with two procedures on the right knee and one on the left in the early 1990s. Despite these interventions, he continues to experience significant discomfort, particularly in the right knee, described as 'bone to bone'.  He manages the pain with Tylenol  and Advil . The left knee was previously swollen, but the primary concern remains the right knee, which affects his mobility and daily activities. No other symptoms beyond knee pain and occasional swelling.  Exams of bilateral knees show mild varus alignment of the proximal tibia.  Trace joint effusion.  Range of motion is 0 to 110 degrees with pain and crepitus.  Collaterals and cruciates are stable.  Assessment and Plan Advanced osteoarthritis of knees Advanced osteoarthritis in both knees, right knee more symptomatic. Bone-on-bone degeneration with instability and night pain. Previous cortisone injections provided temporary relief. Knee replacement recommended when symptoms are intolerable. - Provided handout on knee replacement surgery. - Advised to call when ready for knee replacement surgery.  Follow-Up Instructions: No  follow-ups on file.   Orders:  No orders of the defined types were placed in this encounter.  No orders of the defined types were placed in this encounter.     Procedures: No procedures performed   Clinical Data: No additional findings.   Subjective: Chief Complaint  Patient presents with   Right Knee - Pain   Left Knee - Pain    HPI  Review of Systems  Constitutional: Negative.   HENT: Negative.    Eyes: Negative.   Respiratory: Negative.    Cardiovascular: Negative.   Gastrointestinal: Negative.   Endocrine: Negative.   Genitourinary: Negative.   Skin: Negative.   Allergic/Immunologic: Negative.   Neurological: Negative.   Hematological: Negative.   Psychiatric/Behavioral: Negative.    All other systems reviewed and are negative.    Objective: Vital Signs: There were no vitals taken for this visit.  Physical Exam Vitals and nursing note reviewed.  Constitutional:      Appearance: He is well-developed.  HENT:     Head: Normocephalic and atraumatic.  Eyes:     Pupils: Pupils are equal, round, and reactive to light.  Pulmonary:     Effort: Pulmonary effort is normal.  Abdominal:     Palpations: Abdomen is soft.  Musculoskeletal:        General: Normal range of motion.     Cervical back: Neck supple.  Skin:    General: Skin is warm.  Neurological:     Mental Status: He is alert and  oriented to person, place, and time.  Psychiatric:        Behavior: Behavior normal.        Thought Content: Thought content normal.        Judgment: Judgment normal.     Ortho Exam  Specialty Comments:  No specialty comments available.  Imaging: No results found.   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   Anxiety and depression 12/27/2021   Allergic rhinitis 11/15/2020   Inguinal hernia of right side without obstruction or gangrene  03/17/2020   Periodontal disease 03/17/2020   Dental caries 03/17/2020   Mild intermittent asthma without complication 03/17/2020   Gastroesophageal reflux disease without esophagitis 03/17/2020   Chronic pain of right knee 03/17/2020   Past Medical History:  Diagnosis Date   Ampullary adenoma    Asthma    COVID-19 virus infection 09/15/2020   Diverticulosis    GERD (gastroesophageal reflux disease)    Knee pain     Family History  Problem Relation Age of Onset   Hypertension Mother    Cancer Father    Colon cancer Maternal Uncle    Colon cancer Cousin    Cancer Other    Esophageal cancer Neg Hx    Inflammatory bowel disease Neg Hx    Liver disease Neg Hx    Pancreatic cancer Neg Hx    Rectal cancer Neg Hx    Stomach cancer Neg Hx     Past Surgical History:  Procedure Laterality Date   BIOPSY  06/07/2022   Procedure: BIOPSY;  Surgeon: Normie Becton., MD;  Location: [facility];  Service: Gastroenterology;;   ENDOSCOPIC RETROGRADE CHOLANGIOPANCREATOGRAPHY (ERCP) WITH PROPOFOL  N/A 06/27/2022   Procedure: ENDOSCOPIC RETROGRADE CHOLANGIOPANCREATOGRAPHY (ERCP) WITH PROPOFOL ;  Surgeon: Normie Becton., MD;  Location: [facility];  Service: Gastroenterology;  Laterality: N/A;   ESOPHAGOGASTRODUODENOSCOPY (EGD) WITH PROPOFOL  N/A 06/07/2022   Procedure: ESOPHAGOGASTRODUODENOSCOPY (EGD) WITH PROPOFOL ;  Surgeon: Brice Campi Albino Alu., MD;  Location: [facility];  Service: Gastroenterology;  Laterality: N/A;   ESOPHAGOGASTRODUODENOSCOPY ENDOSCOPY  10/11/2021   EUS N/A 06/07/2022   Procedure: UPPER ENDOSCOPIC ULTRASOUND (EUS) RADIAL;  Surgeon: Normie Becton., MD;  Location: [facility];  Service: Gastroenterology;  Laterality: N/A;   KNEE ARTHROSCOPY     x 2   RADIOLOGY WITH ANESTHESIA N/A 03/28/2022   Procedure: MRI ABDOMEN MRCP WITH AND WITHOUT CONTRAST WITH ANESTHESIA;  Surgeon: Radiologist, Medication, MD;  Location: [facility];  Service: Radiology;   Laterality: N/A;   UPPER GASTROINTESTINAL ENDOSCOPY  12/29/2021   Dr.Ying Rosaline Coma   Social History   Occupational History   Not on file  Tobacco Use   Smoking status: Former   Smokeless tobacco: Never  Vaping Use   Vaping status: Never Used  Substance and Sexual Activity   Alcohol use: No   Drug use: No   Sexual activity: Yes

## 2024-02-25 ENCOUNTER — Emergency Department (HOSPITAL_COMMUNITY)
Admission: EM | Admit: 2024-02-25 | Discharge: 2024-02-25 | Disposition: A | Discharge status: Home / Self Care | Attending: Emergency Medicine | Admitting: Emergency Medicine

## 2024-02-25 ENCOUNTER — Emergency Department (HOSPITAL_COMMUNITY)

## 2024-02-25 ENCOUNTER — Other Ambulatory Visit: Payer: Self-pay

## 2024-02-25 DIAGNOSIS — S99922A Unspecified injury of left foot, initial encounter: Secondary | ICD-10-CM | POA: Diagnosis present

## 2024-02-25 DIAGNOSIS — S9032XA Contusion of left foot, initial encounter: Secondary | ICD-10-CM | POA: Diagnosis not present

## 2024-02-25 DIAGNOSIS — Y990 Civilian activity done for income or pay: Secondary | ICD-10-CM | POA: Insufficient documentation

## 2024-02-25 DIAGNOSIS — W208XXA Other cause of strike by thrown, projected or falling object, initial encounter: Secondary | ICD-10-CM | POA: Insufficient documentation

## 2024-02-25 MED ORDER — NAPROXEN 500 MG PO TABS: 500.0000 mg | ORAL_TABLET | Freq: Two times a day (BID) | ORAL | 0 refills | Status: DC

## 2024-02-25 MED ORDER — NAPROXEN 250 MG PO TABS
500.0000 mg | ORAL_TABLET | Freq: Once | ORAL | Status: AC
  Administered 2024-02-25: 500 mg via ORAL
  Filled 2024-02-25: qty 2

## 2024-02-25 NOTE — ED Provider Notes (Addendum)
  [place] [facility] DEPARTMENT AT [facility] Provider Note   CSN: [id number] Arrival date & time: 02/25/24  1712     Patient presents with: Foot Injury   Zachary [name] is a 63 y.o. male.    Foot Injury  63 year old male, no significant chronic medical history presenting with a left foot injury.  This occurred just prior to arrival when a coworker dropped a full sheet of plywood on his left foot, this injured him across the dorsal aspect of his distal foot at the base of the toes.  The pain is primarily focused around his great toe.  He denies any obvious bleeding bruising or swelling but has tenderness with manipulation of the left great toe.  No anticoagulants    Prior to Admission medications   Medication Sig Start Date End Date Taking? Authorizing Provider  naproxen  (NAPROSYN ) 500 MG tablet Take 1 tablet (500 mg total) by mouth 2 (two) times daily with a meal. 02/25/24  Yes Early Glisson, MD  albuterol  (VENTOLIN  HFA) 108 (90 Base) MCG/ACT inhaler Inhale into the lungs.    [provider]  fluticasone  (FLONASE ) 50 MCG/ACT nasal spray Place 1 spray into both nostrils daily. 07/19/23   Debbra Fairy, PA-C  lidocaine  (LIDODERM ) 5 % Place 1 patch onto the skin daily. Remove & Discard patch within 12 hours or as directed by MD 11/25/23   Maralee Senate, April, MD  predniSONE  (DELTASONE ) 50 MG tablet 1 tablet PO QD X4 days 01/07/24   Eldon Greenland, MD    Allergies: Patient has no known allergies.    Review of Systems  All other systems reviewed and are negative.   Updated Vital Signs BP 117/69 (BP Location: Right Arm)   Pulse 90   Temp 98.1 F (36.7 C)   Resp 16   SpO2 98%   Physical Exam Vitals and nursing note reviewed.  Constitutional:      Appearance: He is well-developed. He is not diaphoretic.  HENT:     Head: Normocephalic and atraumatic.   Eyes:     General:        Right eye: No discharge.        Left eye: No discharge.     Conjunctiva/sclera:  Conjunctivae normal.   Pulmonary:     Effort: Pulmonary effort is normal. No respiratory distress.   Musculoskeletal:     Comments: Mild tenderness at the base of the great toe, no other obvious swelling or tenderness to the foot, pulses are normal and cap refill is normal to the distal toes, good range of motion of the ankle   Skin:    General: Skin is warm and dry.     Findings: No erythema or rash.     Comments: No bruising, no bleeding, no obvious swelling   Neurological:     Mental Status: He is alert.     Coordination: Coordination normal.     Comments: Normal sensation to the toes    (all labs ordered are listed, but only abnormal results are displayed) Labs Reviewed - No data to display  EKG: None  Radiology: DG Foot Complete Left Result Date: 02/25/2024 CLINICAL DATA:  Left foot pain. EXAM: LEFT FOOT - COMPLETE 3+ VIEW COMPARISON:  None Available. FINDINGS: There is no evidence of an acute fracture or dislocation. A mild hallux valgus deformity is noted. Soft tissue swelling is seen adjacent to the first metatarsophalangeal joint. IMPRESSION: Mild hallux valgus deformity without an acute osseous abnormality. Electronically Signed  By: Virgle Grime M.D.   On: 02/25/2024 18:53     Procedures   Medications Ordered in the ED  naproxen  (NAPROSYN ) tablet 500 mg (has no administration in time range)                                    Medical Decision Making Risk Prescription drug management.   Patient is well-appearing but foot x-ray shows some slight hallux valgus deformity but no acute fractures.  Patient was informed of his results including no signs of obvious fractures, he will be outfitted with a postop shoe, given an anti-inflammatory and encouraged to follow-up as needed.  The patient is agreeable to the plan, RICE therapy, vitals normal  I have discussed with the patient at the bedside the results, and the meaning of these results.  They have had  opportunity to ask questions,  expressed their understanding to the need for follow-up with primary care physician.     Final diagnoses:  Contusion of left foot, initial encounter    ED Discharge Orders          Ordered    naproxen  (NAPROSYN ) 500 MG tablet  2 times daily with meals        02/25/24 2214               Early Glisson, MD 02/25/24 2216    Early Glisson, MD 02/25/24 2216

## 2024-02-25 NOTE — Discharge Instructions (Addendum)
 Your xray is normal No broken bones  RICE therapy:  Apply ice wrapped in a towel intermittently keeping it on the skin no longer than 10 minutes a couple of times an hour  Elevate the affected extremity to help reduce blood flow and prevent swelling  Use an anti-inflammatory if you are not allergic to it such as ibuprofen  or Naprosyn  to help with pain and swelling  Use a compressive device whether it is an Ace wrap or other  immobilizer to help minimize movement and compress the swelling.  Please take Naprosyn , 500mg  by mouth twice daily as needed for pain - this in an antiinflammatory medicine (NSAID) and is similar to ibuprofen  - many people feel that it is stronger than ibuprofen  and it is easier to take since it is a smaller pill.  Please use this only for 1 week - if your pain persists, you will need to follow up with your doctor in the office for ongoing guidance and pain control.  Thank you for allowing us  to treat you in the emergency department today.  After reviewing your examination and potential testing that was done it appears that you are safe to go home.  I would like for you to follow-up with your doctor within the next several days, have them obtain your records and follow-up with them to review all potential tests and results from your visit.  If you should develop severe or worsening symptoms return to the emergency department immediately

## 2024-02-25 NOTE — ED Provider Triage Note (Signed)
 Emergency Medicine Provider Triage Evaluation Note  Zachary [name] , a 63 y.o. male  was evaluated in triage.  Pt complains of left foot injury.  Wooden board dropped on it while at work.  Review of Systems  Positive: Pain, swelling, injury Negative: Numbness, weakness  Physical Exam  BP 117/69 (BP Location: Right Arm)   Pulse 90   Temp 98.1 F (36.7 C)   Resp 16   SpO2 98%  Gen:   Awake, no distress   Resp:  Normal effort  MSK:   Moves extremities without difficulty Other:  Mild swelling to left 2nd through 5th toes.  Patient neurovascularly intact, +2 dorsalis pedis pulse  Medical Decision Making  Medically screening exam initiated at 5:32 PM.  Appropriate orders placed.  Sheryl Donna was informed that the remainder of the evaluation will be completed by another provider, this initial triage assessment does not replace that evaluation, and the importance of remaining in the ED until their evaluation is complete.  Imaging ordered   Carie Charity, PA-C 02/25/24 1733

## 2024-02-25 NOTE — ED Triage Notes (Signed)
 Patient c/o foot pain and swelling after coworker dropped a board on it at work. Patient has noted swelling of the toes on his left foot, pulses palpated and strong. Patient able to move toes on the affected foot.

## 2024-03-07 ENCOUNTER — Emergency Department (HOSPITAL_COMMUNITY)
Admission: EM | Admit: 2024-03-07 | Discharge: 2024-03-08 | Discharge status: Against Medical Advice | Attending: Emergency Medicine | Admitting: Emergency Medicine

## 2024-03-07 DIAGNOSIS — M79672 Pain in left foot: Secondary | ICD-10-CM | POA: Insufficient documentation

## 2024-03-07 DIAGNOSIS — Z5321 Procedure and treatment not carried out due to patient leaving prior to being seen by health care provider: Secondary | ICD-10-CM | POA: Insufficient documentation

## 2024-03-07 DIAGNOSIS — W208XXA Other cause of strike by thrown, projected or falling object, initial encounter: Secondary | ICD-10-CM | POA: Diagnosis not present

## 2024-03-07 NOTE — ED Notes (Signed)
Pt called for triage, no response. 

## 2024-03-08 ENCOUNTER — Other Ambulatory Visit: Payer: Self-pay

## 2024-03-08 ENCOUNTER — Encounter (HOSPITAL_COMMUNITY): Payer: Self-pay | Admitting: *Deleted

## 2024-03-08 NOTE — ED Notes (Signed)
Pt was called x 3 No answer

## 2024-03-08 NOTE — ED Triage Notes (Signed)
 Pt c/o lt foot pain  he was here a week ago after he dropped an object on it  he had a boot placed but he is not wearing it  c/o pain

## 2024-03-11 ENCOUNTER — Other Ambulatory Visit: Payer: Self-pay

## 2024-03-11 ENCOUNTER — Emergency Department (HOSPITAL_COMMUNITY)
Admission: EM | Admit: 2024-03-11 | Discharge: 2024-03-12 | Disposition: A | Discharge status: Home / Self Care | Attending: Emergency Medicine | Admitting: Emergency Medicine

## 2024-03-11 ENCOUNTER — Encounter (HOSPITAL_COMMUNITY): Payer: Self-pay

## 2024-03-11 ENCOUNTER — Emergency Department (HOSPITAL_COMMUNITY)

## 2024-03-11 DIAGNOSIS — M79672 Pain in left foot: Secondary | ICD-10-CM | POA: Diagnosis present

## 2024-03-11 MED ORDER — MELOXICAM 7.5 MG PO TABS: 7.5000 mg | ORAL_TABLET | Freq: Every day | ORAL | 0 refills | Status: AC

## 2024-03-11 MED ORDER — LIDOCAINE 5 % EX PTCH: 1.0000 | MEDICATED_PATCH | CUTANEOUS | 0 refills | Status: AC

## 2024-03-11 NOTE — ED Provider Notes (Signed)
 [place] EMERGENCY DEPARTMENT AT [facility] Provider Note   CSN: [id number] Arrival date & time: 03/11/24  1956     Patient presents with: Foot Pain   Zachary [name] is a 63 y.o. male here for eval of foot pain.  2 weeks ago he dropped plank of plywood on the dorsum of his foot.  Since then he has had pain feeling.  He feels like his foot is swollen.  No numbness or weakness.  Has been trying to rest, elevate, ice the foot.  He is able to ambulate without difficulty.   HPI     Prior to Admission medications   Medication Sig Start Date End Date Taking? Authorizing Provider  lidocaine  (LIDODERM ) 5 % Place 1 patch onto the skin daily. Remove & Discard patch within 12 hours or as directed by MD 03/11/24  Yes Chibuike Fleek A, PA-C  meloxicam  (MOBIC ) 7.5 MG tablet Take 1 tablet (7.5 mg total) by mouth daily. 03/11/24  Yes Emilija Bohman A, PA-C  albuterol  (VENTOLIN  HFA) 108 (90 Base) MCG/ACT inhaler Inhale into the lungs.    [provider]  fluticasone  (FLONASE ) 50 MCG/ACT nasal spray Place 1 spray into both nostrils daily. 07/19/23   Nivia Colon, PA-C  predniSONE  (DELTASONE ) 50 MG tablet 1 tablet PO QD X4 days 01/07/24   Midge Golas, MD    Allergies: Patient has no known allergies.    Review of Systems  Constitutional: Negative.   HENT: Negative.    Respiratory: Negative.    Cardiovascular: Negative.   Gastrointestinal: Negative.   Genitourinary: Negative.   Musculoskeletal:        Left foot pain  Skin: Negative.   Neurological: Negative.   All other systems reviewed and are negative.   Updated Vital Signs BP 122/74 (BP Location: Left Arm)   Pulse 74   Temp 98.5 F (36.9 C) (Oral)   Resp 18   Ht 5' 6 (1.676 m)   Wt 70.8 kg   SpO2 97%   BMI 25.18 kg/m   Physical Exam Vitals and nursing note reviewed.  Constitutional:      General: He is not in acute distress.    Appearance: He is well-developed. He is not ill-appearing,  toxic-appearing or diaphoretic.  HENT:     Head: Normocephalic and atraumatic.  Eyes:     Pupils: Pupils are equal, round, and reactive to light.  Cardiovascular:     Rate and Rhythm: Normal rate and regular rhythm.     Pulses:          Dorsalis pedis pulses are 2+ on the right side and 2+ on the left side.  Pulmonary:     Effort: Pulmonary effort is normal. No respiratory distress.  Abdominal:     General: There is no distension.     Palpations: Abdomen is soft.  Musculoskeletal:        General: Normal range of motion.     Cervical back: Normal range of motion and neck supple.     Comments: Diffuse tenderness to the dorsum of his left foot.  No crepitus, step-off.  He was able to plantarflex and dorsiflex without difficulty.  Skin:    General: Skin is warm and dry.     Capillary Refill: Capillary refill takes less than 2 seconds.     Comments: No edema, erythema, warmth, rashes or lesions.  Compartments are soft  Neurological:     General: No focal deficit present.     Mental Status:  He is alert and oriented to person, place, and time.     Sensory: Sensation is intact.     Motor: Motor function is intact.     Gait: Gait is intact.     (all labs ordered are listed, but only abnormal results are displayed) Labs Reviewed - No data to display  EKG: None  Radiology: No results found.   Procedures   Medications Ordered in the ED - No data to display 63 year old here for vaginal left foot pain.  Injury 2 weeks ago when he dropped a plywood on the dorsum of his foot.  Was seen at that time and had negative x-rays.  He continues to have pain.  He feels like he has intermittent swelling.  Here he is neurovascularly intact.  He has no obvious skin changes to the foot.  His compartments are soft.  No obvious edema.  No rashes or lesions.  Given negative x-ray we will get CT scan to ensure no occult injury, Lisfranc injury.  Imaging personally viewed interpreted CT left foot  pending   Care transferred to [facility] who will fu on imaging and dispo   Clinical Course as of 03/11/24 2359  Tue Mar 11, 2024  2354 L foot pain, getting CT scan. Dispo ready. If negative. Going home with lidocaine  patches and mobic .  [CB]    Clinical Course User Index [CB] Beola Terrall RAMAN, PA-C                                 Medical Decision Making Amount and/or Complexity of Data Reviewed Independent Historian:     Details: Family at bedside External Data Reviewed: labs, radiology and notes. Radiology: ordered and independent interpretation performed. Decision-making details documented in ED Course.  Risk OTC drugs. Prescription drug management. Decision regarding hospitalization. Diagnosis or treatment significantly limited by social determinants of health.        Final diagnoses:  Left foot pain    ED Discharge Orders          Ordered    meloxicam  (MOBIC ) 7.5 MG tablet  Daily        03/11/24 2350    lidocaine  (LIDODERM ) 5 %  Every 24 hours        03/11/24 2350               Izan Miron A, PA-C 03/11/24 2359    Laurice Maude BROCKS, MD 03/13/24 [phone]

## 2024-03-11 NOTE — ED Triage Notes (Signed)
 Left foot pain for 2 weeks, pt dropped plywood on foot. Pt had negative xray last week. C/o swelling and pain persisting even after icing, elevating, and resting foot

## 2024-03-11 NOTE — Discharge Instructions (Addendum)
 It was a pleasure taking care of you here today  Your imaging today is reassuring.  He still probably does have some mild inflammation of the injury  I have started on some anti-inflammatory medication.  Take as prescribed.  Make sure to follow-up outpatient, return for any worsening symptoms

## 2024-03-12 NOTE — ED Provider Notes (Signed)
  Physical Exam  BP 122/74 (BP Location: Left Arm)   Pulse 74   Temp 98.5 F (36.9 C) (Oral)   Resp 18   Ht 5' 6 (1.676 m)   Wt 70.8 kg   SpO2 97%   BMI 25.18 kg/m   Physical Exam Vitals and nursing note reviewed.  Constitutional:      General: He is not in acute distress.    Appearance: Normal appearance. He is not ill-appearing.  HENT:     Head: Normocephalic and atraumatic.  Eyes:     Extraocular Movements: Extraocular movements intact.     Conjunctiva/sclera: Conjunctivae normal.  Cardiovascular:     Rate and Rhythm: Normal rate and regular rhythm.  Pulmonary:     Effort: Pulmonary effort is normal. No respiratory distress.  Abdominal:     General: Abdomen is flat.     Palpations: Abdomen is soft.  Skin:    General: Skin is warm and dry.  Neurological:     General: No focal deficit present.     Mental Status: He is alert. Mental status is at baseline.  Psychiatric:        Mood and Affect: Mood normal.     Procedures  Procedures  ED Course / MDM   Clinical Course as of 03/12/24 0007  Tue Mar 11, 2024  2354 L foot pain, getting CT scan. Dispo ready. If negative. Going home with lidocaine  patches and mobic .  [CB]    Clinical Course User Index [CB] Beola Terrall RAMAN, PA-C   Medical Decision Making Amount and/or Complexity of Data Reviewed Radiology: ordered.  Risk Prescription drug management.   Patient care transferred over from Roy, Britni PA-C.  At time of handoff, awaiting CT and pending results.  Anticipated discharge with disposition already made, patient going home with lidocaine  patches and Mobic . Noted to have, initially with having dropped a plank of plywood on his foot 2 weeks ago, had significant pain and feels like her foot is swollen.  CT findings showed nondisplaced oblique fracture through the head of the proximal phalanx on the first toe of the left foot.  Will buddy tape and have the patient continue to ambulate as needed,  medications prescribed already by previous provider.  Will have him follow-up with orthopedics as needed.  Patient vital signs have remained stable throughout the course of patient's time in the ED. Low suspicion for any other emergent pathology at this time. I believe this patient is safe to be discharged. Provided strict return to ER precautions. Patient expressed agreement and understanding of plan. All questions were answered.      Beola Terrall RAMAN, PA-C 03/12/24 0014    Albertina Dixon, MD 03/12/24 [phone]

## 2024-07-12 ENCOUNTER — Emergency Department (HOSPITAL_COMMUNITY)
Admission: EM | Admit: 2024-07-12 | Discharge: 2024-07-13 | Disposition: A | Attending: Emergency Medicine | Admitting: Emergency Medicine

## 2024-07-12 ENCOUNTER — Emergency Department (HOSPITAL_COMMUNITY)

## 2024-07-12 ENCOUNTER — Encounter (HOSPITAL_COMMUNITY): Payer: Self-pay

## 2024-07-12 DIAGNOSIS — M79671 Pain in right foot: Secondary | ICD-10-CM | POA: Diagnosis present

## 2024-07-12 NOTE — ED Triage Notes (Signed)
 Patient dropped a door on the right foot today. Patient unsure if there is swelling her hasn't taken his shoe off. Patient able to put some weight on it but it hurts.

## 2024-07-13 MED ORDER — IBUPROFEN 800 MG PO TABS
800.0000 mg | ORAL_TABLET | Freq: Once | ORAL | Status: AC
  Administered 2024-07-13: 800 mg via ORAL
  Filled 2024-07-13: qty 1

## 2024-07-13 NOTE — ED Provider Notes (Signed)
 [place] EMERGENCY DEPARTMENT AT [facility] Provider Note   CSN: [id number] Arrival date & time: 07/12/24  2000     Patient presents with: Foot Pain (Right/)   Zachary [name] is a 63 y.o. male.   The history is provided by the patient.  Foot Pain This is a new problem. The current episode started 3 to 5 hours ago. The problem occurs constantly. The problem has not changed since onset.Pertinent negatives include no chest pain. Nothing aggravates the symptoms. Nothing relieves the symptoms. He has tried nothing for the symptoms. The treatment provided no relief.  Dropped door on the right foot.       Prior to Admission medications   Medication Sig Start Date End Date Taking? Authorizing Provider  albuterol  (VENTOLIN  HFA) 108 (90 Base) MCG/ACT inhaler Inhale into the lungs.    [provider]  fluticasone  (FLONASE ) 50 MCG/ACT nasal spray Place 1 spray into both nostrils daily. 07/19/23   Nivia Colon, PA-C  lidocaine  (LIDODERM ) 5 % Place 1 patch onto the skin daily. Remove & Discard patch within 12 hours or as directed by MD 03/11/24   Henderly, Britni A, PA-C  meloxicam  (MOBIC ) 7.5 MG tablet Take 1 tablet (7.5 mg total) by mouth daily. 03/11/24   Henderly, Britni A, PA-C  predniSONE  (DELTASONE ) 50 MG tablet 1 tablet PO QD X4 days 01/07/24   Midge Golas, MD    Allergies: Patient has no known allergies.    Review of Systems  Cardiovascular:  Negative for chest pain.  Musculoskeletal:  Positive for arthralgias.    Updated Vital Signs BP 122/76 (BP Location: Right Arm)   Pulse 88   Temp 98.1 F (36.7 C)   Resp 16   SpO2 93%   Physical Exam Vitals and nursing note reviewed.  Constitutional:      General: He is not in acute distress.    Appearance: He is well-developed. He is not diaphoretic.  HENT:     Head: Normocephalic and atraumatic.     Nose: Nose normal.  Eyes:     Conjunctiva/sclera: Conjunctivae normal.     Pupils: Pupils are equal,  round, and reactive to light.  Cardiovascular:     Rate and Rhythm: Normal rate and regular rhythm.     Pulses: Normal pulses.     Heart sounds: Normal heart sounds.  Pulmonary:     Effort: Pulmonary effort is normal.     Breath sounds: Normal breath sounds. No wheezing or rales.  Abdominal:     General: Bowel sounds are normal.     Palpations: Abdomen is soft.     Tenderness: There is no abdominal tenderness. There is no guarding or rebound.  Musculoskeletal:        General: Normal range of motion.     Cervical back: Normal range of motion and neck supple.     Right foot: Normal range of motion and normal capillary refill. No tenderness, bony tenderness or crepitus.  Skin:    General: Skin is warm and dry.     Capillary Refill: Capillary refill takes less than 2 seconds.  Neurological:     General: No focal deficit present.     Mental Status: He is alert and oriented to person, place, and time.     Deep Tendon Reflexes: Reflexes normal.     (all labs ordered are listed, but only abnormal results are displayed) Labs Reviewed - No data to display  EKG: None  Radiology: DG Foot Complete  Right Result Date: 07/12/2024 EXAM: 3 OR MORE VIEW(S) XRAY OF THE RIGHT FOOT 07/12/2024 09:38:00 PM COMPARISON: None available. CLINICAL HISTORY: Rod fell on right foot, crushing toes. FINDINGS: BONES AND JOINTS: No acute fracture. No focal osseous lesion. No joint dislocation. Mild degenerative changes at the 1st MTP joint with hallux valgus deformity. SOFT TISSUES: The soft tissues are unremarkable. IMPRESSION: 1. No acute fracture or dislocation. Electronically signed by: Franky Crease MD 07/12/2024 09:42 PM EDT RP Workstation: [id number]     Procedures   Medications Ordered in the ED - No data to display                                  Medical Decision Making Patient dropped door on foot   Amount and/or Complexity of Data Reviewed External Data Reviewed: notes.    Details: Previous  notes reviewed  Radiology: ordered.  Risk Prescription drug management. Risk Details: Well appearing.  FROM.  Ice and alternate tylenol  and ibuprofen .  Elevation.       Final diagnoses:  Foot pain, right     No signs of systemic illness or infection. The patient is nontoxic-appearing on exam and vital signs are within normal limits.  I have reviewed the triage vital signs and the nursing notes. Pertinent labs & imaging results that were available during my care of the patient were reviewed by me and considered in my medical decision making (see chart for details). After history, exam, and medical workup I feel the patient has been appropriately medically screened and is safe for discharge home. Pertinent diagnoses were discussed with the patient. Patient was given return precautions.    ED Discharge Orders     None          Osric Klopf, MD 07/13/24 9962

## 2024-07-14 ENCOUNTER — Encounter: Payer: Self-pay | Admitting: Radiology

## 2024-08-24 ENCOUNTER — Encounter: Payer: Self-pay | Admitting: Emergency Medicine

## 2024-08-24 ENCOUNTER — Ambulatory Visit: Admission: EM | Admit: 2024-08-24 | Discharge: 2024-08-24 | Disposition: A | Discharge status: Home / Self Care

## 2024-08-24 DIAGNOSIS — B356 Tinea cruris: Secondary | ICD-10-CM

## 2024-08-24 MED ORDER — CLOTRIMAZOLE 1 % EX CREA: TOPICAL_CREAM | CUTANEOUS | 0 refills | Status: AC

## 2024-08-24 NOTE — ED Triage Notes (Addendum)
 Pt presents c/o rash and groin concern x 2 days. Pt states,  I got a rash near my stuff (groin) and it gets warm down there at night. It just feels funny like different. I put some ointment on the side of it and it helped a lil bit Pt denies contact with any known allergens or any changes in personal products used daily. Pt also denies discharge.

## 2024-08-24 NOTE — ED Provider Notes (Signed)
 [facility]    CSN: [id number] Arrival date & time: 08/24/24  9175      History   Chief Complaint Chief Complaint  Patient presents with   Rash   Groin Pain    HPI Zachary [name] is a 63 y.o. male.   Pt presents today due to erythematous eruption of penis and groin for the past 2 days. Pt states that he has had these symptoms in the past and was prescribed a cream. 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 Pt states that he is using gold bond powder and 1% hydrocortisone cream with some relief but wanted it to be evaluated to be sure it was nothing more. Pt denies concern for STIs.   The history is provided by the patient.  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   Anxiety and depression 12/27/2021   Allergic rhinitis 11/15/2020   Inguinal hernia of right side without obstruction or gangrene 03/17/2020   Periodontal disease 03/17/2020   Dental caries 03/17/2020   Mild intermittent asthma without complication 03/17/2020   Gastroesophageal reflux disease without esophagitis 03/17/2020   Chronic pain of right knee 03/17/2020    Past Surgical History:  Procedure Laterality Date   BIOPSY  06/07/2022   Procedure: BIOPSY;  Surgeon: Wilhelmenia Aloha Raddle., MD;  Location: [facility];  Service: Gastroenterology;;   ENDOSCOPIC RETROGRADE CHOLANGIOPANCREATOGRAPHY (ERCP) WITH PROPOFOL  N/A 06/27/2022   Procedure: ENDOSCOPIC RETROGRADE CHOLANGIOPANCREATOGRAPHY (ERCP) WITH PROPOFOL ;  Surgeon: Wilhelmenia Aloha Raddle., MD;  Location: [facility];  Service: Gastroenterology;  Laterality: N/A;    ESOPHAGOGASTRODUODENOSCOPY (EGD) WITH PROPOFOL  N/A 06/07/2022   Procedure: ESOPHAGOGASTRODUODENOSCOPY (EGD) WITH PROPOFOL ;  Surgeon: Wilhelmenia Aloha Raddle., MD;  Location: [facility];  Service: Gastroenterology;  Laterality: N/A;   ESOPHAGOGASTRODUODENOSCOPY ENDOSCOPY  10/11/2021   EUS N/A 06/07/2022   Procedure: UPPER ENDOSCOPIC ULTRASOUND (EUS) RADIAL;  Surgeon: Wilhelmenia Aloha Raddle., MD;  Location: [facility];  Service: Gastroenterology;  Laterality: N/A;   KNEE ARTHROSCOPY     x 2   RADIOLOGY WITH ANESTHESIA N/A 03/28/2022   Procedure: MRI ABDOMEN MRCP WITH AND WITHOUT CONTRAST WITH ANESTHESIA;  Surgeon: Radiologist, Medication, MD;  Location: [facility];  Service: Radiology;  Laterality: N/A;   UPPER GASTROINTESTINAL ENDOSCOPY  12/29/2021   Dr.Ying Federico       Home Medications    Prior to Admission medications  Medication Sig Start Date End Date Taking? Authorizing Provider  clotrimazole  (LOTRIMIN ) 1 % cream Apply to affected area 2 times daily 08/24/24  Yes Andra Corean BROCKS, PA-C  omeprazole  (PRILOSEC) 40 MG capsule Take 40 mg by mouth. 06/27/22  Yes [provider]  oxyCODONE  (OXY IR/ROXICODONE ) 5 MG immediate release tablet Take 5 mg by mouth. 01/08/23  Yes [provider]  albuterol  (VENTOLIN  HFA) 108 (90 Base) MCG/ACT inhaler Inhale into the lungs.    [provider]  fluticasone  (FLONASE ) 50 MCG/ACT nasal spray Place 1 spray into both nostrils daily. 07/19/23   Nivia Colon, PA-C  lidocaine  (LIDODERM ) 5 % Place 1 patch onto the skin daily. Remove & Discard patch within 12 hours  or as directed by MD 03/11/24   Henderly, Britni A, PA-C  meloxicam  (MOBIC ) 7.5 MG tablet Take 1 tablet (7.5 mg total) by mouth daily. 03/11/24   Henderly, Britni A, PA-C  predniSONE  (DELTASONE ) 50 MG tablet 1 tablet PO QD X4 days 01/07/24   Midge Golas, MD    Family History Family History  Problem Relation Age of Onset   Hypertension Mother    Cancer Father     Colon cancer Maternal Uncle    Colon cancer Cousin    Cancer Other    Esophageal cancer Neg Hx    Inflammatory bowel disease Neg Hx    Liver disease Neg Hx    Pancreatic cancer Neg Hx    Rectal cancer Neg Hx    Stomach cancer Neg Hx     Social History Social History[1]   Allergies   Patient has no known allergies.   Review of Systems Review of Systems  Skin:  Positive for rash.     Physical Exam Triage Vital Signs ED Triage Vitals  Encounter Vitals Group     BP 08/24/24 0842 (!) 152/84     Girls Systolic BP Percentile --      Girls Diastolic BP Percentile --      Boys Systolic BP Percentile --      Boys Diastolic BP Percentile --      Pulse Rate 08/24/24 0842 66     Resp 08/24/24 0842 18     Temp 08/24/24 0842 98.2 F (36.8 C)     Temp Source 08/24/24 0842 Oral     SpO2 08/24/24 0842 96 %     Weight 08/24/24 0841 156 lb 1.4 oz (70.8 kg)     Height --      Head Circumference --      Peak Flow --      Pain Score 08/24/24 0840 7     Pain Loc --      Pain Education --      Exclude from Growth Chart --    No data found.  Updated Vital Signs BP (!) 152/84 (BP Location: Left Arm)   Pulse 66   Temp 98.2 F (36.8 C) (Oral)   Resp 18   Wt 156 lb 1.4 oz (70.8 kg)   SpO2 96%   BMI 25.19 kg/m   Visual Acuity Right Eye Distance:   Left Eye Distance:   Bilateral Distance:    Right Eye Near:   Left Eye Near:    Bilateral Near:     Physical Exam Vitals and nursing note reviewed.  Constitutional:      General: He is not in acute distress.    Appearance: Normal appearance. He is not ill-appearing, toxic-appearing or diaphoretic.  Eyes:     General: No scleral icterus. Cardiovascular:     Rate and Rhythm: Normal rate and regular rhythm.     Heart sounds: Normal heart sounds.  Pulmonary:     Effort: Pulmonary effort is normal. No respiratory distress.     Breath sounds: Normal breath sounds. No wheezing or rhonchi.  Genitourinary:    Comments:  Erythematous eruption noted of distal shaft of penis Skin:    General: Skin is warm.  Neurological:     Mental Status: He is alert and oriented to person, place, and time.  Psychiatric:        Mood and Affect: Mood normal.        Behavior: Behavior normal.      UC  Treatments / Results  Labs (all labs ordered are listed, but only abnormal results are displayed) Labs Reviewed - No data to display  EKG   Radiology No results found.  Procedures Procedures (including critical care time)  Medications Ordered in UC Medications - No data to display  Initial Impression / Assessment and Plan / UC Course  I have reviewed the triage vital signs and the nursing notes.  Pertinent labs & imaging results that were available during my care of the patient were reviewed by me and considered in my medical decision making (see chart for details).    Final Clinical Impressions(s) / UC Diagnoses   Final diagnoses:  Tinea cruris   Discharge Instructions   None    ED Prescriptions     Medication Sig Dispense Auth. Provider   clotrimazole  (LOTRIMIN ) 1 % cream Apply to affected area 2 times daily 15 g Andra Corean BROCKS, PA-C      PDMP not reviewed this encounter.    [1]  Social History Tobacco Use   Smoking status: Former    Passive exposure: Current   Smokeless tobacco: Never  Vaping Use   Vaping status: Never Used  Substance Use Topics   Alcohol use: No   Drug use: No     Andra Corean BROCKS, PA-C 08/24/24 [phone]
